# Patient Record
Sex: Female | Born: 1950
Health system: Southern US, Community
[De-identification: ages and names within clinical notes are randomized; demographics above are authoritative.]

## PROBLEM LIST (undated history)

## (undated) DIAGNOSIS — C50919 Malignant neoplasm of unspecified site of unspecified female breast: Secondary | ICD-10-CM

## (undated) DIAGNOSIS — I272 Pulmonary hypertension, unspecified: Secondary | ICD-10-CM

## (undated) DIAGNOSIS — I10 Essential (primary) hypertension: Secondary | ICD-10-CM

## (undated) DIAGNOSIS — E119 Type 2 diabetes mellitus without complications: Secondary | ICD-10-CM

## (undated) HISTORY — PX: KNEE SURGERY: SHX244

## (undated) HISTORY — DX: Pulmonary hypertension, unspecified: I27.20

## (undated) HISTORY — PX: APPENDECTOMY: SHX54

## (undated) HISTORY — PX: HERNIA REPAIR: SHX51

## (undated) HISTORY — PX: ELBOW SURGERY: SHX618

## (undated) HISTORY — PX: ABDOMINAL HYSTERECTOMY: SHX81

## (undated) HISTORY — PX: BREAST LUMPECTOMY: SHX2

---

## 2009-03-24 ENCOUNTER — Ambulatory Visit: Payer: Self-pay | Admitting: Diagnostic Radiology

## 2009-03-24 ENCOUNTER — Emergency Department (HOSPITAL_BASED_OUTPATIENT_CLINIC_OR_DEPARTMENT_OTHER): Admission: EM | Admit: 2009-03-24 | Discharge: 2009-03-24 | Payer: Self-pay | Admitting: Emergency Medicine

## 2010-04-05 ENCOUNTER — Emergency Department (HOSPITAL_BASED_OUTPATIENT_CLINIC_OR_DEPARTMENT_OTHER): Admission: EM | Admit: 2010-04-05 | Discharge: 2010-04-06 | Payer: Self-pay | Admitting: Orthopedic Surgery

## 2010-04-06 ENCOUNTER — Ambulatory Visit: Payer: Self-pay | Admitting: Diagnostic Radiology

## 2010-04-09 ENCOUNTER — Encounter: Admission: RE | Admit: 2010-04-09 | Discharge: 2010-04-09 | Payer: Self-pay | Admitting: Orthopedic Surgery

## 2010-05-31 ENCOUNTER — Encounter: Admission: RE | Admit: 2010-05-31 | Discharge: 2010-08-23 | Payer: Self-pay | Admitting: Orthopedic Surgery

## 2011-03-19 LAB — DIFFERENTIAL
Basophils Relative: 1 % (ref 0–1)
Eosinophils Absolute: 0.2 10*3/uL (ref 0.0–0.7)
Lymphocytes Relative: 35 % (ref 12–46)
Lymphs Abs: 2.5 10*3/uL (ref 0.7–4.0)
Monocytes Absolute: 0.5 10*3/uL (ref 0.1–1.0)
Neutro Abs: 3.8 10*3/uL (ref 1.7–7.7)
Neutrophils Relative %: 55 % (ref 43–77)

## 2011-03-19 LAB — COMPREHENSIVE METABOLIC PANEL
BUN: 16 mg/dL (ref 6–23)
CO2: 28 mEq/L (ref 19–32)
Chloride: 106 mEq/L (ref 96–112)
GFR calc non Af Amer: >60 mL/min (ref >60)
Glucose, Bld: 124 mg/dL — ABNORMAL HIGH (ref 70–99)
Potassium: 3.6 mEq/L (ref 3.5–5.1)
Sodium: 143 mEq/L (ref 135–145)
Total Bilirubin: 0.6 mg/dL (ref 0.3–1.2)

## 2011-03-19 LAB — POCT CARDIAC MARKERS

## 2011-03-19 LAB — CBC
MCHC: 33.1 g/dL (ref 30.0–36.0)
Platelets: 307 10*3/uL (ref 150–400)
RDW: 12.9 % (ref 11.5–15.5)

## 2012-11-12 ENCOUNTER — Emergency Department (HOSPITAL_BASED_OUTPATIENT_CLINIC_OR_DEPARTMENT_OTHER)
Admission: EM | Admit: 2012-11-12 | Discharge: 2012-11-12 | Disposition: A | Discharge status: Home / Self Care | Payer: Managed Care, Other (non HMO) | Attending: Emergency Medicine | Admitting: Emergency Medicine

## 2012-11-12 ENCOUNTER — Emergency Department (HOSPITAL_BASED_OUTPATIENT_CLINIC_OR_DEPARTMENT_OTHER): Payer: Managed Care, Other (non HMO)

## 2012-11-12 ENCOUNTER — Encounter (HOSPITAL_BASED_OUTPATIENT_CLINIC_OR_DEPARTMENT_OTHER): Payer: Self-pay | Admitting: *Deleted

## 2012-11-12 DIAGNOSIS — Z853 Personal history of malignant neoplasm of breast: Secondary | ICD-10-CM | POA: Insufficient documentation

## 2012-11-12 DIAGNOSIS — I10 Essential (primary) hypertension: Secondary | ICD-10-CM | POA: Insufficient documentation

## 2012-11-12 DIAGNOSIS — Z79899 Other long term (current) drug therapy: Secondary | ICD-10-CM | POA: Insufficient documentation

## 2012-11-12 DIAGNOSIS — R42 Dizziness and giddiness: Secondary | ICD-10-CM | POA: Insufficient documentation

## 2012-11-12 DIAGNOSIS — E119 Type 2 diabetes mellitus without complications: Secondary | ICD-10-CM | POA: Insufficient documentation

## 2012-11-12 DIAGNOSIS — Z9889 Other specified postprocedural states: Secondary | ICD-10-CM | POA: Insufficient documentation

## 2012-11-12 HISTORY — DX: Malignant neoplasm of unspecified site of unspecified female breast: C50.919

## 2012-11-12 HISTORY — DX: Type 2 diabetes mellitus without complications: E11.9

## 2012-11-12 HISTORY — DX: Essential (primary) hypertension: I10

## 2012-11-12 LAB — COMPREHENSIVE METABOLIC PANEL
BUN: 11 mg/dL (ref 6–23)
CO2: 25 mEq/L (ref 19–32)
Calcium: 8.8 mg/dL (ref 8.4–10.5)
Chloride: 104 mEq/L (ref 96–112)
Creatinine, Ser: 0.8 mg/dL (ref 0.50–1.10)
Glucose, Bld: 166 mg/dL — ABNORMAL HIGH (ref 70–99)
Total Protein: 6.9 g/dL (ref 6.0–8.3)

## 2012-11-12 LAB — CBC WITH DIFFERENTIAL/PLATELET
Basophils Absolute: 0 10*3/uL (ref 0.0–0.1)
Basophils Relative: 0 % (ref 0–1)
HCT: 39.3 % (ref 36.0–46.0)
Lymphocytes Relative: 36 % (ref 12–46)
Lymphs Abs: 2.4 10*3/uL (ref 0.7–4.0)
MCH: 31.5 pg (ref 26.0–34.0)
MCHC: 35.1 g/dL (ref 30.0–36.0)
MCV: 89.7 fL (ref 78.0–100.0)
Neutro Abs: 3.6 10*3/uL (ref 1.7–7.7)
Neutrophils Relative %: 54 % (ref 43–77)
Platelets: 280 10*3/uL (ref 150–400)
RBC: 4.38 MIL/uL (ref 3.87–5.11)
RDW: 12.6 % (ref 11.5–15.5)

## 2012-11-12 LAB — TROPONIN I: Troponin I: <0.3 ng/mL (ref <0.30)

## 2012-11-12 MED ORDER — METOCLOPRAMIDE HCL 5 MG/ML IJ SOLN
10.0000 mg | Freq: Once | INTRAMUSCULAR | Status: AC
  Administered 2012-11-12: 10 mg via INTRAVENOUS
  Filled 2012-11-12: qty 2

## 2012-11-12 MED ORDER — MECLIZINE HCL 50 MG PO TABS: 50.0000 mg | ORAL_TABLET | Freq: Three times a day (TID) | ORAL | Status: DC | PRN

## 2012-11-12 MED ORDER — MECLIZINE HCL 25 MG PO TABS
25.0000 mg | ORAL_TABLET | Freq: Once | ORAL | Status: AC
  Administered 2012-11-12: 25 mg via ORAL
  Filled 2012-11-12: qty 1

## 2012-11-12 MED ORDER — SODIUM CHLORIDE 0.9 % IV BOLUS (SEPSIS)
1000.0000 mL | Freq: Once | INTRAVENOUS | Status: AC
  Administered 2012-11-12: 1000 mL via INTRAVENOUS

## 2012-11-12 NOTE — ED Notes (Signed)
Woke with dizziness and stumbling. Walked to the bathroom and then to [employer]. Sat in a chair until she felt better. Dizziness continues. States she feels like a "bobble head" at present. Hx of similar episode last year at same time of year that was treated with unknown medication for dizziness after CT and MRI were negative. States she is raising her 62 year old granddaughter and deals with the stress of a pre-teen.

## 2012-11-12 NOTE — ED Notes (Signed)
MD at bedside. 

## 2012-11-12 NOTE — ED Provider Notes (Signed)
History     CSN: [id number]  Arrival date & time 11/12/12  1746   First MD Initiated Contact with Patient 11/12/12 1824      Chief Complaint  Patient presents with  . Headache  . Dizziness    (Consider location/radiation/quality/duration/timing/severity/associated sxs/prior treatment) The history is provided by the patient.  Epifania Littrell is a 61 y.o. female hx of DM, HTN, vertigo here with headache and dizziness. She woke up this morning and felt dizzy and lightheaded. She felt like she was going to pass out but didn't. She also had a sense of room spinning. She has a slight headache as well. Denies chest pain or shortness of breath or vomiting. She had a similar episode a year ago and went to [facility] and had nl CT head and was discharged with medicine for dizziness. She was out of work for 3 months afterwards due to the dizziness. Denies numbness or weakness.    Past Medical History  Diagnosis Date  . Hypertension   . Diabetes mellitus without complication   . Breast cancer     Past Surgical History  Procedure Date  . Abdominal hysterectomy   . Appendectomy   . Elbow surgery   . Knee surgery   . Breast lumpectomy   . Hernia repair     No family history on file.  History  Substance Use Topics  . Smoking status: Never Smoker   . Smokeless tobacco: Never Used  . Alcohol Use: No    OB History    Grav Para Term Preterm Abortions TAB SAB Ect Mult Living                  Review of Systems  Neurological: Positive for dizziness and light-headedness.  All other systems reviewed and are negative.    Allergies  Review of patient's allergies indicates no known allergies.  Home Medications   Current Outpatient Rx  Name  Route  Sig  Dispense  Refill  . LOSARTAN POTASSIUM PO   Oral   Take by mouth.         . METFORMIN HCL PO   Oral   Take by mouth.           BP 156/79  Pulse 62  Temp 98.9 F (37.2 C) (Oral)  Resp 18  Ht 5' 5.5" (1.664  m)  Wt 278 lb (126.1 kg)  BMI 45.56 kg/m2  SpO2 100%  Physical Exam  Nursing note and vitals reviewed. Constitutional: She is oriented to person, place, and time. She appears well-developed.       Uncomfortable   HENT:  Head: Normocephalic.  Mouth/Throat: Oropharynx is clear and moist.  Eyes: Conjunctivae normal are normal. Pupils are equal, round, and reactive to light.       No nystagmus   Neck: Normal range of motion. Neck supple.  Cardiovascular: Normal rate, regular rhythm and normal heart sounds.   Pulmonary/Chest: Effort normal and breath sounds normal. No respiratory distress. She has no wheezes. She has no rales.  Abdominal: Soft. Bowel sounds are normal. She exhibits no distension. There is no tenderness. There is no rebound.  Musculoskeletal: Normal range of motion. She exhibits no edema.  Neurological: She is alert and oriented to person, place, and time.       Nl finger to nose, no pronator drift. NL gait but had trouble with tandem walk. Neg rhomberg sign.   Skin: Skin is warm and dry.  Psychiatric: She has a normal  mood and affect. Her behavior is normal. Judgment and thought content normal.    ED Course  Procedures (including critical care time)  Labs Reviewed  COMPREHENSIVE METABOLIC PANEL - Abnormal; Notable for the following:    Glucose, Bld 166 (*)     Albumin 3.3 (*)     Alkaline Phosphatase 134 (*)     GFR calc non Af Amer 77 (*)     GFR calc Af Amer 90 (*)     All other components within normal limits  CBC WITH DIFFERENTIAL  TROPONIN I  TROPONIN I   Ct Head Wo Contrast  11/12/2012  *RADIOLOGY REPORT*  Clinical Data: Headache, dizziness and unsteady gait.  History of hypertension, diabetes and breast cancer.  CT HEAD WITHOUT CONTRAST  Technique:  Contiguous axial images were obtained from the base of the skull through the vertex without contrast.  Comparison: None.  Findings: Normal appearing cerebral hemispheres and posterior fossa structures.  Normal  size and position of the ventricles.  No intracranial hemorrhage, mass lesion or CT evidence of acute infarction.  Bilateral hyperostosis frontalis.  The included paranasal sinuses are normally pneumatized.  IMPRESSION: No acute abnormality.   Original Report Authenticated By: Beckie Salts, M.D.      No diagnosis found.   Date: 11/12/2012  Rate: 61  Rhythm: normal sinus rhymn with 1st degree AV block  QRS Axis: normal  Intervals: normal  ST/T Wave abnormalities: normal  Conduction Disutrbances:none  Narrative Interpretation: + PVC that is not present in prior   Old EKG Reviewed: changes noted     MDM  Freida Nebel is a 61 y.o. female here with dizziness, lightheadedness. Likely has vertigo. But will consider ACS given hx of DM. Will do CT head, labs, EKG, trop x 2 and reassess.    10:57 PM Patient felt better. CT head nl. Labs nl. Trop neg x 2. Likely peripheral vertigo. Will d/c home on meclizine.         Richardean Canal, MD 11/12/12 2258

## 2012-11-12 NOTE — ED Notes (Signed)
Pt reports she woke up this morning with headache and dizziness- states she has "been stumbling around"- states she felt fine when she went to bed last night at 2230- grips equal, facial symmetry present- speech clear- ambulated without difficulty to triage

## 2014-04-14 DIAGNOSIS — I10 Essential (primary) hypertension: Secondary | ICD-10-CM | POA: Insufficient documentation

## 2014-04-21 ENCOUNTER — Emergency Department (HOSPITAL_BASED_OUTPATIENT_CLINIC_OR_DEPARTMENT_OTHER)
Admission: EM | Admit: 2014-04-21 | Discharge: 2014-04-21 | Disposition: A | Discharge status: Home / Self Care | Payer: Managed Care, Other (non HMO) | Attending: Emergency Medicine | Admitting: Emergency Medicine

## 2014-04-21 ENCOUNTER — Encounter (HOSPITAL_BASED_OUTPATIENT_CLINIC_OR_DEPARTMENT_OTHER): Payer: Self-pay | Admitting: Emergency Medicine

## 2014-04-21 DIAGNOSIS — E119 Type 2 diabetes mellitus without complications: Secondary | ICD-10-CM | POA: Insufficient documentation

## 2014-04-21 DIAGNOSIS — R1110 Vomiting, unspecified: Secondary | ICD-10-CM

## 2014-04-21 DIAGNOSIS — I10 Essential (primary) hypertension: Secondary | ICD-10-CM | POA: Insufficient documentation

## 2014-04-21 DIAGNOSIS — Z853 Personal history of malignant neoplasm of breast: Secondary | ICD-10-CM | POA: Insufficient documentation

## 2014-04-21 DIAGNOSIS — R112 Nausea with vomiting, unspecified: Secondary | ICD-10-CM | POA: Insufficient documentation

## 2014-04-21 LAB — COMPREHENSIVE METABOLIC PANEL
ALBUMIN: 3.4 g/dL — AB (ref 3.5–5.2)
ALK PHOS: 121 U/L — AB (ref 39–117)
ALT: 19 U/L (ref 0–35)
AST: 19 U/L (ref 0–37)
BILIRUBIN TOTAL: 0.3 mg/dL (ref 0.3–1.2)
BUN: 14 mg/dL (ref 6–23)
CO2: 27 mEq/L (ref 19–32)
Calcium: 9.1 mg/dL (ref 8.4–10.5)
Chloride: 102 mEq/L (ref 96–112)
Creatinine, Ser: 0.8 mg/dL (ref 0.50–1.10)
GFR calc Af Amer: 89 mL/min — ABNORMAL LOW (ref >90)
GFR, EST NON AFRICAN AMERICAN: 77 mL/min — AB (ref >90)
GLUCOSE: 310 mg/dL — AB (ref 70–99)
Potassium: 4.3 mEq/L (ref 3.7–5.3)
SODIUM: 140 meq/L (ref 137–147)
Total Protein: 6.6 g/dL (ref 6.0–8.3)

## 2014-04-21 LAB — URINALYSIS, ROUTINE W REFLEX MICROSCOPIC
BILIRUBIN URINE: NEGATIVE
Glucose, UA: >1000 mg/dL — AB
Hgb urine dipstick: NEGATIVE
Ketones, ur: NEGATIVE mg/dL
Leukocytes, UA: NEGATIVE
Nitrite: NEGATIVE
Protein, ur: NEGATIVE mg/dL
Specific Gravity, Urine: 1.024 (ref 1.005–1.030)
UROBILINOGEN UA: 1 mg/dL (ref 0.0–1.0)
pH: 6 (ref 5.0–8.0)

## 2014-04-21 LAB — URINE MICROSCOPIC-ADD ON

## 2014-04-21 LAB — CBC WITH DIFFERENTIAL/PLATELET
BASOS PCT: 0 % (ref 0–1)
Basophils Absolute: 0 10*3/uL (ref 0.0–0.1)
Eosinophils Absolute: 0.1 10*3/uL (ref 0.0–0.7)
Eosinophils Relative: 1 % (ref 0–5)
HCT: 42.7 % (ref 36.0–46.0)
HEMOGLOBIN: 14.9 g/dL (ref 12.0–15.0)
LYMPHS PCT: 35 % (ref 12–46)
Lymphs Abs: 1.6 10*3/uL (ref 0.7–4.0)
MCH: 32.5 pg (ref 26.0–34.0)
MCHC: 34.9 g/dL (ref 30.0–36.0)
MCV: 93.2 fL (ref 78.0–100.0)
MONO ABS: 0.3 10*3/uL (ref 0.1–1.0)
Monocytes Relative: 7 % (ref 3–12)
NEUTROS ABS: 2.6 10*3/uL (ref 1.7–7.7)
NEUTROS PCT: 57 % (ref 43–77)
PLATELETS: 228 10*3/uL (ref 150–400)
RBC: 4.58 MIL/uL (ref 3.87–5.11)
RDW: 12.3 % (ref 11.5–15.5)
WBC: 4.5 10*3/uL (ref 4.0–10.5)

## 2014-04-21 LAB — CBG MONITORING, ED: GLUCOSE-CAPILLARY: 270 mg/dL — AB (ref 70–99)

## 2014-04-21 LAB — LIPASE, BLOOD: Lipase: 19 U/L (ref 11–59)

## 2014-04-21 MED ORDER — ONDANSETRON 4 MG PO TBDP: ORAL_TABLET | ORAL | Status: DC

## 2014-04-21 NOTE — ED Notes (Addendum)
Pt reports N/V that started this morning.  Reports weakness and shakiness after vomiting.  Denies diarrhea.

## 2014-04-21 NOTE — Discharge Instructions (Signed)

## 2014-04-21 NOTE — ED Provider Notes (Signed)
CSN: [id number]     Arrival date & time 04/21/14  1000 History   First MD Initiated Contact with Patient 04/21/14 1042     Chief Complaint  Patient presents with  . Nausea     (Consider location/radiation/quality/duration/timing/severity/associated sxs/prior Treatment) HPI Comments: Patient with a history of hypertension and diabetes presents with nausea and vomiting. She states that happened earlier today and is been ongoing throughout the morning. She's had some loose stools since yesterday but no frequent diarrhea. She denies abdominal pain. She denies any fevers or chills. She denies any chest pain or shortness of breath. She denies any urinary symptoms. She denies any history of abdominal problems in the past. She denies a known sick contacts. She's not taking anything for at home.   Past Medical History  Diagnosis Date  . Hypertension   . Diabetes mellitus without complication   . Breast cancer    Past Surgical History  Procedure Laterality Date  . Abdominal hysterectomy    . Appendectomy    . Elbow surgery    . Knee surgery    . Breast lumpectomy    . Hernia repair     History reviewed. No pertinent family history. History  Substance Use Topics  . Smoking status: Never Smoker   . Smokeless tobacco: Never Used  . Alcohol Use: No   OB History   Grav Para Term Preterm Abortions TAB SAB Ect Mult Living                 Review of Systems  Constitutional: Negative for fever, chills, diaphoresis and fatigue.  HENT: Negative for congestion, rhinorrhea and sneezing.   Eyes: Negative.   Respiratory: Negative for cough, chest tightness and shortness of breath.   Cardiovascular: Negative for chest pain and leg swelling.  Gastrointestinal: Positive for nausea and vomiting. Negative for abdominal pain, diarrhea and blood in stool.  Genitourinary: Negative for frequency, hematuria, flank pain and difficulty urinating.  Musculoskeletal: Negative for arthralgias and back pain.   Skin: Negative for rash.  Neurological: Negative for dizziness, speech difficulty, weakness, numbness and headaches.      Allergies  Review of patient's allergies indicates no known allergies.  Home Medications   Prior to Admission medications   Medication Sig Start Date End Date Taking? Authorizing Provider  LOSARTAN POTASSIUM PO Take by mouth.    Historical Provider, MD  METFORMIN HCL PO Take by mouth.    Historical Provider, MD   BP 165/76  Pulse 66  Temp(Src) 98 F (36.7 C) (Oral)  Resp 18  Ht 5\' 5"  (1.651 m)  Wt 270 lb (122.471 kg)  BMI 44.93 kg/m2  SpO2 100% Physical Exam  Constitutional: She is oriented to person, place, and time. She appears well-developed and well-nourished.  HENT:  Head: Normocephalic and atraumatic.  Eyes: Pupils are equal, round, and reactive to light.  Neck: Normal range of motion. Neck supple.  Cardiovascular: Normal rate, regular rhythm and normal heart sounds.   Pulmonary/Chest: Effort normal and breath sounds normal. No respiratory distress. She has no wheezes. She has no rales. She exhibits no tenderness.  Abdominal: Soft. Bowel sounds are normal. There is no tenderness. There is no rebound and no guarding.  Musculoskeletal: Normal range of motion. She exhibits no edema.  Lymphadenopathy:    She has no cervical adenopathy.  Neurological: She is alert and oriented to person, place, and time.  Skin: Skin is warm and dry. No rash noted.  Psychiatric: She has a normal mood  and affect.    ED Course  Procedures (including critical care time) Labs Review Labs Reviewed  COMPREHENSIVE METABOLIC PANEL - Abnormal; Notable for the following:    Glucose, Bld 310 (*)    Albumin 3.4 (*)    Alkaline Phosphatase 121 (*)    GFR calc non Af Amer 77 (*)    GFR calc Af Amer 89 (*)    All other components within normal limits  URINALYSIS, ROUTINE W REFLEX MICROSCOPIC - Abnormal; Notable for the following:    Glucose, UA >1000 (*)    All other  components within normal limits  CBG MONITORING, ED - Abnormal; Notable for the following:    Glucose-Capillary 270 (*)    All other components within normal limits  CBC WITH DIFFERENTIAL  LIPASE, BLOOD  URINE MICROSCOPIC-ADD ON    Imaging Review No results found.   EKG Interpretation   Date/Time:  Friday Apr 21 2014 11:18:46 EDT Ventricular Rate:  59 PR Interval:  224 QRS Duration: 90 QT Interval:  436 QTC Calculation: 431 R Axis:   65 Text Interpretation:  Sinus bradycardia with sinus arrhythmia with 1st  degree A-V block Otherwise normal ECG since last tracing no significant  change Confirmed by Josehua Hammar  MD, Rajvi Armentor ([id number]) on 04/21/2014 1:17:26 PM      MDM   Final diagnoses:  Vomiting    Pt feeling much better after IVFs, zofran.  Tolerating PO fluids, no abd pain.  Discharged with rx for zofran.    Malvin Johns, MD 04/21/14 [phone]

## 2014-07-19 ENCOUNTER — Emergency Department (HOSPITAL_BASED_OUTPATIENT_CLINIC_OR_DEPARTMENT_OTHER)
Admission: EM | Admit: 2014-07-19 | Discharge: 2014-07-19 | Disposition: A | Discharge status: Home / Self Care | Payer: Managed Care, Other (non HMO) | Attending: Emergency Medicine | Admitting: Emergency Medicine

## 2014-07-19 ENCOUNTER — Encounter (HOSPITAL_BASED_OUTPATIENT_CLINIC_OR_DEPARTMENT_OTHER): Payer: Self-pay | Admitting: Emergency Medicine

## 2014-07-19 DIAGNOSIS — Z853 Personal history of malignant neoplasm of breast: Secondary | ICD-10-CM | POA: Insufficient documentation

## 2014-07-19 DIAGNOSIS — E119 Type 2 diabetes mellitus without complications: Secondary | ICD-10-CM | POA: Insufficient documentation

## 2014-07-19 DIAGNOSIS — I10 Essential (primary) hypertension: Secondary | ICD-10-CM | POA: Diagnosis not present

## 2014-07-19 DIAGNOSIS — T4995XA Adverse effect of unspecified topical agent, initial encounter: Secondary | ICD-10-CM | POA: Diagnosis not present

## 2014-07-19 DIAGNOSIS — T7840XA Allergy, unspecified, initial encounter: Secondary | ICD-10-CM

## 2014-07-19 DIAGNOSIS — R21 Rash and other nonspecific skin eruption: Secondary | ICD-10-CM | POA: Diagnosis present

## 2014-07-19 DIAGNOSIS — Z79899 Other long term (current) drug therapy: Secondary | ICD-10-CM | POA: Insufficient documentation

## 2014-07-19 MED ORDER — FAMOTIDINE 20 MG PO TABS: 20.0000 mg | ORAL_TABLET | Freq: Two times a day (BID) | ORAL | Status: DC

## 2014-07-19 MED ORDER — PREDNISONE 20 MG PO TABS: ORAL_TABLET | ORAL | Status: DC

## 2014-07-19 MED ORDER — FAMOTIDINE 20 MG PO TABS
20.0000 mg | ORAL_TABLET | Freq: Once | ORAL | Status: AC
  Administered 2014-07-19: 20 mg via ORAL
  Filled 2014-07-19: qty 1

## 2014-07-19 MED ORDER — PREDNISONE 50 MG PO TABS
60.0000 mg | ORAL_TABLET | Freq: Once | ORAL | Status: AC
  Administered 2014-07-19: 60 mg via ORAL
  Filled 2014-07-19 (×2): qty 1

## 2014-07-19 NOTE — Discharge Instructions (Signed)
Allergies  Allergies may happen from anything your body is sensitive to. This may be food, medicines, pollens, chemicals, and many other things. Food allergies can be severe and deadly.  HOME CARE  If you do not know what causes a reaction, keep a diary. Write down the foods you ate and the symptoms that followed. Avoid foods that cause reactions.  If you have red raised spots (hives) or a rash:  Take medicine as told by your doctor.  Use medicines for red raised spots and itching as needed.  Apply cold cloths (compresses) to the skin. Take a cool bath. Avoid hot baths or showers.  If you are severely allergic:  It is often necessary to go to the hospital after you have treated your reaction.  Wear your medical alert jewelry.  You and your family must learn how to give a allergy shot or use an allergy kit (anaphylaxis kit).  Always carry your allergy kit or shot with you. Use this medicine as told by your doctor if a severe reaction is occurring. GET HELP RIGHT AWAY IF:  You have trouble breathing or are making high-pitched whistling sounds (wheezing).  You have a tight feeling in your chest or throat.  You have a puffy (swollen) mouth.  You have red raised spots, puffiness (swelling), or itching all over your body.  You have had a severe reaction that was helped by your allergy kit or shot. The reaction can return once the medicine has worn off.  You think you are having a food allergy. Symptoms most often happen within 30 minutes of eating a food.  Your symptoms have not gone away within 2 days or are getting worse.  You have new symptoms.  You want to retest yourself with a food or drink you think causes an allergic reaction. Only do this under the care of a doctor. MAKE SURE YOU:   Understand these instructions.  Will watch your condition.  Will get help right away if you are not doing well or get worse. Document Released: 03/21/2013 Document Reviewed:  03/21/2013 [facility] Patient Information 2015 [place]. This information is not intended to replace advice given to you by your health care provider. Make sure you discuss any questions you have with your health care provider.

## 2014-07-19 NOTE — ED Provider Notes (Signed)
CSN: [id number]     Arrival date & time 07/19/14  2212 History  This chart was scribed for Ila Landowski Alfonso Patten, MD by Einar Pheasant, ED Scribe. This patient was seen in room MH05/MH05 and the patient's care was started at 11:31 PM.     Chief Complaint  Patient presents with  . Rash   Patient is a 63 y.o. female presenting with rash. The history is provided by the patient. No language interpreter was used.  Rash Location:  Face and shoulder/arm (scalp) Quality: itchiness and redness   Quality: not blistering, not draining and not swelling   Severity:  Mild Onset quality:  Sudden Duration:  1 hour Progression:  Partially resolved Chronicity:  New Context: not animal contact, not plant contact, not pollen and not sick contacts   Relieved by:  Antihistamines Worsened by:  Nothing tried Ineffective treatments:  Antihistamines Associated symptoms: no abdominal pain, no fever, no nausea, no shortness of breath, no throat swelling, no tongue swelling, not vomiting and not wheezing    HPI Comments: Katherine Pratt is a 63 y.o. female who presents to the Emergency Department complaining of a worsening rash that started this morning upon waking. Pt states that her scalp started itching this morning so she took Benadryl which relieved her itching 07/08/14. About an hour PTA pt broke out in a red facial rash. She states that the rash is also located on her upper body and she is also experiencing "uncontrollable itching". Pt reports taking Benadryl with her last dose being 30 minutes ago. She states that the itching has resolved, but the red rash to her face and upper body is still present. Denies any fever, chills, nausea, emesis, headache, of headache. She denies being exposed to any new chemicals or object.  Past Medical History  Diagnosis Date  . Hypertension   . Diabetes mellitus without complication   . Breast cancer    Past Surgical History  Procedure Laterality Date  . Abdominal  hysterectomy    . Appendectomy    . Elbow surgery    . Knee surgery    . Breast lumpectomy    . Hernia repair     No family history on file. History  Substance Use Topics  . Smoking status: Never Smoker   . Smokeless tobacco: Never Used  . Alcohol Use: No   OB History   Grav Para Term Preterm Abortions TAB SAB Ect Mult Living                 Review of Systems  Constitutional: Negative for fever and chills.  HENT: Negative for congestion.   Respiratory: Negative for cough, shortness of breath and wheezing.   Gastrointestinal: Negative for nausea, vomiting and abdominal pain.  Skin: Positive for rash.  All other systems reviewed and are negative.   Allergies  Review of patient's allergies indicates no known allergies.  Home Medications   Prior to Admission medications   Medication Sig Start Date End Date Taking? Authorizing Provider  LOSARTAN POTASSIUM PO Take by mouth.    Historical Provider, MD  METFORMIN HCL PO Take by mouth.    Historical Provider, MD  ondansetron (ZOFRAN ODT) 4 MG disintegrating tablet 4mg  ODT q4 hours prn nausea/vomit 04/21/14   Malvin Johns, MD   BP 185/72  Pulse 85  Temp(Src) 98.3 F (36.8 C) (Oral)  Resp 16  Ht 5\' 5"  (1.651 m)  Wt 279 lb (126.554 kg)  BMI 46.43 kg/m2  SpO2 99%  Physical Exam  Nursing note and vitals reviewed. Constitutional: She is oriented to person, place, and time. She appears well-developed and well-nourished. No distress.  HENT:  Head: Normocephalic and atraumatic.  Mouth/Throat: Oropharynx is clear and moist. No oropharyngeal exudate.  No swelling of the uvula tongue or lips.  Eyes: Conjunctivae are normal. Pupils are equal, round, and reactive to light. Right eye exhibits no discharge. Left eye exhibits no discharge.  Neck: Normal range of motion. Neck supple.  Cardiovascular: Normal rate, regular rhythm and normal heart sounds.  Exam reveals no gallop and no friction rub.   No murmur heard. Pulmonary/Chest:  Effort normal and breath sounds normal. No stridor. No respiratory distress. She has no wheezes. She has no rales.  Abdominal: Soft. Bowel sounds are normal. She exhibits no distension. There is no tenderness. There is no rebound and no guarding.  Musculoskeletal: Normal range of motion. She exhibits no edema and no tenderness.  Neurological: She is alert and oriented to person, place, and time.  Skin: Skin is warm and dry. Rash noted.  Mild Red blotches to the skin, but not wheeping. No rash to the palms and soles. No urticaria  Psychiatric: She has a normal mood and affect. Her behavior is normal. Thought content normal.    ED Course  Procedures (including critical care time)  DIAGNOSTIC STUDIES: Oxygen Saturation is 99% on RA, normal by my interpretation.    COORDINATION OF CARE: 11:34 PM- Advised pt to follow up with her PCP. Pt advised of plan for treatment and pt agrees.  Labs Review Labs Reviewed - No data to display  Imaging Review No results found.   EKG Interpretation None      MDM   Final diagnoses:  None    Medications  famotidine (PEPCID) tablet 20 mg (20 mg Oral Given 07/19/14 2309)  predniSONE (DELTASONE) tablet 60 mg (60 mg Oral Given 07/19/14 2310)     Will prescribe prednisone, benadryl every 6 hours and pepcid.  Return for swelling of the lips or tongue or face or shortness of breath.  Follow up with your family doctor for recheck  I personally performed the services described in this documentation, which was scribed in my presence. The recorded information has been reviewed and is accurate.    Carlisle Beers, MD 07/20/14 [id number]

## 2014-07-19 NOTE — ED Notes (Signed)
Scalp started itching this morning and pt took benadryl and it stopped.  About an hour ago pt broke out in a red rash on face and upper body with "uncontrollable itching." Took 1 benadryl an hour ago and another 30 min ago.  Itching has stopped but rash is still there.

## 2014-07-20 ENCOUNTER — Encounter (HOSPITAL_BASED_OUTPATIENT_CLINIC_OR_DEPARTMENT_OTHER): Payer: Self-pay | Admitting: Emergency Medicine

## 2014-10-09 ENCOUNTER — Emergency Department (HOSPITAL_BASED_OUTPATIENT_CLINIC_OR_DEPARTMENT_OTHER): Payer: Managed Care, Other (non HMO)

## 2014-10-09 ENCOUNTER — Encounter (HOSPITAL_BASED_OUTPATIENT_CLINIC_OR_DEPARTMENT_OTHER): Payer: Self-pay

## 2014-10-09 ENCOUNTER — Emergency Department (HOSPITAL_BASED_OUTPATIENT_CLINIC_OR_DEPARTMENT_OTHER)
Admission: EM | Admit: 2014-10-09 | Discharge: 2014-10-09 | Disposition: A | Discharge status: Home / Self Care | Payer: Managed Care, Other (non HMO) | Attending: Emergency Medicine | Admitting: Emergency Medicine

## 2014-10-09 DIAGNOSIS — I10 Essential (primary) hypertension: Secondary | ICD-10-CM | POA: Diagnosis not present

## 2014-10-09 DIAGNOSIS — R42 Dizziness and giddiness: Secondary | ICD-10-CM | POA: Diagnosis not present

## 2014-10-09 DIAGNOSIS — E119 Type 2 diabetes mellitus without complications: Secondary | ICD-10-CM | POA: Insufficient documentation

## 2014-10-09 DIAGNOSIS — Z7952 Long term (current) use of systemic steroids: Secondary | ICD-10-CM | POA: Insufficient documentation

## 2014-10-09 DIAGNOSIS — Z79899 Other long term (current) drug therapy: Secondary | ICD-10-CM | POA: Diagnosis not present

## 2014-10-09 DIAGNOSIS — Z853 Personal history of malignant neoplasm of breast: Secondary | ICD-10-CM | POA: Diagnosis not present

## 2014-10-09 DIAGNOSIS — R202 Paresthesia of skin: Secondary | ICD-10-CM | POA: Insufficient documentation

## 2014-10-09 LAB — CBC WITH DIFFERENTIAL/PLATELET
BASOS ABS: 0 10*3/uL (ref 0.0–0.1)
Basophils Relative: 0 % (ref 0–1)
Eosinophils Absolute: 0.1 10*3/uL (ref 0.0–0.7)
Eosinophils Relative: 2 % (ref 0–5)
HEMATOCRIT: 43 % (ref 36.0–46.0)
HEMOGLOBIN: 15 g/dL (ref 12.0–15.0)
LYMPHS PCT: 43 % (ref 12–46)
Lymphs Abs: 2.4 10*3/uL (ref 0.7–4.0)
MCH: 32.1 pg (ref 26.0–34.0)
MCHC: 34.9 g/dL (ref 30.0–36.0)
MCV: 91.9 fL (ref 78.0–100.0)
MONOS PCT: 9 % (ref 3–12)
Monocytes Absolute: 0.5 10*3/uL (ref 0.1–1.0)
Neutro Abs: 2.6 10*3/uL (ref 1.7–7.7)
Neutrophils Relative %: 46 % (ref 43–77)
Platelets: 262 10*3/uL (ref 150–400)
RBC: 4.68 MIL/uL (ref 3.87–5.11)
RDW: 12.4 % (ref 11.5–15.5)
WBC: 5.5 10*3/uL (ref 4.0–10.5)

## 2014-10-09 LAB — COMPREHENSIVE METABOLIC PANEL
ALK PHOS: 117 U/L (ref 39–117)
ALT: 26 U/L (ref 0–35)
AST: 21 U/L (ref 0–37)
Albumin: 3.4 g/dL — ABNORMAL LOW (ref 3.5–5.2)
Anion gap: 12 (ref 5–15)
BILIRUBIN TOTAL: 0.3 mg/dL (ref 0.3–1.2)
BUN: 10 mg/dL (ref 6–23)
CHLORIDE: 100 meq/L (ref 96–112)
CO2: 27 mEq/L (ref 19–32)
CREATININE: 0.7 mg/dL (ref 0.50–1.10)
Calcium: 9.3 mg/dL (ref 8.4–10.5)
GFR calc Af Amer: >90 mL/min (ref >90)
GFR, EST NON AFRICAN AMERICAN: 90 mL/min — AB (ref >90)
Glucose, Bld: 306 mg/dL — ABNORMAL HIGH (ref 70–99)
Potassium: 4.1 mEq/L (ref 3.7–5.3)
Sodium: 139 mEq/L (ref 137–147)
Total Protein: 6.7 g/dL (ref 6.0–8.3)

## 2014-10-09 LAB — TROPONIN I

## 2014-10-09 LAB — PRO B NATRIURETIC PEPTIDE: Pro B Natriuretic peptide (BNP): 144.7 pg/mL — ABNORMAL HIGH (ref 0–125)

## 2014-10-09 MED ORDER — AMLODIPINE BESYLATE 5 MG PO TABS
5.0000 mg | ORAL_TABLET | Freq: Once | ORAL | Status: AC
Start: 2014-10-09 — End: 2014-10-09
  Administered 2014-10-09: 5 mg via ORAL
  Filled 2014-10-09: qty 1

## 2014-10-09 NOTE — ED Notes (Addendum)
Pt was advised to come to ED due to elevated BP at [facility] [place] states PCP changed BP med approx 2 months ago r/t allergy to losartan

## 2014-10-09 NOTE — ED Notes (Signed)
Patient transported to X-ray 

## 2014-10-09 NOTE — ED Provider Notes (Signed)
CSN: [id number]     Arrival date & time 10/09/14  1454 History  This chart was scribed for Katherine Pratt, * by Tula Nakayama, ED Scribe. This patient was seen in room MH01/MH01 and the patient's care was started at 3:07 PM.     Chief Complaint  Patient presents with  . Hypertension    The history is provided by the patient. No language interpreter was used.    HPI Comments: Katherine Pratt is a 63 y.o. female with a history of HTN and DM who presents to the Emergency Department complaining of tingling of her forehead and around her right eye that started a few hours ago. She states symptoms started after her blood pressure was taken at a health fair and measured higher than baseline. Pt notes mild light-headedness at the time of the reading as an associated symptom. Pt woke up with a HA yesterday and states symptoms improved with Aleve.  Pt reports that her HTN medication was changed 2 months ago because she had adverse reaction to Losartan. Pt denies CP and SOB as associated symptoms.  Past Medical History  Diagnosis Date  . Hypertension   . Diabetes mellitus without complication   . Breast cancer   . Breast cancer    Past Surgical History  Procedure Laterality Date  . Abdominal hysterectomy    . Appendectomy    . Elbow surgery    . Knee surgery    . Breast lumpectomy    . Hernia repair     No family history on file. History  Substance Use Topics  . Smoking status: Never Smoker   . Smokeless tobacco: Never Used  . Alcohol Use: No   OB History    No data available     Review of Systems  Respiratory: Negative for shortness of breath.   Cardiovascular: Negative for chest pain.  Neurological: Positive for light-headedness.  All other systems reviewed and are negative.     Allergies  Losartan  Home Medications   Prior to Admission medications   Medication Sig Start Date End Date Taking? Authorizing Provider  amLODipine (NORVASC) 2.5 MG tablet Take 2.5 mg by  mouth daily.   Yes Historical Provider, MD  famotidine (PEPCID) 20 MG tablet Take 1 tablet (20 mg total) by mouth 2 (two) times daily. 07/19/14   April Alfonso Patten, MD  LOSARTAN POTASSIUM PO Take by mouth.    Historical Provider, MD  METFORMIN HCL PO Take by mouth.    Historical Provider, MD  ondansetron (ZOFRAN ODT) 4 MG disintegrating tablet 4mg  ODT q4 hours prn nausea/vomit 04/21/14   Malvin Johns, MD  predniSONE (DELTASONE) 20 MG tablet 3 tabs po day one, then 2 po daily x 4 days 07/19/14   April K Palumbo-Rasch, MD   BP 144/80 mmHg  Pulse 59  Temp(Src) 98.5 F (36.9 C) (Oral)  Resp 18  Ht 5\' 4"  (1.626 m)  Wt 268 lb (121.564 kg)  BMI 45.98 kg/m2  SpO2 100% Physical Exam  Constitutional: She is oriented to person, place, and time. She appears well-developed and well-nourished. No distress.  HENT:  Head: Normocephalic and atraumatic.  Right Ear: Hearing normal.  Left Ear: Hearing normal.  Nose: Nose normal.  Mouth/Throat: Oropharynx is clear and moist and mucous membranes are normal.  Eyes: Conjunctivae and EOM are normal. Pupils are equal, round, and reactive to light.  Neck: Normal range of motion. Neck supple.  Cardiovascular: Regular rhythm, S1 normal and S2 normal.  Exam reveals  no gallop and no friction rub.   No murmur heard. Pulmonary/Chest: Effort normal and breath sounds normal. No respiratory distress. She exhibits no tenderness.  Abdominal: Soft. Normal appearance and bowel sounds are normal. There is no hepatosplenomegaly. There is no tenderness. There is no rebound, no guarding, no tenderness at McBurney's point and negative Murphy's sign. No hernia.  Musculoskeletal: Normal range of motion.  Neurological: She is alert and oriented to person, place, and time. She has normal strength. No cranial nerve deficit or sensory deficit. Coordination normal. GCS eye subscore is 4. GCS verbal subscore is 5. GCS motor subscore is 6.  Tingling on right forehead and around right  eye  Extraocular muscle movement: normal No visual field cut Pupils: equal and reactive both direct and consensual response is normal No nystagmus present    Sensory function is intact to light touch, pinprick Proprioception intact  Grip strength 5/5 symmetric in upper extremities Lower extremity strength 5/5 against gravity No pronator drift Normal finger to nose bilaterally Normal heel to shin bilaterally  Gait: normal    Skin: Skin is warm, dry and intact. No rash noted. No cyanosis.  Psychiatric: She has a normal mood and affect. Her speech is normal and behavior is normal. Thought content normal.  Nursing note and vitals reviewed.   ED Course  Procedures (including critical care time) DIAGNOSTIC STUDIES: Oxygen Saturation is 100% on RA, normal by my interpretation.    COORDINATION OF CARE: 3:08 PM Discussed treatment plan with pt at bedside and pt agreed to plan.  Labs Review Labs Reviewed  COMPREHENSIVE METABOLIC PANEL - Abnormal; Notable for the following:    Glucose, Bld 306 (*)    Albumin 3.4 (*)    GFR calc non Af Amer 90 (*)    All other components within normal limits  PRO B NATRIURETIC PEPTIDE - Abnormal; Notable for the following:    Pro B Natriuretic peptide (BNP) 144.7 (*)    All other components within normal limits  CBC WITH DIFFERENTIAL  TROPONIN I    Imaging Review Dg Chest 2 View  10/09/2014   CLINICAL DATA:  Dizziness.  EXAM: CHEST  2 VIEW  COMPARISON:  03/24/2009.  FINDINGS: Normal sized heart. Clear lungs. Thoracic spine degenerative changes.  IMPRESSION: No acute abnormality.   Electronically Signed   By: Enrique Sack M.D.   On: 10/09/2014 15:44   Ct Head Wo Contrast  10/09/2014   CLINICAL DATA:  63 year old female with hypertension and tingling on the right side of the face with lightheadedness. Initial encounter.  EXAM: CT HEAD WITHOUT CONTRAST  TECHNIQUE: Contiguous axial images were obtained from the base of the skull through the vertex  without intravenous contrast.  COMPARISON:  Head CT without contrast 11/12/2012.  FINDINGS: Visualized paranasal sinuses and mastoids are clear. No osseous abnormality identified. Visualized orbits and scalp soft tissues are within normal limits.  Stable and normal for age cerebral volume. No midline shift, ventriculomegaly, mass effect, evidence of mass lesion, intracranial hemorrhage or evidence of cortically based acute infarction. Gray-white matter differentiation is within normal limits throughout the brain. No suspicious intracranial vascular hyperdensity.  IMPRESSION: Stable and normal for age non contrast CT appearance of the brain.   Electronically Signed   By: Lars Pinks M.D.   On: 10/09/2014 15:46     EKG Interpretation None      MDM   Final diagnoses:  Dizziness  Essential hypertension   Patient was sent to the ER for evaluation of  elevated blood pressure. She was found to have high blood pressure while at a wellness fair at work. Patient reports that she had an acute allergic reaction to losartan 2 months ago, was started on Norvasc. She has not had her blood pressure checked since. Patient's blood pressure was 184/82 upon arrival to the ER. Patient had some vague sensation of tingling around her right eye. This involves the upper motor neuron and lower motor normal, scribed as the forehead and under the right eye. She did not have any drooping or motor function abnormality. There was no involvement of the lower portion of the face and jaw. No facial droop. Tongue midline. Remainder of neurologic exam is entirely normal. Patient reports that these symptoms began immediately when she noticed High her blood pressure was, not before. I suspect this was some anxiety, not consistent with acute stroke. Patient's workup has been unremarkable. Blood pressure markedly improved after additional Norvasc. Patient will double her Norvasc dose for 1 week. Daily blood pressure checks and follow-up with  her doctor in 1 week for a recheck.  I personally performed the services described in this documentation, which was scribed in my presence. The recorded information has been reviewed and is accurate.      Katherine Greek, MD 10/09/14 [phone]

## 2014-10-09 NOTE — Discharge Instructions (Signed)
Take two Norvasc (amlodipine) tablets daily instead of one. Take your blood pressure once a day and follow-up with your doctor in a week to see if any further changes need to be made.  Hypertension Hypertension, commonly called high blood pressure, is when the force of blood pumping through your arteries is too strong. Your arteries are the blood vessels that carry blood from your heart throughout your body. A blood pressure reading consists of a higher number over a lower number, such as 110/72. The higher number (systolic) is the pressure inside your arteries when your heart pumps. The lower number (diastolic) is the pressure inside your arteries when your heart relaxes. Ideally you want your blood pressure below 120/80. Hypertension forces your heart to work harder to pump blood. Your arteries may become narrow or stiff. Having hypertension puts you at risk for heart disease, stroke, and other problems.  RISK FACTORS Some risk factors for high blood pressure are controllable. Others are not.  Risk factors you cannot control include:   Race. You may be at higher risk if you are African American.  Age. Risk increases with age.  Gender. Men are at higher risk than women before age 58 years. After age 40, women are at higher risk than men. Risk factors you can control include:  Not getting enough exercise or physical activity.  Being overweight.  Getting too much fat, sugar, calories, or salt in your diet.  Drinking too much alcohol. SIGNS AND SYMPTOMS Hypertension does not usually cause signs or symptoms. Extremely high blood pressure (hypertensive crisis) may cause headache, anxiety, shortness of breath, and nosebleed. DIAGNOSIS  To check if you have hypertension, your health care provider will measure your blood pressure while you are seated, with your arm held at the level of your heart. It should be measured at least twice using the same arm. Certain conditions can cause a difference in  blood pressure between your right and left arms. A blood pressure reading that is higher than normal on one occasion does not mean that you need treatment. If one blood pressure reading is high, ask your health care provider about having it checked again. TREATMENT  Treating high blood pressure includes making lifestyle changes and possibly taking medicine. Living a healthy lifestyle can help lower high blood pressure. You may need to change some of your habits. Lifestyle changes may include:  Following the DASH diet. This diet is high in fruits, vegetables, and whole grains. It is low in salt, red meat, and added sugars.  Getting at least 2 hours of brisk physical activity every week.  Losing weight if necessary.  Not smoking.  Limiting alcoholic beverages.  Learning ways to reduce stress. If lifestyle changes are not enough to get your blood pressure under control, your health care provider may prescribe medicine. You may need to take more than one. Work closely with your health care provider to understand the risks and benefits. HOME CARE INSTRUCTIONS  Have your blood pressure rechecked as directed by your health care provider.   Take medicines only as directed by your health care provider. Follow the directions carefully. Blood pressure medicines must be taken as prescribed. The medicine does not work as well when you skip doses. Skipping doses also puts you at risk for problems.   Do not smoke.   Monitor your blood pressure at home as directed by your health care provider. SEEK MEDICAL CARE IF:   You think you are having a reaction to medicines taken.  You have recurrent headaches or feel dizzy.  You have swelling in your ankles.  You have trouble with your vision. SEEK IMMEDIATE MEDICAL CARE IF:  You develop a severe headache or confusion.  You have unusual weakness, numbness, or feel faint.  You have severe chest or abdominal pain.  You vomit repeatedly.  You  have trouble breathing. MAKE SURE YOU:   Understand these instructions.  Will watch your condition.  Will get help right away if you are not doing well or get worse. Document Released: 11/24/2005 Document Revised: 04/10/2014 Document Reviewed: 09/16/2013 [facility] Patient Information 2015 [place], [place]. This information is not intended to replace advice given to you by your health care provider. Make sure you discuss any questions you have with your health care provider.

## 2014-10-09 NOTE — ED Notes (Signed)
MD at bedside. 

## 2015-01-26 ENCOUNTER — Encounter (HOSPITAL_BASED_OUTPATIENT_CLINIC_OR_DEPARTMENT_OTHER): Payer: Self-pay | Admitting: Emergency Medicine

## 2015-01-26 ENCOUNTER — Emergency Department (HOSPITAL_BASED_OUTPATIENT_CLINIC_OR_DEPARTMENT_OTHER)
Admission: EM | Admit: 2015-01-26 | Discharge: 2015-01-26 | Disposition: A | Discharge status: Home / Self Care | Payer: BLUE CROSS/BLUE SHIELD | Attending: Emergency Medicine | Admitting: Emergency Medicine

## 2015-01-26 ENCOUNTER — Emergency Department (HOSPITAL_BASED_OUTPATIENT_CLINIC_OR_DEPARTMENT_OTHER): Payer: BLUE CROSS/BLUE SHIELD

## 2015-01-26 DIAGNOSIS — Z79899 Other long term (current) drug therapy: Secondary | ICD-10-CM | POA: Insufficient documentation

## 2015-01-26 DIAGNOSIS — R109 Unspecified abdominal pain: Secondary | ICD-10-CM | POA: Diagnosis present

## 2015-01-26 DIAGNOSIS — E119 Type 2 diabetes mellitus without complications: Secondary | ICD-10-CM | POA: Insufficient documentation

## 2015-01-26 DIAGNOSIS — I10 Essential (primary) hypertension: Secondary | ICD-10-CM | POA: Diagnosis not present

## 2015-01-26 DIAGNOSIS — R1031 Right lower quadrant pain: Secondary | ICD-10-CM | POA: Diagnosis not present

## 2015-01-26 DIAGNOSIS — Z853 Personal history of malignant neoplasm of breast: Secondary | ICD-10-CM | POA: Insufficient documentation

## 2015-01-26 DIAGNOSIS — Z7952 Long term (current) use of systemic steroids: Secondary | ICD-10-CM | POA: Diagnosis not present

## 2015-01-26 LAB — COMPREHENSIVE METABOLIC PANEL
ALT: 33 U/L (ref 0–35)
ANION GAP: 2 — AB (ref 5–15)
AST: 24 U/L (ref 0–37)
Albumin: 3.4 g/dL — ABNORMAL LOW (ref 3.5–5.2)
Alkaline Phosphatase: 109 U/L (ref 39–117)
BILIRUBIN TOTAL: 0.3 mg/dL (ref 0.3–1.2)
BUN: 17 mg/dL (ref 6–23)
CALCIUM: 8.4 mg/dL (ref 8.4–10.5)
CO2: 28 mmol/L (ref 19–32)
CREATININE: 0.81 mg/dL (ref 0.50–1.10)
Chloride: 104 mmol/L (ref 96–112)
GFR calc non Af Amer: 75 mL/min — ABNORMAL LOW (ref >90)
GFR, EST AFRICAN AMERICAN: 87 mL/min — AB (ref >90)
Glucose, Bld: 243 mg/dL — ABNORMAL HIGH (ref 70–99)
Potassium: 4 mmol/L (ref 3.5–5.1)
SODIUM: 134 mmol/L — AB (ref 135–145)
TOTAL PROTEIN: 6.6 g/dL (ref 6.0–8.3)

## 2015-01-26 LAB — URINALYSIS, ROUTINE W REFLEX MICROSCOPIC
Bilirubin Urine: NEGATIVE
GLUCOSE, UA: 250 mg/dL — AB
Hgb urine dipstick: NEGATIVE
Ketones, ur: NEGATIVE mg/dL
Leukocytes, UA: NEGATIVE
Nitrite: NEGATIVE
PH: 5 (ref 5.0–8.0)
PROTEIN: NEGATIVE mg/dL
Specific Gravity, Urine: 1.014 (ref 1.005–1.030)
Urobilinogen, UA: 1 mg/dL (ref 0.0–1.0)

## 2015-01-26 LAB — CBC WITH DIFFERENTIAL/PLATELET
BASOS PCT: 0 % (ref 0–1)
Basophils Absolute: 0 10*3/uL (ref 0.0–0.1)
EOS ABS: 0.2 10*3/uL (ref 0.0–0.7)
EOS PCT: 3 % (ref 0–5)
HCT: 42.4 % (ref 36.0–46.0)
Hemoglobin: 14.3 g/dL (ref 12.0–15.0)
LYMPHS ABS: 2.9 10*3/uL (ref 0.7–4.0)
Lymphocytes Relative: 39 % (ref 12–46)
MCH: 30.8 pg (ref 26.0–34.0)
MCHC: 33.7 g/dL (ref 30.0–36.0)
MCV: 91.4 fL (ref 78.0–100.0)
MONO ABS: 0.5 10*3/uL (ref 0.1–1.0)
MONOS PCT: 7 % (ref 3–12)
Neutro Abs: 3.9 10*3/uL (ref 1.7–7.7)
Neutrophils Relative %: 51 % (ref 43–77)
Platelets: 251 10*3/uL (ref 150–400)
RBC: 4.64 MIL/uL (ref 3.87–5.11)
RDW: 12.3 % (ref 11.5–15.5)
WBC: 7.5 10*3/uL (ref 4.0–10.5)

## 2015-01-26 MED ORDER — KETOROLAC TROMETHAMINE 30 MG/ML IJ SOLN
30.0000 mg | Freq: Once | INTRAMUSCULAR | Status: AC
  Administered 2015-01-26: 30 mg via INTRAVENOUS
  Filled 2015-01-26: qty 1

## 2015-01-26 MED ORDER — MORPHINE SULFATE 4 MG/ML IJ SOLN
4.0000 mg | Freq: Once | INTRAMUSCULAR | Status: AC
  Administered 2015-01-26: 4 mg via INTRAVENOUS
  Filled 2015-01-26: qty 1

## 2015-01-26 MED ORDER — HYDROCODONE-ACETAMINOPHEN 5-325 MG PO TABS: 1.0000 | ORAL_TABLET | Freq: Four times a day (QID) | ORAL | Status: DC | PRN

## 2015-01-26 NOTE — ED Notes (Signed)
MD at bedside. 

## 2015-01-26 NOTE — ED Notes (Signed)
Patient states that she has had a stinging pain x 2weeks - today it started to be sharp shooting to her right lower quad

## 2015-01-26 NOTE — ED Provider Notes (Signed)
CSN: [id number]     Arrival date & time 01/26/15  2025 History  This chart was scribed for Veryl Speak, MD by Rayfield Citizen, ED Scribe. This patient was seen in room MH03/MH03 and the patient's care was started at 9:04 PM.    Chief Complaint  Patient presents with  . Abdominal Pain   Patient is a 64 y.o. female presenting with abdominal pain. The history is provided by the patient. No language interpreter was used.  Abdominal Pain    HPI Comments: Katherine Pratt is a 65 y.o. female with past medical history of DM, HTN, breast cancer who presents to the Emergency Department complaining of two weeks of intermittent sharp RLQ pain radiating to her right flank, acutely worsening today. Patient reports that normally she is able to "massage" away her pain, but today's pain became constant and did not improve with her normal treatment. Nothing makes symptoms better or worse at this time. She denies bowel or bladder symptoms, denies a history of kidney issues or kidney stones, denies fever.   Previous surgical history includes: abdominal hysterectomy (ovaries removed in addition,15-20 years PTA), appendectomy, hernia repair (left side abdomen).   Past Medical History  Diagnosis Date  . Hypertension   . Diabetes mellitus without complication   . Breast cancer   . Breast cancer    Past Surgical History  Procedure Laterality Date  . Abdominal hysterectomy    . Appendectomy    . Elbow surgery    . Knee surgery    . Breast lumpectomy    . Hernia repair     History reviewed. No pertinent family history. History  Substance Use Topics  . Smoking status: Never Smoker   . Smokeless tobacco: Never Used  . Alcohol Use: No   OB History    No data available     Review of Systems  A complete 10 system review of systems was obtained and all systems are negative except as noted in the HPI and PMH.    Allergies  Losartan  Home Medications   Prior to Admission medications   Medication Sig  Start Date End Date Taking? Authorizing Provider  amLODipine (NORVASC) 2.5 MG tablet Take 2.5 mg by mouth daily.    Historical Provider, MD  famotidine (PEPCID) 20 MG tablet Take 1 tablet (20 mg total) by mouth 2 (two) times daily. 07/19/14   April Alfonso Patten, MD  LOSARTAN POTASSIUM PO Take by mouth.    Historical Provider, MD  METFORMIN HCL PO Take by mouth.    Historical Provider, MD  ondansetron (ZOFRAN ODT) 4 MG disintegrating tablet 4mg  ODT q4 hours prn nausea/vomit 04/21/14   Malvin Johns, MD  predniSONE (DELTASONE) 20 MG tablet 3 tabs po day one, then 2 po daily x 4 days 07/19/14   April K Palumbo-Rasch, MD   BP 179/65 mmHg  Pulse 73  Temp(Src) 98.3 F (36.8 C) (Oral)  Resp 18  Ht 5\' 5"  (1.651 m)  Wt 265 lb (120.203 kg)  BMI 44.10 kg/m2  SpO2 100% Physical Exam  Constitutional: She is oriented to person, place, and time. She appears well-developed and well-nourished. No distress.  HENT:  Head: Normocephalic and atraumatic.  Mouth/Throat: Oropharynx is clear and moist. No oropharyngeal exudate.  Eyes: EOM are normal. Pupils are equal, round, and reactive to light.  Neck: Normal range of motion. Neck supple.  Cardiovascular: Normal rate, regular rhythm and normal heart sounds.  Exam reveals no gallop and no friction rub.   No  murmur heard. Pulmonary/Chest: Effort normal. No respiratory distress. She has no wheezes. She has no rales.  Abdominal: Soft. There is tenderness. There is no rebound and no guarding.  TTP in RLQ  Musculoskeletal: Normal range of motion. She exhibits no edema.  Neurological: She is alert and oriented to person, place, and time.  Skin: Skin is warm and dry. No rash noted.  Psychiatric: She has a normal mood and affect. Her behavior is normal.  Nursing note and vitals reviewed.   ED Course  Procedures   DIAGNOSTIC STUDIES: Oxygen Saturation is 100% on RA, normal by my interpretation.    COORDINATION OF CARE: 9:14 PM Discussed treatment plan with  pt at bedside and pt agreed to plan.   Labs Review Labs Reviewed - No data to display  Imaging Review No results found.   EKG Interpretation None      MDM   Final diagnoses:  None    Patient presents with right flank pain, but no emergent cause found in workup.  Will treat with pain meds, return prn.   I personally performed the services described in this documentation, which was scribed in my presence. The recorded information has been reviewed and is accurate.       Veryl Speak, MD 01/27/15 [phone]

## 2015-01-26 NOTE — Discharge Instructions (Signed)
Hydrocodone as prescribed as needed for pain.  Return to the emergency department if you develop worsening pain, high fever, bloody stool, or other new and concerning symptoms.  Follow-up with your primary Dr. if not improving in the next 3 days.   Abdominal Pain Many things can cause abdominal pain. Usually, abdominal pain is not caused by a disease and will improve without treatment. It can often be observed and treated at home. Your health care provider will do a physical exam and possibly order blood tests and X-rays to help determine the seriousness of your pain. However, in many cases, more time must pass before a clear cause of the pain can be found. Before that point, your health care provider may not know if you need more testing or further treatment. HOME CARE INSTRUCTIONS  Monitor your abdominal pain for any changes. The following actions may help to alleviate any discomfort you are experiencing:  Only take over-the-counter or prescription medicines as directed by your health care provider.  Do not take laxatives unless directed to do so by your health care provider.  Try a clear liquid diet (broth, tea, or water) as directed by your health care provider. Slowly move to a bland diet as tolerated. SEEK MEDICAL CARE IF:  You have unexplained abdominal pain.  You have abdominal pain associated with nausea or diarrhea.  You have pain when you urinate or have a bowel movement.  You experience abdominal pain that wakes you in the night.  You have abdominal pain that is worsened or improved by eating food.  You have abdominal pain that is worsened with eating fatty foods.  You have a fever. SEEK IMMEDIATE MEDICAL CARE IF:   Your pain does not go away within 2 hours.  You keep throwing up (vomiting).  Your pain is felt only in portions of the abdomen, such as the right side or the left lower portion of the abdomen.  You pass bloody or black tarry stools. MAKE SURE  YOU:  Understand these instructions.   Will watch your condition.   Will get help right away if you are not doing well or get worse.  Document Released: 09/03/2005 Document Revised: 11/29/2013 Document Reviewed: 08/03/2013 [facility] Patient Information 2015 [place], [place]. This information is not intended to replace advice given to you by your health care provider. Make sure you discuss any questions you have with your health care provider.

## 2015-08-01 DIAGNOSIS — E785 Hyperlipidemia, unspecified: Secondary | ICD-10-CM | POA: Insufficient documentation

## 2015-08-01 DIAGNOSIS — Z853 Personal history of malignant neoplasm of breast: Secondary | ICD-10-CM

## 2015-08-01 DIAGNOSIS — E7800 Pure hypercholesterolemia, unspecified: Secondary | ICD-10-CM | POA: Insufficient documentation

## 2015-08-01 DIAGNOSIS — C50911 Malignant neoplasm of unspecified site of right female breast: Secondary | ICD-10-CM | POA: Insufficient documentation

## 2015-08-01 DIAGNOSIS — K760 Fatty (change of) liver, not elsewhere classified: Secondary | ICD-10-CM | POA: Insufficient documentation

## 2015-08-01 HISTORY — DX: Hyperlipidemia, unspecified: E78.5

## 2015-09-25 DIAGNOSIS — E1165 Type 2 diabetes mellitus with hyperglycemia: Secondary | ICD-10-CM | POA: Insufficient documentation

## 2015-09-25 DIAGNOSIS — IMO0002 Reserved for concepts with insufficient information to code with codable children: Secondary | ICD-10-CM | POA: Insufficient documentation

## 2016-05-20 DIAGNOSIS — J0100 Acute maxillary sinusitis, unspecified: Secondary | ICD-10-CM | POA: Insufficient documentation

## 2016-07-24 DIAGNOSIS — Z6837 Body mass index (BMI) 37.0-37.9, adult: Secondary | ICD-10-CM | POA: Insufficient documentation

## 2016-07-24 DIAGNOSIS — M7041 Prepatellar bursitis, right knee: Secondary | ICD-10-CM | POA: Insufficient documentation

## 2016-07-24 DIAGNOSIS — M1711 Unilateral primary osteoarthritis, right knee: Secondary | ICD-10-CM | POA: Insufficient documentation

## 2017-04-02 DIAGNOSIS — M7061 Trochanteric bursitis, right hip: Secondary | ICD-10-CM | POA: Insufficient documentation

## 2017-06-28 ENCOUNTER — Encounter (HOSPITAL_BASED_OUTPATIENT_CLINIC_OR_DEPARTMENT_OTHER): Payer: Self-pay | Admitting: *Deleted

## 2017-06-28 ENCOUNTER — Emergency Department (HOSPITAL_BASED_OUTPATIENT_CLINIC_OR_DEPARTMENT_OTHER)
Admission: EM | Admit: 2017-06-28 | Discharge: 2017-06-28 | Disposition: A | Discharge status: Home / Self Care | Payer: Medicare Other | Attending: Emergency Medicine | Admitting: Emergency Medicine

## 2017-06-28 DIAGNOSIS — E119 Type 2 diabetes mellitus without complications: Secondary | ICD-10-CM | POA: Insufficient documentation

## 2017-06-28 DIAGNOSIS — I10 Essential (primary) hypertension: Secondary | ICD-10-CM | POA: Diagnosis not present

## 2017-06-28 DIAGNOSIS — L03012 Cellulitis of left finger: Secondary | ICD-10-CM

## 2017-06-28 DIAGNOSIS — Z79899 Other long term (current) drug therapy: Secondary | ICD-10-CM | POA: Insufficient documentation

## 2017-06-28 DIAGNOSIS — Z853 Personal history of malignant neoplasm of breast: Secondary | ICD-10-CM | POA: Diagnosis not present

## 2017-06-28 DIAGNOSIS — M79645 Pain in left finger(s): Secondary | ICD-10-CM | POA: Diagnosis present

## 2017-06-28 MED ORDER — LIDOCAINE HCL (PF) 2 % IJ SOLN
INTRAMUSCULAR | Status: AC
  Filled 2017-06-28: qty 2

## 2017-06-28 MED ORDER — LIDOCAINE HCL 2 % IJ SOLN
10.0000 mL | Freq: Once | INTRAMUSCULAR | Status: AC
  Administered 2017-06-28: 05:00:00

## 2017-06-28 NOTE — ED Notes (Signed)
Wound dressed with xeroform dressing and gauze. Tolerated well.

## 2017-06-28 NOTE — ED Provider Notes (Signed)
[place] [facility] Provider Note: Georgena Spurling, MD, FACEP  CSN: [id number] MRN: [id number] ARRIVAL: 06/28/17 at 0357 ROOM: [facility]   HISTORY OF PRESENT ILLNESS  Drianna Chandran is a 66 y.o. female with a one-day history of pain, tenderness and swelling along the thenar side of the nail of the left third finger. She rates her pain as a 10 out of 10, worse with palpation or movement. She admits to biting her nails.   Past Medical History:  Diagnosis Date  . Breast cancer ([place])   . Diabetes mellitus without complication ([place])   . Hypertension     Past Surgical History:  Procedure Laterality Date  . ABDOMINAL HYSTERECTOMY    . APPENDECTOMY    . BREAST LUMPECTOMY    . ELBOW SURGERY    . HERNIA REPAIR    . KNEE SURGERY      No family history on file.  Social History  Substance Use Topics  . Smoking status: Never Smoker  . Smokeless tobacco: Never Used  . Alcohol use No    Prior to Admission medications   Medication Sig Start Date End Date Taking? Authorizing Provider  GLIPIZIDE ER PO Take by mouth.   Yes [provider]  amLODipine (NORVASC) 2.5 MG tablet Take 2.5 mg by mouth daily.    [provider]  famotidine (PEPCID) 20 MG tablet Take 1 tablet (20 mg total) by mouth 2 (two) times daily. 07/19/14   Palumbo, April, MD  HYDROcodone-acetaminophen (NORCO) 5-325 MG per tablet Take 1-2 tablets by mouth every 6 (six) hours as needed. 01/26/15   Veryl Speak, MD  LOSARTAN POTASSIUM PO Take by mouth.    [provider]  METFORMIN HCL PO Take by mouth.    [provider]  ondansetron (ZOFRAN ODT) 4 MG disintegrating tablet 4mg  ODT q4 hours prn nausea/vomit 04/21/14   Malvin Johns, MD  predniSONE (DELTASONE) 20 MG tablet 3 tabs po day one, then 2 po daily x 4 days 07/19/14   Palumbo, April, MD    Allergies Losartan   REVIEW OF SYSTEMS  Negative except as noted here or in the History of Present  Illness.   PHYSICAL EXAMINATION  Initial Vital Signs Blood pressure (!) 153/73, pulse 71, temperature 98.2 F (36.8 C), temperature source Oral, height 5\' 5"  (1.651 m), weight 113.9 kg (251 lb), SpO2 99 %.  Examination General: Well-developed, well-nourished female in no acute distress; appearance consistent with age of record HENT: normocephalic; atraumatic Eyes: Normal appearance Neck: supple Heart: regular rate and rhythm Lungs: clear to auscultation bilaterally Abdomen: soft; nondistended; nontender; bowel sounds present Extremities: No deformity; full range of motion; tenderness and swelling along thenar side of nail of left middle finger consistent with a paronychia, no signs of a felon Neurologic: Awake, alert and oriented; motor function intact in all extremities and symmetric; no facial droop Skin: Warm and dry Psychiatric: Normal mood and affect   RESULTS  Summary of this visit's results, reviewed by myself:   EKG Interpretation  Date/Time:    Ventricular Rate:    PR Interval:    QRS Duration:   QT Interval:    QTC Calculation:   R Axis:     Text Interpretation:        Laboratory Studies: No results found for this or any previous visit (from the past 24 hour(s)). Imaging Studies: No results found.  ED COURSE  Nursing notes and initial vitals signs, including pulse  oximetry, reviewed.  Vitals:   06/28/17 0404  BP: (!) 153/73  Pulse: 71  Temp: 98.2 F (36.8 C)  TempSrc: Oral  SpO2: 99%  Weight: 113.9 kg (251 lb)  Height: 5\' 5"  (1.651 m)    PROCEDURES   INCISION AND DRAINAGE Performed by: Shanon Rosser L Consent: Verbal consent obtained. Risks and benefits: risks, benefits and alternatives were discussed Type: Paronychia   Body area: Left third finger  Anesthesia: Hemi-digital block  Incision was made with a scalpel.  Local anesthetic: lidocaine 2 % without epinephrine  Anesthetic total: 3 ml  Complexity: complex Blunt dissection to  break up loculations  Drainage: purulent  Drainage amount: Moderate   Packing material: None   Patient tolerance: Patient tolerated the procedure well with no immediate complications.     ED DIAGNOSES     ICD-10-CM   1. Paronychia of left middle finger L03.012        Leimomi Zervas, MD 06/28/17 0500

## 2017-06-28 NOTE — ED Triage Notes (Signed)
Pt  states she bites her nails and c/o swelling to left middle finger since yesterday. C/o left finger throbbing and felt warm to touch. States 3 ibuprofen taken at 9pm last night. Swelling with redness noted to the tip of her left middle finger on exam.

## 2017-10-28 ENCOUNTER — Other Ambulatory Visit: Payer: Self-pay

## 2017-10-28 ENCOUNTER — Emergency Department (HOSPITAL_BASED_OUTPATIENT_CLINIC_OR_DEPARTMENT_OTHER)
Admission: EM | Admit: 2017-10-28 | Discharge: 2017-10-28 | Disposition: A | Discharge status: Home / Self Care | Payer: Medicare Other | Attending: Emergency Medicine | Admitting: Emergency Medicine

## 2017-10-28 ENCOUNTER — Encounter (HOSPITAL_BASED_OUTPATIENT_CLINIC_OR_DEPARTMENT_OTHER): Payer: Self-pay | Admitting: *Deleted

## 2017-10-28 ENCOUNTER — Emergency Department (HOSPITAL_BASED_OUTPATIENT_CLINIC_OR_DEPARTMENT_OTHER): Payer: Medicare Other

## 2017-10-28 DIAGNOSIS — Z7984 Long term (current) use of oral hypoglycemic drugs: Secondary | ICD-10-CM | POA: Diagnosis not present

## 2017-10-28 DIAGNOSIS — E119 Type 2 diabetes mellitus without complications: Secondary | ICD-10-CM | POA: Insufficient documentation

## 2017-10-28 DIAGNOSIS — R059 Cough, unspecified: Secondary | ICD-10-CM

## 2017-10-28 DIAGNOSIS — Z853 Personal history of malignant neoplasm of breast: Secondary | ICD-10-CM | POA: Diagnosis not present

## 2017-10-28 DIAGNOSIS — I10 Essential (primary) hypertension: Secondary | ICD-10-CM | POA: Diagnosis not present

## 2017-10-28 DIAGNOSIS — M546 Pain in thoracic spine: Secondary | ICD-10-CM | POA: Insufficient documentation

## 2017-10-28 DIAGNOSIS — Z79899 Other long term (current) drug therapy: Secondary | ICD-10-CM | POA: Insufficient documentation

## 2017-10-28 DIAGNOSIS — R197 Diarrhea, unspecified: Secondary | ICD-10-CM | POA: Insufficient documentation

## 2017-10-28 DIAGNOSIS — R110 Nausea: Secondary | ICD-10-CM | POA: Insufficient documentation

## 2017-10-28 DIAGNOSIS — R05 Cough: Secondary | ICD-10-CM | POA: Diagnosis not present

## 2017-10-28 DIAGNOSIS — M549 Dorsalgia, unspecified: Secondary | ICD-10-CM

## 2017-10-28 LAB — CBC WITH DIFFERENTIAL/PLATELET
BASOS ABS: 0 10*3/uL (ref 0.0–0.1)
BASOS PCT: 0 %
EOS ABS: 0.2 10*3/uL (ref 0.0–0.7)
Eosinophils Relative: 3 %
HCT: 37.8 % (ref 36.0–46.0)
HEMOGLOBIN: 12.8 g/dL (ref 12.0–15.0)
LYMPHS ABS: 2.8 10*3/uL (ref 0.7–4.0)
Lymphocytes Relative: 43 %
MCH: 31.2 pg (ref 26.0–34.0)
MCHC: 33.9 g/dL (ref 30.0–36.0)
MCV: 92.2 fL (ref 78.0–100.0)
Monocytes Absolute: 0.6 10*3/uL (ref 0.1–1.0)
Monocytes Relative: 9 %
NEUTROS PCT: 45 %
Neutro Abs: 2.9 10*3/uL (ref 1.7–7.7)
PLATELETS: 279 10*3/uL (ref 150–400)
RBC: 4.1 MIL/uL (ref 3.87–5.11)
RDW: 12.4 % (ref 11.5–15.5)
WBC: 6.6 10*3/uL (ref 4.0–10.5)

## 2017-10-28 LAB — TROPONIN I

## 2017-10-28 LAB — BASIC METABOLIC PANEL
Anion gap: 6 (ref 5–15)
BUN: 16 mg/dL (ref 6–20)
CHLORIDE: 104 mmol/L (ref 101–111)
CO2: 25 mmol/L (ref 22–32)
CREATININE: 0.75 mg/dL (ref 0.44–1.00)
Calcium: 8.9 mg/dL (ref 8.9–10.3)
GFR calc non Af Amer: >60 mL/min (ref >60)
Glucose, Bld: 224 mg/dL — ABNORMAL HIGH (ref 65–99)
Potassium: 3.4 mmol/L — ABNORMAL LOW (ref 3.5–5.1)
SODIUM: 135 mmol/L (ref 135–145)

## 2017-10-28 MED ORDER — ONDANSETRON 8 MG PO TBDP: 8.0000 mg | ORAL_TABLET | Freq: Three times a day (TID) | ORAL | 0 refills | Status: DC | PRN

## 2017-10-28 MED ORDER — ONDANSETRON HCL 4 MG/2ML IJ SOLN
4.0000 mg | Freq: Once | INTRAMUSCULAR | Status: AC
  Administered 2017-10-28: 4 mg via INTRAVENOUS
  Filled 2017-10-28: qty 2

## 2017-10-28 NOTE — ED Provider Notes (Signed)
[facility] Provider Note: Georgena Spurling, MD, FACEP  CSN: [id number] MRN: [id number] ARRIVAL: 10/28/17 at [place]: [place]  10/28/17 3:28 AM Katherine Pratt is a 66 y.o. female states she had a cold last week.  The cold symptoms have resolved but she has had a lingering cough for about a week.  She states this cough is only occasional and is not causing her significant distress.  There is no associated shortness of breath or chest pain.  Her principal concern is a 2-day history of pain across her upper back from shoulder blade to shoulder blade.  This pain is dull and worse with activity.  It is not worse with coughing.  She denies a history of a similar pain in the past.  She denies injury.  She recently had diarrhea, the last episode being yesterday morning.  She has also had nausea recently but is not nauseated at the present time.  She is very anxious and concerned that something serious is wrong with her.   Past Medical History:  Diagnosis Date  . Breast cancer ([place])   . Diabetes mellitus without complication ([place])   . Hypertension     Past Surgical History:  Procedure Laterality Date  . ABDOMINAL HYSTERECTOMY    . APPENDECTOMY    . BREAST LUMPECTOMY    . ELBOW SURGERY    . HERNIA REPAIR    . KNEE SURGERY      History reviewed. No pertinent family history.  Social History   Tobacco Use  . Smoking status: Never Smoker  . Smokeless tobacco: Never Used  Substance Use Topics  . Alcohol use: No  . Drug use: No    Prior to Admission medications   Medication Sig Start Date End Date Taking? Authorizing Provider  amLODipine (NORVASC) 2.5 MG tablet Take 2.5 mg by mouth daily.    [provider]  famotidine (PEPCID) 20 MG tablet Take 1 tablet (20 mg total) by mouth 2 (two) times daily. 07/19/14   Palumbo, April, MD  GLIPIZIDE ER PO Take by mouth.    [provider]  METFORMIN HCL PO  Take by mouth.    [provider]    Allergies Losartan   REVIEW OF SYSTEMS  Negative except as noted here or in the History of Present Illness.   PHYSICAL EXAMINATION  Initial Vital Signs Blood pressure (!) 163/78, pulse 72, temperature 98.2 F (36.8 C), resp. rate 16, height 5\' 5"  (1.651 m), weight 113.4 kg (250 lb), SpO2 99 %.  Examination General: Well-developed, well-nourished female in no acute distress; appearance consistent with age of record HENT: normocephalic; atraumatic Eyes: pupils equal, round and reactive to light; extraocular muscles intact Neck: supple Heart: regular rate and rhythm; no murmur Lungs: clear to auscultation bilaterally Abdomen: soft; nondistended; nontender; bowel sounds present Back: Nontender Extremities: No deformity; full range of motion; pulses normal Neurologic: Awake, alert and oriented; motor function intact in all extremities and symmetric; no facial droop Skin: Warm and dry Psychiatric: Anxious mood with a flat affect   RESULTS  Summary of this visit's results, reviewed by myself:   EKG Interpretation  Date/Time:  Wednesday October 28 2017 03:39:09 EST Ventricular Rate:  66 PR Interval:    QRS Duration: 76 QT Interval:  532 QTC Calculation: 558 R Axis:   72 Text Interpretation:  Sinus rhythm Prolonged PR interval Borderline T wave abnormality Confirmed by Jil Penland (  [id number]) on 10/28/2017 3:42:43 AM      Laboratory Studies: Results for orders placed or performed during the hospital encounter of 10/28/17 (from the past 24 hour(s))  Basic metabolic panel     Status: Abnormal   Collection Time: 10/28/17  4:11 AM  Result Value Ref Range   Sodium 135 135 - 145 mmol/L   Potassium 3.4 (L) 3.5 - 5.1 mmol/L   Chloride 104 101 - 111 mmol/L   CO2 25 22 - 32 mmol/L   Glucose, Bld 224 (H) 65 - 99 mg/dL   BUN 16 6 - 20 mg/dL   Creatinine, Ser 0.75 0.44 - 1.00 mg/dL   Calcium 8.9 8.9 - 10.3 mg/dL   GFR calc non Af Amer  >60 >60 mL/min   GFR calc Af Amer >60 >60 mL/min   Anion gap 6 5 - 15  CBC with Differential/Platelet     Status: None   Collection Time: 10/28/17  4:11 AM  Result Value Ref Range   WBC 6.6 4.0 - 10.5 K/uL   RBC 4.10 3.87 - 5.11 MIL/uL   Hemoglobin 12.8 12.0 - 15.0 g/dL   HCT 37.8 36.0 - 46.0 %   MCV 92.2 78.0 - 100.0 fL   MCH 31.2 26.0 - 34.0 pg   MCHC 33.9 30.0 - 36.0 g/dL   RDW 12.4 11.5 - 15.5 %   Platelets 279 150 - 400 K/uL   Neutrophils Relative % 45 %   Neutro Abs 2.9 1.7 - 7.7 K/uL   Lymphocytes Relative 43 %   Lymphs Abs 2.8 0.7 - 4.0 K/uL   Monocytes Relative 9 %   Monocytes Absolute 0.6 0.1 - 1.0 K/uL   Eosinophils Relative 3 %   Eosinophils Absolute 0.2 0.0 - 0.7 K/uL   Basophils Relative 0 %   Basophils Absolute 0.0 0.0 - 0.1 K/uL  Troponin I     Status: None   Collection Time: 10/28/17  4:11 AM  Result Value Ref Range   Troponin I <0.03 <0.03 ng/mL   Imaging Studies: Dg Chest 2 View  Result Date: 10/28/2017 CLINICAL DATA:  Upper back pain for 2 days. Upper respiratory tract symptoms for 1 week. Cough, chills, fever. History of hypertension, diabetes, breast cancer, hysterectomy, nonsmoker EXAM: CHEST  2 VIEW COMPARISON:  09/02/2015 FINDINGS: Normal heart size and pulmonary vascularity. No focal airspace disease or consolidation in the lungs. No blunting of costophrenic angles. No pneumothorax. Mediastinal contours appear intact. Degenerative changes in the spine. Slight fibrosis in the left lung base. Calcification of the aorta. IMPRESSION: No active cardiopulmonary disease.  Aortic atherosclerosis. Electronically Signed   By: Lucienne Capers M.D.   On: 10/28/2017 01:24    ED COURSE  Nursing notes and initial vitals signs, including pulse oximetry, reviewed.  Vitals:   10/28/17 0045 10/28/17 0046  BP:  (!) 163/78  Pulse:  72  Resp:  16  Temp:  98.2 F (36.8 C)  SpO2:  99%  Weight: 113.4 kg (250 lb)   Height: 5\' 5"  (1.651 m)     PROCEDURES    ED  DIAGNOSES     ICD-10-CM   1. Upper back pain M54.9   2. Cough R05        Marciano Mundt, Jenny Reichmann, MD 10/28/17 [phone]

## 2017-10-28 NOTE — ED Notes (Signed)
C/o pain from shoulder blade to shoulder blade, increased w movement,  dry cough, chills/hot, x 1 week, diarrhea x 1 last pm and x 1 this am

## 2017-10-28 NOTE — ED Triage Notes (Signed)
Pt c/o upper back pain x 2 days , URI symptoms x 1 week, dry cough, chill and fever

## 2017-12-28 ENCOUNTER — Encounter (HOSPITAL_BASED_OUTPATIENT_CLINIC_OR_DEPARTMENT_OTHER): Payer: Self-pay | Admitting: Emergency Medicine

## 2017-12-28 ENCOUNTER — Other Ambulatory Visit: Payer: Self-pay

## 2017-12-28 DIAGNOSIS — Z7984 Long term (current) use of oral hypoglycemic drugs: Secondary | ICD-10-CM | POA: Diagnosis not present

## 2017-12-28 DIAGNOSIS — R2689 Other abnormalities of gait and mobility: Secondary | ICD-10-CM | POA: Diagnosis not present

## 2017-12-28 DIAGNOSIS — Z853 Personal history of malignant neoplasm of breast: Secondary | ICD-10-CM | POA: Diagnosis not present

## 2017-12-28 DIAGNOSIS — R42 Dizziness and giddiness: Secondary | ICD-10-CM | POA: Insufficient documentation

## 2017-12-28 DIAGNOSIS — E119 Type 2 diabetes mellitus without complications: Secondary | ICD-10-CM | POA: Diagnosis not present

## 2017-12-28 DIAGNOSIS — R002 Palpitations: Secondary | ICD-10-CM | POA: Diagnosis not present

## 2017-12-28 DIAGNOSIS — H538 Other visual disturbances: Secondary | ICD-10-CM | POA: Diagnosis not present

## 2017-12-28 DIAGNOSIS — Z79899 Other long term (current) drug therapy: Secondary | ICD-10-CM | POA: Diagnosis not present

## 2017-12-28 DIAGNOSIS — I10 Essential (primary) hypertension: Secondary | ICD-10-CM | POA: Diagnosis not present

## 2017-12-28 NOTE — ED Triage Notes (Signed)
Pt c/o feeling dizzy, off balance and like heart was racing x 2 days. Denies any fever.

## 2017-12-29 ENCOUNTER — Emergency Department (HOSPITAL_BASED_OUTPATIENT_CLINIC_OR_DEPARTMENT_OTHER)
Admission: EM | Admit: 2017-12-29 | Discharge: 2017-12-29 | Disposition: A | Discharge status: Home / Self Care | Payer: Medicare HMO | Attending: Emergency Medicine | Admitting: Emergency Medicine

## 2017-12-29 ENCOUNTER — Emergency Department (HOSPITAL_BASED_OUTPATIENT_CLINIC_OR_DEPARTMENT_OTHER): Payer: Medicare HMO

## 2017-12-29 DIAGNOSIS — R42 Dizziness and giddiness: Secondary | ICD-10-CM

## 2017-12-29 LAB — COMPREHENSIVE METABOLIC PANEL
ALT: 18 U/L (ref 14–54)
AST: 22 U/L (ref 15–41)
Albumin: 3.4 g/dL — ABNORMAL LOW (ref 3.5–5.0)
Alkaline Phosphatase: 106 U/L (ref 38–126)
Anion gap: 8 (ref 5–15)
BILIRUBIN TOTAL: 0.5 mg/dL (ref 0.3–1.2)
BUN: 16 mg/dL (ref 6–20)
CO2: 25 mmol/L (ref 22–32)
CREATININE: 0.74 mg/dL (ref 0.44–1.00)
Calcium: 8.9 mg/dL (ref 8.9–10.3)
Chloride: 104 mmol/L (ref 101–111)
GFR calc Af Amer: >60 mL/min (ref >60)
GLUCOSE: 182 mg/dL — AB (ref 65–99)
POTASSIUM: 3.6 mmol/L (ref 3.5–5.1)
SODIUM: 137 mmol/L (ref 135–145)
TOTAL PROTEIN: 6.9 g/dL (ref 6.5–8.1)

## 2017-12-29 LAB — CBC WITH DIFFERENTIAL/PLATELET
Basophils Absolute: 0 10*3/uL (ref 0.0–0.1)
Basophils Relative: 0 %
EOS ABS: 0.1 10*3/uL (ref 0.0–0.7)
EOS PCT: 2 %
HCT: 41.9 % (ref 36.0–46.0)
Hemoglobin: 14.2 g/dL (ref 12.0–15.0)
LYMPHS ABS: 2.7 10*3/uL (ref 0.7–4.0)
Lymphocytes Relative: 38 %
MCH: 31.3 pg (ref 26.0–34.0)
MCHC: 33.9 g/dL (ref 30.0–36.0)
MCV: 92.3 fL (ref 78.0–100.0)
MONOS PCT: 7 %
Monocytes Absolute: 0.5 10*3/uL (ref 0.1–1.0)
Neutro Abs: 3.8 10*3/uL (ref 1.7–7.7)
Neutrophils Relative %: 53 %
PLATELETS: 292 10*3/uL (ref 150–400)
RBC: 4.54 MIL/uL (ref 3.87–5.11)
RDW: 12.6 % (ref 11.5–15.5)
WBC: 7.1 10*3/uL (ref 4.0–10.5)

## 2017-12-29 LAB — CBG MONITORING, ED: Glucose-Capillary: 175 mg/dL — ABNORMAL HIGH (ref 65–99)

## 2017-12-29 MED ORDER — MECLIZINE HCL 25 MG PO TABS: 25.0000 mg | ORAL_TABLET | Freq: Three times a day (TID) | ORAL | 0 refills | Status: DC | PRN

## 2017-12-29 NOTE — ED Notes (Signed)
Patient c/o blurred vision and dizziness for 2 days.

## 2017-12-29 NOTE — ED Provider Notes (Signed)
[facility] Provider Note: Georgena Spurling, MD, FACEP  CSN: [id number] MRN: [id number] ARRIVAL: 12/28/17 at [facility]: [place]  Dizziness   HISTORY OF PRESENT ILLNESS  12/29/17 1:08 AM Katherine Pratt is a 67 y.o. female with a 3-day history of dizziness.  By dizziness she means she feels off balance, or like she might pass out, when walking or standing.  She denies a sensation of the room spinning.  Symptoms have been intermittent.  They are not present when lying.  She has had associated blurred vision but no nausea, vomiting, tinnitus or difficulty hearing.  She has not noticed any focal numbness or weakness.  She came in tonight because she felt her heart was racing earlier.  It is not racing at the present time.  She denies any chest pain or shortness of breath accompanying the heart racing.  She denies any pain.   Past Medical History:  Diagnosis Date  . Breast cancer ([place])   . Diabetes mellitus without complication ([place])   . Hypertension     Past Surgical History:  Procedure Laterality Date  . ABDOMINAL HYSTERECTOMY    . APPENDECTOMY    . BREAST LUMPECTOMY    . ELBOW SURGERY    . HERNIA REPAIR    . KNEE SURGERY      No family history on file.  Social History   Tobacco Use  . Smoking status: Never Smoker  . Smokeless tobacco: Never Used  Substance Use Topics  . Alcohol use: No  . Drug use: No    Prior to Admission medications   Medication Sig Start Date End Date Taking? Authorizing Provider  amLODipine (NORVASC) 2.5 MG tablet Take 2.5 mg by mouth daily.    [provider]  famotidine (PEPCID) 20 MG tablet Take 1 tablet (20 mg total) by mouth 2 (two) times daily. 07/19/14   Palumbo, April, MD  GLIPIZIDE ER PO Take by mouth.    [provider]  METFORMIN HCL PO Take by mouth.    [provider]  ondansetron (ZOFRAN ODT) 8 MG disintegrating tablet Take 1 tablet (8 mg total) by mouth every 8 (eight) hours as  needed for nausea or vomiting. 10/28/17   Hairo Garraway, MD    Allergies Losartan   REVIEW OF SYSTEMS  Negative except as noted here or in the History of Present Illness.   PHYSICAL EXAMINATION  Initial Vital Signs Blood pressure (!) 142/58, pulse 66, temperature 98.4 F (36.9 C), temperature source Oral, resp. rate 18, height 5\' 5"  (1.651 m), weight 113.4 kg (250 lb), SpO2 100 %.  Examination General: Well-developed, well-nourished female in no acute distress; appearance consistent with age of record HENT: normocephalic; atraumatic Eyes: pupils equal, round and reactive to light; extraocular muscles intact; no nystagmus Neck: supple Heart: regular rate and rhythm Lungs: clear to auscultation bilaterally Abdomen: soft; nondistended; nontender; bowel sounds present Extremities: No deformity; full range of motion; pulses normal Neurologic: Awake, alert and oriented; motor function intact in all extremities and symmetric; no facial droop; negative Romberg; normal finger to nose; able to walk in a straight line but steps cautiously Skin: Warm and dry Psychiatric: Normal mood and affect   RESULTS  Summary of this visit's results, reviewed by myself:   EKG Interpretation  Date/Time:  Tuesday December 29 2017 00:00:51 EST Ventricular Rate:  71 PR Interval:  230 QRS Duration: 88 QT Interval:  406 QTC Calculation: 441 R Axis:   67 Text Interpretation:  Sinus rhythm with 1st degree A-V block Otherwise normal ECG No significant change was found Confirmed by Joani Cosma, Jenny Reichmann [id number]) on 12/29/2017 12:13:29 AM      Laboratory Studies: Results for orders placed or performed during the hospital encounter of 12/29/17 (from the past 24 hour(s))  CBC with Differential     Status: None   Collection Time: 12/29/17 12:32 AM  Result Value Ref Range   WBC 7.1 4.0 - 10.5 K/uL   RBC 4.54 3.87 - 5.11 MIL/uL   Hemoglobin 14.2 12.0 - 15.0 g/dL   HCT 41.9 36.0 - 46.0 %   MCV 92.3 78.0 - 100.0 fL    MCH 31.3 26.0 - 34.0 pg   MCHC 33.9 30.0 - 36.0 g/dL   RDW 12.6 11.5 - 15.5 %   Platelets 292 150 - 400 K/uL   Neutrophils Relative % 53 %   Neutro Abs 3.8 1.7 - 7.7 K/uL   Lymphocytes Relative 38 %   Lymphs Abs 2.7 0.7 - 4.0 K/uL   Monocytes Relative 7 %   Monocytes Absolute 0.5 0.1 - 1.0 K/uL   Eosinophils Relative 2 %   Eosinophils Absolute 0.1 0.0 - 0.7 K/uL   Basophils Relative 0 %   Basophils Absolute 0.0 0.0 - 0.1 K/uL  Comprehensive metabolic panel     Status: Abnormal   Collection Time: 12/29/17 12:32 AM  Result Value Ref Range   Sodium 137 135 - 145 mmol/L   Potassium 3.6 3.5 - 5.1 mmol/L   Chloride 104 101 - 111 mmol/L   CO2 25 22 - 32 mmol/L   Glucose, Bld 182 (H) 65 - 99 mg/dL   BUN 16 6 - 20 mg/dL   Creatinine, Ser 0.74 0.44 - 1.00 mg/dL   Calcium 8.9 8.9 - 10.3 mg/dL   Total Protein 6.9 6.5 - 8.1 g/dL   Albumin 3.4 (L) 3.5 - 5.0 g/dL   AST 22 15 - 41 U/L   ALT 18 14 - 54 U/L   Alkaline Phosphatase 106 38 - 126 U/L   Total Bilirubin 0.5 0.3 - 1.2 mg/dL   GFR calc non Af Amer >60 >60 mL/min   GFR calc Af Amer >60 >60 mL/min   Anion gap 8 5 - 15   Imaging Studies: Ct Head Wo Contrast  Result Date: 12/29/2017 CLINICAL DATA:  Dizziness and blurry vision EXAM: CT HEAD WITHOUT CONTRAST TECHNIQUE: Contiguous axial images were obtained from the base of the skull through the vertex without intravenous contrast. COMPARISON:  Head CT 10/09/2014 FINDINGS: Brain: No mass lesion, intraparenchymal hemorrhage or extra-axial collection. No evidence of acute cortical infarct. Brain parenchyma and CSF-containing spaces are normal for age. Vascular: No hyperdense vessel or unexpected calcification. Skull: Normal visualized skull base, calvarium and extracranial soft tissues. Sinuses/Orbits: No sinus fluid levels or advanced mucosal thickening. No mastoid effusion. Normal orbits. IMPRESSION: Normal head CT. Electronically Signed   By: Ulyses Jarred M.D.   On: 12/29/2017 02:16    ED  COURSE  Nursing notes and initial vitals signs, including pulse oximetry, reviewed.  Vitals:   12/28/17 2358 12/29/17 0032 12/29/17 0130  BP: (!) 160/81 (!) 142/58 127/60  Pulse: 83 66 (!) 58  Resp: 18 18 15   Temp: 98.4 F (36.9 C)    TempSrc: Oral    SpO2: 99% 100% 97%  Weight: 113.4 kg (250 lb)    Height: 5\' 5"  (1.651 m)     3:24 AM Patient advised of reassuring diagnostic studies.  She was  advised to follow-up with her primary care physician or to return for worsening or changing symptoms.  PROCEDURES    ED DIAGNOSES     ICD-10-CM   1. Dizziness R42        Angeliz Settlemyre, MD 12/29/17 [phone]

## 2017-12-29 NOTE — ED Notes (Signed)
Pt able to ambulate with minimal assistance. Pt reports feeling "off balance". Pt did not become tachycardic while ambulating.

## 2018-03-24 DIAGNOSIS — D229 Melanocytic nevi, unspecified: Secondary | ICD-10-CM | POA: Diagnosis not present

## 2018-03-24 DIAGNOSIS — E7800 Pure hypercholesterolemia, unspecified: Secondary | ICD-10-CM | POA: Diagnosis not present

## 2018-03-24 DIAGNOSIS — E1165 Type 2 diabetes mellitus with hyperglycemia: Secondary | ICD-10-CM | POA: Diagnosis not present

## 2018-03-24 DIAGNOSIS — E559 Vitamin D deficiency, unspecified: Secondary | ICD-10-CM | POA: Diagnosis not present

## 2018-03-24 DIAGNOSIS — I10 Essential (primary) hypertension: Secondary | ICD-10-CM | POA: Diagnosis not present

## 2018-04-20 DIAGNOSIS — Z23 Encounter for immunization: Secondary | ICD-10-CM | POA: Diagnosis not present

## 2018-04-20 DIAGNOSIS — R1031 Right lower quadrant pain: Secondary | ICD-10-CM | POA: Diagnosis not present

## 2018-04-20 DIAGNOSIS — Z0000 Encounter for general adult medical examination without abnormal findings: Secondary | ICD-10-CM | POA: Diagnosis not present

## 2018-04-28 DIAGNOSIS — R1031 Right lower quadrant pain: Secondary | ICD-10-CM | POA: Diagnosis not present

## 2018-04-28 DIAGNOSIS — M47819 Spondylosis without myelopathy or radiculopathy, site unspecified: Secondary | ICD-10-CM | POA: Insufficient documentation

## 2018-04-28 DIAGNOSIS — K760 Fatty (change of) liver, not elsewhere classified: Secondary | ICD-10-CM | POA: Diagnosis not present

## 2018-05-28 DIAGNOSIS — Z79899 Other long term (current) drug therapy: Secondary | ICD-10-CM | POA: Diagnosis not present

## 2018-05-28 DIAGNOSIS — I10 Essential (primary) hypertension: Secondary | ICD-10-CM | POA: Diagnosis not present

## 2018-05-28 DIAGNOSIS — E7800 Pure hypercholesterolemia, unspecified: Secondary | ICD-10-CM | POA: Diagnosis not present

## 2018-05-28 DIAGNOSIS — Z6841 Body Mass Index (BMI) 40.0 and over, adult: Secondary | ICD-10-CM | POA: Diagnosis not present

## 2018-05-28 DIAGNOSIS — E1165 Type 2 diabetes mellitus with hyperglycemia: Secondary | ICD-10-CM | POA: Diagnosis not present

## 2018-08-17 DIAGNOSIS — E1165 Type 2 diabetes mellitus with hyperglycemia: Secondary | ICD-10-CM | POA: Diagnosis not present

## 2018-08-17 DIAGNOSIS — E7800 Pure hypercholesterolemia, unspecified: Secondary | ICD-10-CM | POA: Diagnosis not present

## 2018-08-17 DIAGNOSIS — Z6841 Body Mass Index (BMI) 40.0 and over, adult: Secondary | ICD-10-CM | POA: Diagnosis not present

## 2018-08-17 DIAGNOSIS — I10 Essential (primary) hypertension: Secondary | ICD-10-CM | POA: Diagnosis not present

## 2018-11-30 DIAGNOSIS — R4020 Unspecified coma: Secondary | ICD-10-CM | POA: Diagnosis not present

## 2018-11-30 DIAGNOSIS — R42 Dizziness and giddiness: Secondary | ICD-10-CM | POA: Diagnosis not present

## 2018-11-30 DIAGNOSIS — M25511 Pain in right shoulder: Secondary | ICD-10-CM | POA: Diagnosis not present

## 2018-11-30 DIAGNOSIS — E1165 Type 2 diabetes mellitus with hyperglycemia: Secondary | ICD-10-CM | POA: Diagnosis not present

## 2018-11-30 DIAGNOSIS — R0789 Other chest pain: Secondary | ICD-10-CM | POA: Diagnosis not present

## 2018-11-30 DIAGNOSIS — R0602 Shortness of breath: Secondary | ICD-10-CM | POA: Diagnosis not present

## 2018-11-30 DIAGNOSIS — R079 Chest pain, unspecified: Secondary | ICD-10-CM | POA: Diagnosis not present

## 2018-11-30 DIAGNOSIS — M25512 Pain in left shoulder: Secondary | ICD-10-CM | POA: Diagnosis not present

## 2018-11-30 DIAGNOSIS — R0989 Other specified symptoms and signs involving the circulatory and respiratory systems: Secondary | ICD-10-CM | POA: Diagnosis not present

## 2018-11-30 DIAGNOSIS — R110 Nausea: Secondary | ICD-10-CM | POA: Diagnosis not present

## 2018-11-30 DIAGNOSIS — R51 Headache: Secondary | ICD-10-CM | POA: Diagnosis not present

## 2018-12-02 DIAGNOSIS — R001 Bradycardia, unspecified: Secondary | ICD-10-CM | POA: Diagnosis not present

## 2018-12-10 DIAGNOSIS — E1165 Type 2 diabetes mellitus with hyperglycemia: Secondary | ICD-10-CM | POA: Diagnosis not present

## 2018-12-10 DIAGNOSIS — R42 Dizziness and giddiness: Secondary | ICD-10-CM | POA: Diagnosis not present

## 2018-12-10 DIAGNOSIS — R110 Nausea: Secondary | ICD-10-CM | POA: Diagnosis not present

## 2018-12-20 DIAGNOSIS — Z794 Long term (current) use of insulin: Secondary | ICD-10-CM | POA: Diagnosis not present

## 2018-12-20 DIAGNOSIS — I10 Essential (primary) hypertension: Secondary | ICD-10-CM | POA: Diagnosis not present

## 2018-12-20 DIAGNOSIS — E1165 Type 2 diabetes mellitus with hyperglycemia: Secondary | ICD-10-CM | POA: Diagnosis not present

## 2018-12-20 DIAGNOSIS — E7800 Pure hypercholesterolemia, unspecified: Secondary | ICD-10-CM | POA: Diagnosis not present

## 2019-01-20 DIAGNOSIS — R350 Frequency of micturition: Secondary | ICD-10-CM | POA: Diagnosis not present

## 2019-04-11 ENCOUNTER — Encounter (HOSPITAL_BASED_OUTPATIENT_CLINIC_OR_DEPARTMENT_OTHER): Payer: Self-pay

## 2019-04-11 ENCOUNTER — Emergency Department (HOSPITAL_BASED_OUTPATIENT_CLINIC_OR_DEPARTMENT_OTHER)
Admission: EM | Admit: 2019-04-11 | Discharge: 2019-04-11 | Disposition: A | Discharge status: Home / Self Care | Payer: Medicare HMO | Attending: Emergency Medicine | Admitting: Emergency Medicine

## 2019-04-11 ENCOUNTER — Other Ambulatory Visit: Payer: Self-pay

## 2019-04-11 DIAGNOSIS — B029 Zoster without complications: Secondary | ICD-10-CM | POA: Insufficient documentation

## 2019-04-11 DIAGNOSIS — Z79899 Other long term (current) drug therapy: Secondary | ICD-10-CM | POA: Diagnosis not present

## 2019-04-11 DIAGNOSIS — Z7984 Long term (current) use of oral hypoglycemic drugs: Secondary | ICD-10-CM | POA: Diagnosis not present

## 2019-04-11 DIAGNOSIS — R21 Rash and other nonspecific skin eruption: Secondary | ICD-10-CM | POA: Diagnosis not present

## 2019-04-11 DIAGNOSIS — E119 Type 2 diabetes mellitus without complications: Secondary | ICD-10-CM | POA: Insufficient documentation

## 2019-04-11 DIAGNOSIS — Z853 Personal history of malignant neoplasm of breast: Secondary | ICD-10-CM | POA: Diagnosis not present

## 2019-04-11 DIAGNOSIS — I10 Essential (primary) hypertension: Secondary | ICD-10-CM | POA: Insufficient documentation

## 2019-04-11 MED ORDER — OXYCODONE HCL 5 MG PO TABS: 5.0000 mg | ORAL_TABLET | ORAL | 0 refills | Status: DC | PRN

## 2019-04-11 MED ORDER — VALACYCLOVIR HCL 1 G PO TABS: 1000.0000 mg | ORAL_TABLET | Freq: Three times a day (TID) | ORAL | 0 refills | Status: AC

## 2019-04-11 NOTE — Discharge Instructions (Signed)
Take Tylenol 1000 mg 4 times a day for 1 week. This is the maximum dose of Tylenol (acetaminophen) you can take from all sources. Please check other over-the-counter medications and prescriptions to ensure you are not taking other medications that contain acetaminophen.  You may also take ibuprofen 400 mg 6 times a day alternating with or at the same time as tylenol.  Take oxycodone as needed for breakthrough pain.  This medication can be addicting, sedating and cause constipation.

## 2019-04-11 NOTE — ED Triage Notes (Signed)
Pt has some bumps on her scalp with surrounding erythemia. First bump appeared a week ago and more have appeared since. State they are painful, denies purulent drainage.

## 2019-04-12 NOTE — ED Provider Notes (Signed)
[facility] EMERGENCY DEPARTMENT Provider Note   CSN: [id number] Arrival date & time: 04/11/19  1608    History   Chief Complaint Chief Complaint  Patient presents with  . Rash    HPI Katherine Pratt is a 68 y.o. female.     HPI   Presents with concern for blistering rash on scalp Began approx 10 days ago, however newest lesion appeared yesterday.  Reports burning and throbbing pain to the scalp which is severe and keeping her from sleep. No fevers. No other acute concerns. Reports areas start off as blisters and then turn into other bumps. No drainage. No eye pain or visual changes.   Past Medical History:  Diagnosis Date  . Breast cancer ([place])   . Diabetes mellitus without complication ([place])   . Hypertension     There are no active problems to display for this patient.   Past Surgical History:  Procedure Laterality Date  . ABDOMINAL HYSTERECTOMY    . APPENDECTOMY    . BREAST LUMPECTOMY    . ELBOW SURGERY    . HERNIA REPAIR    . KNEE SURGERY       OB History   No obstetric history on file.      Home Medications    Prior to Admission medications   Medication Sig Start Date End Date Taking? Authorizing Provider  amLODipine (NORVASC) 2.5 MG tablet Take 2.5 mg by mouth daily.    [provider]  famotidine (PEPCID) 20 MG tablet Take 1 tablet (20 mg total) by mouth 2 (two) times daily. 07/19/14   Palumbo, April, MD  GLIPIZIDE ER PO Take by mouth.    [provider]  meclizine (ANTIVERT) 25 MG tablet Take 1 tablet (25 mg total) by mouth 3 (three) times daily as needed for dizziness. 12/29/17   Molpus, John, MD  METFORMIN HCL PO Take by mouth.    [provider]  ondansetron (ZOFRAN ODT) 8 MG disintegrating tablet Take 1 tablet (8 mg total) by mouth every 8 (eight) hours as needed for nausea or vomiting. 10/28/17   Molpus, John, MD  oxyCODONE (ROXICODONE) 5 MG immediate release tablet Take 1 tablet (5 mg total) by mouth every 4  (four) hours as needed for severe pain. 04/11/19   Gareth Morgan, MD  valACYclovir (VALTREX) 1000 MG tablet Take 1 tablet (1,000 mg total) by mouth 3 (three) times daily for 7 days. 04/11/19 04/18/19  Gareth Morgan, MD    Family History No family history on file.  Social History Social History   Tobacco Use  . Smoking status: Never Smoker  . Smokeless tobacco: Never Used  Substance Use Topics  . Alcohol use: No  . Drug use: No     Allergies   Losartan   Review of Systems Review of Systems  Constitutional: Negative for fever.  Eyes: Negative for pain and visual disturbance.  Respiratory: Negative for cough and shortness of breath.   Cardiovascular: Negative for chest pain.  Gastrointestinal: Negative for nausea and vomiting.  Skin: Negative for rash.     Physical Exam Updated Vital Signs BP 140/84 (BP Location: Right Arm)   Pulse 82   Temp 99.4 F (37.4 C) (Oral)   Resp 20   Ht 5\' 5"  (1.651 m)   Wt 111.1 kg   SpO2 100%   BMI 40.77 kg/m   Physical Exam Vitals signs and nursing note reviewed.  Constitutional:      General: She is not in acute distress.  Appearance: Normal appearance. She is well-developed. She is not ill-appearing, toxic-appearing or diaphoretic.  HENT:     Head: Normocephalic and atraumatic.     Comments: Right side of scalp with vesicles different stages of healing, no sign of abscess, no surrounding erythema Eyes:     Conjunctiva/sclera: Conjunctivae normal.     Pupils: Pupils are equal, round, and reactive to light.  Neck:     Musculoskeletal: Normal range of motion.  Cardiovascular:     Rate and Rhythm: Normal rate and regular rhythm.  Pulmonary:     Effort: Pulmonary effort is normal. No respiratory distress.  Musculoskeletal:        General: No tenderness.  Skin:    General: Skin is warm and dry.     Findings: Rash (vesicles different stages of healing right scalp, behind hear, does not involve auditory canal) present. No  erythema.  Neurological:     Mental Status: She is alert and oriented to person, place, and time.      ED Treatments / Results  Labs (all labs ordered are listed, but only abnormal results are displayed) Labs Reviewed - No data to display  EKG None  Radiology No results found.  Procedures Procedures (including critical care time)  Medications Ordered in ED Medications - No data to display   Initial Impression / Assessment and Plan / ED Course  I have reviewed the triage vital signs and the nursing notes.  Pertinent labs & imaging results that were available during my care of the patient were reviewed by me and considered in my medical decision making (see chart for details).        68yo female presents with concern for painful rash with vesicles to right side of scalp.  Rash consistent with shingles. No signs of other complications/[place] or ophthalmologic involvement. No sign of surrounding erythema  Present for greater than 72 hours, however new lesions developing within last few days and will treat with valacyclovir.   Reviewed in [facility] drug database, discussed risks of oxycodone and gave rx for pain. Patient discharged in stable condition with understanding of reasons to return.   Final Clinical Impressions(s) / ED Diagnoses   Final diagnoses:  Herpes zoster without complication    ED Discharge Orders         Ordered    valACYclovir (VALTREX) 1000 MG tablet  3 times daily     04/11/19 1836    oxyCODONE (ROXICODONE) 5 MG immediate release tablet  Every 4 hours PRN     04/11/19 1836           Gareth Morgan, MD 04/12/19 1030

## 2019-04-21 DIAGNOSIS — B029 Zoster without complications: Secondary | ICD-10-CM | POA: Diagnosis not present

## 2019-04-21 DIAGNOSIS — Z7984 Long term (current) use of oral hypoglycemic drugs: Secondary | ICD-10-CM | POA: Diagnosis not present

## 2019-04-21 DIAGNOSIS — B0229 Other postherpetic nervous system involvement: Secondary | ICD-10-CM | POA: Diagnosis not present

## 2019-04-21 DIAGNOSIS — G4489 Other headache syndrome: Secondary | ICD-10-CM | POA: Diagnosis not present

## 2019-04-21 DIAGNOSIS — E1165 Type 2 diabetes mellitus with hyperglycemia: Secondary | ICD-10-CM | POA: Diagnosis not present

## 2019-06-01 ENCOUNTER — Emergency Department (HOSPITAL_BASED_OUTPATIENT_CLINIC_OR_DEPARTMENT_OTHER)
Admission: EM | Admit: 2019-06-01 | Discharge: 2019-06-01 | Disposition: A | Discharge status: Home / Self Care | Payer: Medicare HMO | Attending: Emergency Medicine | Admitting: Emergency Medicine

## 2019-06-01 ENCOUNTER — Emergency Department (HOSPITAL_BASED_OUTPATIENT_CLINIC_OR_DEPARTMENT_OTHER): Payer: Medicare HMO

## 2019-06-01 ENCOUNTER — Other Ambulatory Visit: Payer: Self-pay

## 2019-06-01 ENCOUNTER — Encounter (HOSPITAL_BASED_OUTPATIENT_CLINIC_OR_DEPARTMENT_OTHER): Payer: Self-pay | Admitting: Emergency Medicine

## 2019-06-01 DIAGNOSIS — Z853 Personal history of malignant neoplasm of breast: Secondary | ICD-10-CM | POA: Insufficient documentation

## 2019-06-01 DIAGNOSIS — E119 Type 2 diabetes mellitus without complications: Secondary | ICD-10-CM | POA: Insufficient documentation

## 2019-06-01 DIAGNOSIS — I10 Essential (primary) hypertension: Secondary | ICD-10-CM | POA: Insufficient documentation

## 2019-06-01 DIAGNOSIS — M546 Pain in thoracic spine: Secondary | ICD-10-CM | POA: Insufficient documentation

## 2019-06-01 DIAGNOSIS — R079 Chest pain, unspecified: Secondary | ICD-10-CM | POA: Diagnosis not present

## 2019-06-01 LAB — COMPREHENSIVE METABOLIC PANEL
ALT: 19 U/L (ref 0–44)
AST: 21 U/L (ref 15–41)
Albumin: 3.7 g/dL (ref 3.5–5.0)
Alkaline Phosphatase: 111 U/L (ref 38–126)
Anion gap: 10 (ref 5–15)
BUN: 17 mg/dL (ref 8–23)
CO2: 22 mmol/L (ref 22–32)
Calcium: 8.7 mg/dL — ABNORMAL LOW (ref 8.9–10.3)
Chloride: 107 mmol/L (ref 98–111)
Creatinine, Ser: 0.89 mg/dL (ref 0.44–1.00)
GFR calc Af Amer: >60 mL/min (ref >60)
GFR calc non Af Amer: >60 mL/min (ref >60)
Glucose, Bld: 138 mg/dL — ABNORMAL HIGH (ref 70–99)
Potassium: 3.6 mmol/L (ref 3.5–5.1)
Sodium: 139 mmol/L (ref 135–145)
Total Bilirubin: 0.8 mg/dL (ref 0.3–1.2)
Total Protein: 7.1 g/dL (ref 6.5–8.1)

## 2019-06-01 LAB — CBC WITH DIFFERENTIAL/PLATELET
Abs Immature Granulocytes: 0.02 10*3/uL (ref 0.00–0.07)
Basophils Absolute: 0 10*3/uL (ref 0.0–0.1)
Basophils Relative: 0 %
Eosinophils Absolute: 0.1 10*3/uL (ref 0.0–0.5)
Eosinophils Relative: 2 %
HCT: 41 % (ref 36.0–46.0)
Hemoglobin: 13.6 g/dL (ref 12.0–15.0)
Immature Granulocytes: 0 %
Lymphocytes Relative: 41 %
Lymphs Abs: 2.7 10*3/uL (ref 0.7–4.0)
MCH: 31.4 pg (ref 26.0–34.0)
MCHC: 33.2 g/dL (ref 30.0–36.0)
MCV: 94.7 fL (ref 80.0–100.0)
Monocytes Absolute: 0.5 10*3/uL (ref 0.1–1.0)
Monocytes Relative: 8 %
Neutro Abs: 3.2 10*3/uL (ref 1.7–7.7)
Neutrophils Relative %: 49 %
Platelets: 302 10*3/uL (ref 150–400)
RBC: 4.33 MIL/uL (ref 3.87–5.11)
RDW: 12.8 % (ref 11.5–15.5)
WBC: 6.6 10*3/uL (ref 4.0–10.5)
nRBC: 0 % (ref 0.0–0.2)

## 2019-06-01 LAB — D-DIMER, QUANTITATIVE: D-Dimer, Quant: 0.55 ug/mL-FEU — ABNORMAL HIGH (ref 0.00–0.50)

## 2019-06-01 LAB — TROPONIN I (HIGH SENSITIVITY): Troponin I (High Sensitivity): 6 ng/L (ref <18)

## 2019-06-01 NOTE — ED Provider Notes (Signed)
[facility] EMERGENCY DEPARTMENT Provider Note   CSN: [id number] Arrival date & time: 06/01/19  1705    History   Chief Complaint Chief Complaint  Patient presents with  . Back Pain    HPI Katherine Pratt is a 68 y.o. female.     The history is provided by the patient.  Back Pain Location:  Thoracic spine Quality:  Aching Radiates to:  Does not radiate Pain severity:  Mild Onset quality:  Gradual Duration:  3 days Timing:  Intermittent Progression:  Waxing and waning Chronicity:  New Context: not lifting heavy objects, not physical stress and not recent illness   Relieved by:  Bed rest Worsened by:  Movement Associated symptoms: chest pain (mostly back)   Associated symptoms: no abdominal pain, no dysuria, no fever, no paresthesias, no pelvic pain and no perianal numbness   Risk factors: hx of cancer     Past Medical History:  Diagnosis Date  . Breast cancer ([place])   . Diabetes mellitus without complication ([place])   . Hypertension     There are no active problems to display for this patient.   Past Surgical History:  Procedure Laterality Date  . ABDOMINAL HYSTERECTOMY    . APPENDECTOMY    . BREAST LUMPECTOMY    . ELBOW SURGERY    . HERNIA REPAIR    . KNEE SURGERY       OB History   No obstetric history on file.      Home Medications    Prior to Admission medications   Medication Sig Start Date End Date Taking? Authorizing Provider  amLODipine (NORVASC) 2.5 MG tablet Take 2.5 mg by mouth daily.    [provider]  famotidine (PEPCID) 20 MG tablet Take 1 tablet (20 mg total) by mouth 2 (two) times daily. 07/19/14   Palumbo, April, MD  GLIPIZIDE ER PO Take by mouth.    [provider]  meclizine (ANTIVERT) 25 MG tablet Take 1 tablet (25 mg total) by mouth 3 (three) times daily as needed for dizziness. 12/29/17   Molpus, John, MD  METFORMIN HCL PO Take by mouth.    [provider]  ondansetron (ZOFRAN ODT) 8 MG  disintegrating tablet Take 1 tablet (8 mg total) by mouth every 8 (eight) hours as needed for nausea or vomiting. 10/28/17   Molpus, John, MD  oxyCODONE (ROXICODONE) 5 MG immediate release tablet Take 1 tablet (5 mg total) by mouth every 4 (four) hours as needed for severe pain. 04/11/19   Gareth Morgan, MD    Family History No family history on file.  Social History Social History   Tobacco Use  . Smoking status: Never Smoker  . Smokeless tobacco: Never Used  Substance Use Topics  . Alcohol use: No  . Drug use: No     Allergies   Losartan   Review of Systems Review of Systems  Constitutional: Negative for chills and fever.  HENT: Negative for ear pain and sore throat.   Eyes: Negative for pain and visual disturbance.  Respiratory: Negative for cough and shortness of breath.   Cardiovascular: Positive for chest pain (mostly back). Negative for palpitations.  Gastrointestinal: Negative for abdominal pain and vomiting.  Genitourinary: Negative for dysuria, hematuria and pelvic pain.  Musculoskeletal: Positive for back pain. Negative for arthralgias.  Skin: Negative for color change and rash.  Neurological: Negative for seizures, syncope and paresthesias.  All other systems reviewed and are negative.    Physical Exam Updated Vital  Signs  ED Triage Vitals  Enc Vitals Group     BP 06/01/19 1716 (!) 168/75     Pulse Rate 06/01/19 1716 67     Resp 06/01/19 1716 16     Temp 06/01/19 1716 98.8 F (37.1 C)     Temp Source 06/01/19 1716 Oral     SpO2 06/01/19 1716 100 %     Weight 06/01/19 1718 245 lb (111.1 kg)     Height 06/01/19 1718 5\' 5"  (1.651 m)     Head Circumference --      Peak Flow --      Pain Score 06/01/19 1718 7     Pain Loc --      Pain Edu? --      Excl. in [place]? --     Physical Exam Vitals signs and nursing note reviewed.  Constitutional:      General: She is not in acute distress.    Appearance: She is well-developed.  HENT:     Head:  Normocephalic and atraumatic.     Nose: Nose normal.     Mouth/Throat:     Mouth: Mucous membranes are moist.  Eyes:     Extraocular Movements: Extraocular movements intact.     Conjunctiva/sclera: Conjunctivae normal.  Neck:     Musculoskeletal: Normal range of motion and neck supple.  Cardiovascular:     Rate and Rhythm: Normal rate and regular rhythm.     Pulses: Normal pulses.     Heart sounds: Normal heart sounds. No murmur.  Pulmonary:     Effort: Pulmonary effort is normal. No respiratory distress.     Breath sounds: Normal breath sounds.  Abdominal:     General: There is no distension.     Palpations: Abdomen is soft.     Tenderness: There is no abdominal tenderness.  Musculoskeletal: Normal range of motion.        General: No tenderness.  Skin:    General: Skin is warm and dry.  Neurological:     General: No focal deficit present.     Mental Status: She is alert and oriented to person, place, and time.     Cranial Nerves: No cranial nerve deficit.     Sensory: No sensory deficit.     Motor: No weakness.     Coordination: Coordination normal.  Psychiatric:        Mood and Affect: Mood normal.      ED Treatments / Results  Labs (all labs ordered are listed, but only abnormal results are displayed) Labs Reviewed  COMPREHENSIVE METABOLIC PANEL - Abnormal; Notable for the following components:      Result Value   Glucose, Bld 138 (*)    Calcium 8.7 (*)    All other components within normal limits  D-DIMER, QUANTITATIVE (NOT AT [facility]) - Abnormal; Notable for the following components:   D-Dimer, Quant 0.55 (*)    All other components within normal limits  TROPONIN I (HIGH SENSITIVITY)  CBC WITH DIFFERENTIAL/PLATELET    EKG EKG Interpretation  Date/Time:  Wednesday June 01 2019 17:20:19 EDT Ventricular Rate:  67 PR Interval:    QRS Duration: 90 QT Interval:  407 QTC Calculation: 430 R Axis:   59 Text Interpretation:  Sinus rhythm Prolonged PR interval  Confirmed by Lennice Sites [phone]) on 06/01/2019 5:27:06 PM   Radiology Dg Chest 2 View  Result Date: 06/01/2019 CLINICAL DATA:  Chest pain EXAM: CHEST - 2 VIEW COMPARISON:  November 30, 2018 FINDINGS:  There is minimal left base atelectasis. The lungs elsewhere are clear. Heart size and pulmonary vascularity are normal. No adenopathy. No pneumothorax. There is mild degenerative change in the thoracic spine. IMPRESSION: Minimal left base atelectasis. No edema or consolidation. Heart size within normal limits. Electronically Signed   By: Lowella Grip III M.D.   On: 06/01/2019 19:22    Procedures Procedures (including critical care time)  Medications Ordered in ED Medications - No data to display   Initial Impression / Assessment and Plan / ED Course  I have reviewed the triage vital signs and the nursing notes.  Pertinent labs & imaging results that were available during my care of the patient were reviewed by me and considered in my medical decision making (see chart for details).     Katherine Pratt is a 68 year old female history of hypertension, diabetes who presents the ED with back pain.  Patient with normal vitals.  No fever.  Patient with overall nonspecific right upper back pain for the last several days that is worse with activity.  Not easily reproducible on exam.  No significant cardiac history.  Patient concerned about possible heart attack.  Currently does not have any pain.  No shortness of breath.  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  EKG shows sinus rhythm.  No ischemic changes.  Overall atypical story but will evaluate with troponin, basic labs.  We will get a d-dimer as patient does have a history of breast cancer.  Will reevaluate.  Troponin normal.  Doubt ACS.  Atypical story.  Pain ongoing for several days.  Patient with age-adjusted d-dimer that is normal.  Doubt PE.  Chest x-ray with no signs of pneumonia,  pneumothorax, pleural effusion.  Patient with no significant electrolyte abnormality, anemia, leukocytosis.  Patient possibly musculoskeletal type symptoms.  Recommend follow-up primary care doctor and discharged from ED in good condition.  Given return precautions.  This chart was dictated using voice recognition software.  Despite best efforts to proofread,  errors can occur which can change the documentation meaning.    Final Clinical Impressions(s) / ED Diagnoses   Final diagnoses:  Acute right-sided thoracic back pain    ED Discharge Orders    None       Lennice Sites, DO 06/01/19 1931

## 2019-06-01 NOTE — Discharge Instructions (Addendum)
Recommend, Tylenol, Motrin for back pain.  Work-up today was unremarkable.  Follow-up with your primary care doctor.  Your chest x-ray was normal.

## 2019-06-01 NOTE — ED Notes (Signed)
Pt on monitor 

## 2019-06-01 NOTE — ED Triage Notes (Signed)
Diaphoresis and exertional fatigue x3 days. Pain behind right shoulder since yesterday.

## 2019-06-01 NOTE — ED Notes (Signed)
ED Provider at bedside. 

## 2019-06-06 DIAGNOSIS — M549 Dorsalgia, unspecified: Secondary | ICD-10-CM | POA: Diagnosis not present

## 2019-06-06 DIAGNOSIS — R110 Nausea: Secondary | ICD-10-CM | POA: Diagnosis not present

## 2019-06-06 DIAGNOSIS — R5383 Other fatigue: Secondary | ICD-10-CM | POA: Diagnosis not present

## 2019-06-06 DIAGNOSIS — E559 Vitamin D deficiency, unspecified: Secondary | ICD-10-CM | POA: Diagnosis not present

## 2019-06-06 DIAGNOSIS — R12 Heartburn: Secondary | ICD-10-CM | POA: Diagnosis not present

## 2019-06-06 DIAGNOSIS — E1165 Type 2 diabetes mellitus with hyperglycemia: Secondary | ICD-10-CM | POA: Diagnosis not present

## 2019-06-06 DIAGNOSIS — R61 Generalized hyperhidrosis: Secondary | ICD-10-CM | POA: Diagnosis not present

## 2019-06-15 DIAGNOSIS — E1165 Type 2 diabetes mellitus with hyperglycemia: Secondary | ICD-10-CM | POA: Diagnosis not present

## 2019-06-15 DIAGNOSIS — E7800 Pure hypercholesterolemia, unspecified: Secondary | ICD-10-CM | POA: Diagnosis not present

## 2019-06-15 DIAGNOSIS — I10 Essential (primary) hypertension: Secondary | ICD-10-CM | POA: Diagnosis not present

## 2019-06-24 DIAGNOSIS — Z20828 Contact with and (suspected) exposure to other viral communicable diseases: Secondary | ICD-10-CM | POA: Diagnosis not present

## 2019-06-28 ENCOUNTER — Other Ambulatory Visit: Payer: Self-pay

## 2019-06-28 ENCOUNTER — Emergency Department (HOSPITAL_BASED_OUTPATIENT_CLINIC_OR_DEPARTMENT_OTHER)
Admission: EM | Admit: 2019-06-28 | Discharge: 2019-06-28 | Disposition: A | Discharge status: Home / Self Care | Payer: Medicare HMO | Attending: Emergency Medicine | Admitting: Emergency Medicine

## 2019-06-28 ENCOUNTER — Encounter (HOSPITAL_BASED_OUTPATIENT_CLINIC_OR_DEPARTMENT_OTHER): Payer: Self-pay

## 2019-06-28 DIAGNOSIS — M79672 Pain in left foot: Secondary | ICD-10-CM | POA: Diagnosis present

## 2019-06-28 DIAGNOSIS — Z7984 Long term (current) use of oral hypoglycemic drugs: Secondary | ICD-10-CM | POA: Insufficient documentation

## 2019-06-28 DIAGNOSIS — Z853 Personal history of malignant neoplasm of breast: Secondary | ICD-10-CM | POA: Insufficient documentation

## 2019-06-28 DIAGNOSIS — L03116 Cellulitis of left lower limb: Secondary | ICD-10-CM

## 2019-06-28 DIAGNOSIS — Z79899 Other long term (current) drug therapy: Secondary | ICD-10-CM | POA: Insufficient documentation

## 2019-06-28 DIAGNOSIS — E119 Type 2 diabetes mellitus without complications: Secondary | ICD-10-CM | POA: Diagnosis not present

## 2019-06-28 DIAGNOSIS — B353 Tinea pedis: Secondary | ICD-10-CM | POA: Diagnosis not present

## 2019-06-28 DIAGNOSIS — I10 Essential (primary) hypertension: Secondary | ICD-10-CM | POA: Diagnosis not present

## 2019-06-28 MED ORDER — CEPHALEXIN 500 MG PO CAPS: 500.0000 mg | ORAL_CAPSULE | Freq: Four times a day (QID) | ORAL | 0 refills | Status: AC

## 2019-06-28 MED ORDER — CLOTRIMAZOLE 1 % EX CREA: TOPICAL_CREAM | CUTANEOUS | 0 refills | Status: DC

## 2019-06-28 MED FILL — CEPHALEXIN 500 MG CAPSULE: 500 | 7 days supply | Qty: 28 | Fill #0

## 2019-06-28 MED FILL — CLOTRIMAZOLE 1% CREAM: 1 | 7 days supply | Qty: 28 | Fill #0

## 2019-06-28 NOTE — ED Provider Notes (Signed)
[facility] EMERGENCY DEPARTMENT Provider Note   CSN: [id number] Arrival date & time: 06/28/19  1343     History   Chief Complaint Chief Complaint  Patient presents with  . Insect Bite    HPI Katherine Pratt is a 68 y.o. female presenting for evaluation of left foot pain and swelling.  Patient states for the past 2 days, she has had itching between her fourth and fifth toes of her left foot.  She also reports swelling of her foot and toes.  She states she is used heat, ice, and a jock itch spray without change in symptoms.  She denies, trauma, or injury.  Symptoms began after she was emptying water that had been sitting in her yard for extended period of time.  She states she always has some swelling of the left leg when compared to the right, is not worse than normal.  She denies fevers or chills.  She has a history of diabetes, states her blood sugars have been slightly higher than normal around 200-2 50.  She denies nausea, vomiting, abdominal pain.  She denies symptoms on the right side.  She denies pain in her lower leg or calf.     HPI  Past Medical History:  Diagnosis Date  . Breast cancer ([place])   . Diabetes mellitus without complication ([place])   . Hypertension     There are no active problems to display for this patient.   Past Surgical History:  Procedure Laterality Date  . ABDOMINAL HYSTERECTOMY    . APPENDECTOMY    . BREAST LUMPECTOMY    . ELBOW SURGERY    . HERNIA REPAIR    . KNEE SURGERY       OB History   No obstetric history on file.      Home Medications    Prior to Admission medications   Medication Sig Start Date End Date Taking? Authorizing Provider  amLODipine (NORVASC) 2.5 MG tablet Take 2.5 mg by mouth daily.    [provider]  cephALEXin (KEFLEX) 500 MG capsule Take 1 capsule (500 mg total) by mouth 4 (four) times daily for 7 days. 06/28/19 07/05/19  Carissa Musick, PA-C  clotrimazole (LOTRIMIN) 1 % cream Apply to  affected area 2 times daily x1 wk 06/28/19   Shavelle Runkel, PA-C  famotidine (PEPCID) 20 MG tablet Take 1 tablet (20 mg total) by mouth 2 (two) times daily. 07/19/14   Palumbo, April, MD  GLIPIZIDE ER PO Take by mouth.    [provider]  meclizine (ANTIVERT) 25 MG tablet Take 1 tablet (25 mg total) by mouth 3 (three) times daily as needed for dizziness. 12/29/17   Molpus, John, MD  METFORMIN HCL PO Take by mouth.    [provider]  ondansetron (ZOFRAN ODT) 8 MG disintegrating tablet Take 1 tablet (8 mg total) by mouth every 8 (eight) hours as needed for nausea or vomiting. 10/28/17   Molpus, John, MD  oxyCODONE (ROXICODONE) 5 MG immediate release tablet Take 1 tablet (5 mg total) by mouth every 4 (four) hours as needed for severe pain. 04/11/19   Gareth Morgan, MD    Family History No family history on file.  Social History Social History   Tobacco Use  . Smoking status: Never Smoker  . Smokeless tobacco: Never Used  Substance Use Topics  . Alcohol use: No  . Drug use: No     Allergies   Losartan   Review of Systems Review of Systems  Cardiovascular: Positive for leg swelling.  Skin: Positive for color change.     Physical Exam Updated Vital Signs BP (!) 150/64 (BP Location: Left Arm)   Pulse 64   Temp 98.2 F (36.8 C) (Oral)   Resp 16   Ht 5\' 5"  (1.651 m)   Wt 111.1 kg   SpO2 100%   BMI 40.77 kg/m   Physical Exam Vitals signs and nursing note reviewed.  Constitutional:      General: She is not in acute distress.    Appearance: She is well-developed.  HENT:     Head: Normocephalic and atraumatic.  Neck:     Musculoskeletal: Normal range of motion.  Pulmonary:     Effort: Pulmonary effort is normal.  Abdominal:     General: There is no distension.  Musculoskeletal:        General: Swelling present.     Comments: Swelling of the left leg when compared to the right, patient states this is baseline.  Pedal pulses intact.  Skin:     General: Skin is warm.     Capillary Refill: Capillary refill takes less than 2 seconds.     Findings: Erythema present. No rash.     Comments: Mild erythema and warmth to the dorsal aspect of the right foot in the distal lower leg.  No induration. White patchy and moist skin between the fourth and the fifth toe of the left foot with surrounding erythema.  Consistent with tinea infection.  Neurological:     Mental Status: She is alert and oriented to person, place, and time.      ED Treatments / Results  Labs (all labs ordered are listed, but only abnormal results are displayed) Labs Reviewed - No data to display  EKG None  Radiology No results found.  Procedures Procedures (including critical care time)  Medications Ordered in ED Medications - No data to display   Initial Impression / Assessment and Plan / ED Course  I have reviewed the triage vital signs and the nursing notes.  Pertinent labs & imaging results that were available during my care of the patient were reviewed by me and considered in my medical decision making (see chart for details).        Pt presenting for evaluation of foot itching and swelling.  Physical exam reassuring, she appears nontoxic.  Exam of the toe consistent with tinea infection.  However, patient also with erythema and warmth of the foot and lower leg.  Concern for superimposed infection for cellulitis.  As such, will treat with Lotrimin cream and Keflex, especially in the setting of diabetes.  Encourage close monitoring of symptoms and follow-up with PCP as needed.  Return precautions given, including signs of worsening infection.  At this time, patient appears safe for discharge.  Patient states she understands and agrees to plan.  Final Clinical Impressions(s) / ED Diagnoses   Final diagnoses:  Cellulitis of left lower extremity  Tinea pedis of left foot    ED Discharge Orders         Ordered    clotrimazole (LOTRIMIN) 1 % cream      06/28/19 1521    cephALEXin (KEFLEX) 500 MG capsule  4 times daily     06/28/19 1521           Dorean Daniello, PA-C 06/28/19 2308    Margette Fast, MD 06/29/19 [phone]

## 2019-06-28 NOTE — ED Triage Notes (Addendum)
Pt c/o ?insect bite between left 4th-5th toe 2 days ago-c/o itching, redness, swelling to area-swelling noted to left ankle which pt states was prior to ?insect bite-states she noticed area after dumping water that she was concerned mosquitoes were in -NAD-steady gait

## 2019-06-28 NOTE — Discharge Instructions (Signed)
Take antibiotics and use the cream as prescribed. Follow-up with your primary care doctor in 1 week if symptoms are not improving. Return to the emergency room if you develop high fevers, blood sugars are spiking despite medication, with any new, worsening, concerning symptoms.

## 2019-07-01 ENCOUNTER — Other Ambulatory Visit: Payer: Self-pay

## 2019-07-01 ENCOUNTER — Encounter (HOSPITAL_BASED_OUTPATIENT_CLINIC_OR_DEPARTMENT_OTHER): Payer: Self-pay | Admitting: Emergency Medicine

## 2019-07-01 ENCOUNTER — Emergency Department (HOSPITAL_BASED_OUTPATIENT_CLINIC_OR_DEPARTMENT_OTHER): Payer: Medicare HMO

## 2019-07-01 ENCOUNTER — Emergency Department (HOSPITAL_BASED_OUTPATIENT_CLINIC_OR_DEPARTMENT_OTHER)
Admission: EM | Admit: 2019-07-01 | Discharge: 2019-07-01 | Disposition: A | Discharge status: Home / Self Care | Payer: Medicare HMO | Attending: Emergency Medicine | Admitting: Emergency Medicine

## 2019-07-01 DIAGNOSIS — Z853 Personal history of malignant neoplasm of breast: Secondary | ICD-10-CM | POA: Diagnosis not present

## 2019-07-01 DIAGNOSIS — I10 Essential (primary) hypertension: Secondary | ICD-10-CM | POA: Diagnosis not present

## 2019-07-01 DIAGNOSIS — E119 Type 2 diabetes mellitus without complications: Secondary | ICD-10-CM | POA: Diagnosis not present

## 2019-07-01 DIAGNOSIS — L03116 Cellulitis of left lower limb: Secondary | ICD-10-CM | POA: Diagnosis not present

## 2019-07-01 DIAGNOSIS — Z79899 Other long term (current) drug therapy: Secondary | ICD-10-CM | POA: Diagnosis not present

## 2019-07-01 DIAGNOSIS — Z888 Allergy status to other drugs, medicaments and biological substances status: Secondary | ICD-10-CM | POA: Diagnosis not present

## 2019-07-01 DIAGNOSIS — R21 Rash and other nonspecific skin eruption: Secondary | ICD-10-CM | POA: Diagnosis present

## 2019-07-01 DIAGNOSIS — T7840XA Allergy, unspecified, initial encounter: Secondary | ICD-10-CM | POA: Diagnosis not present

## 2019-07-01 DIAGNOSIS — Z7984 Long term (current) use of oral hypoglycemic drugs: Secondary | ICD-10-CM | POA: Diagnosis not present

## 2019-07-01 DIAGNOSIS — M7989 Other specified soft tissue disorders: Secondary | ICD-10-CM | POA: Diagnosis not present

## 2019-07-01 LAB — CBC WITH DIFFERENTIAL/PLATELET
Abs Immature Granulocytes: 0.02 10*3/uL (ref 0.00–0.07)
Basophils Absolute: 0 10*3/uL (ref 0.0–0.1)
Basophils Relative: 0 %
Eosinophils Absolute: 0.3 10*3/uL (ref 0.0–0.5)
Eosinophils Relative: 4 %
HCT: 42 % (ref 36.0–46.0)
Hemoglobin: 13.8 g/dL (ref 12.0–15.0)
Immature Granulocytes: 0 %
Lymphocytes Relative: 34 %
Lymphs Abs: 2.4 10*3/uL (ref 0.7–4.0)
MCH: 31.4 pg (ref 26.0–34.0)
MCHC: 32.9 g/dL (ref 30.0–36.0)
MCV: 95.7 fL (ref 80.0–100.0)
Monocytes Absolute: 0.6 10*3/uL (ref 0.1–1.0)
Monocytes Relative: 8 %
Neutro Abs: 3.6 10*3/uL (ref 1.7–7.7)
Neutrophils Relative %: 54 %
Platelets: 277 10*3/uL (ref 150–400)
RBC: 4.39 MIL/uL (ref 3.87–5.11)
RDW: 12.8 % (ref 11.5–15.5)
WBC: 6.9 10*3/uL (ref 4.0–10.5)
nRBC: 0 % (ref 0.0–0.2)

## 2019-07-01 LAB — BASIC METABOLIC PANEL
Anion gap: 10 (ref 5–15)
BUN: 13 mg/dL (ref 8–23)
CO2: 25 mmol/L (ref 22–32)
Calcium: 8.8 mg/dL — ABNORMAL LOW (ref 8.9–10.3)
Chloride: 105 mmol/L (ref 98–111)
Creatinine, Ser: 0.89 mg/dL (ref 0.44–1.00)
GFR calc Af Amer: >60 mL/min (ref >60)
GFR calc non Af Amer: >60 mL/min (ref >60)
Glucose, Bld: 175 mg/dL — ABNORMAL HIGH (ref 70–99)
Potassium: 3.7 mmol/L (ref 3.5–5.1)
Sodium: 140 mmol/L (ref 135–145)

## 2019-07-01 LAB — LACTIC ACID, PLASMA: Lactic Acid, Venous: 0.9 mmol/L (ref 0.5–1.9)

## 2019-07-01 LAB — URINALYSIS, ROUTINE W REFLEX MICROSCOPIC
Bilirubin Urine: NEGATIVE
Glucose, UA: NEGATIVE mg/dL
Hgb urine dipstick: NEGATIVE
Ketones, ur: NEGATIVE mg/dL
Leukocytes,Ua: NEGATIVE
Nitrite: NEGATIVE
Protein, ur: NEGATIVE mg/dL
Specific Gravity, Urine: 1.02 (ref 1.005–1.030)
pH: 6.5 (ref 5.0–8.0)

## 2019-07-01 MED ORDER — DIPHENHYDRAMINE HCL 25 MG PO CAPS
50.0000 mg | ORAL_CAPSULE | Freq: Once | ORAL | Status: AC
  Administered 2019-07-01: 18:00:00 50 mg via ORAL
  Filled 2019-07-01: qty 2

## 2019-07-01 MED ORDER — DOXYCYCLINE HYCLATE 100 MG PO TABS
100.0000 mg | ORAL_TABLET | Freq: Once | ORAL | Status: AC
  Administered 2019-07-01: 21:00:00 100 mg via ORAL
  Filled 2019-07-01: qty 1

## 2019-07-01 MED ORDER — DOXYCYCLINE HYCLATE 100 MG PO CAPS: 100.0000 mg | ORAL_CAPSULE | Freq: Two times a day (BID) | ORAL | 0 refills | Status: AC

## 2019-07-01 MED ORDER — FAMOTIDINE 20 MG PO TABS
20.0000 mg | ORAL_TABLET | Freq: Once | ORAL | Status: AC
  Administered 2019-07-01: 20 mg via ORAL
  Filled 2019-07-01: qty 1

## 2019-07-01 NOTE — ED Provider Notes (Signed)
[facility]PART[facility] Provider Note   CSN: [id number] Arrival date & time: 07/01/19  1655    History   Chief Complaint Chief Complaint  Patient presents with  . Rash    HPI Katherine Pratt is a 68 y.o. female with PMHx DM and HTN who presents to the ED today complaining of an itchy rash to her LLE that began last night, worsening today. Pt was seen in the ED 3 days ago on 07/21 for swelling of her left foot and toes. Exam was consistent with tinea infection at that point although pt had erythema and warmth to the foot and lower leg at that point. There was concern for a superimposed infection for cellulitis. Lotrimin cream and Keflex was given considering pt has diabetes. She was told to return to the ED for any signs of worsening infection which prompted her to come to the ED today. Pt states that her leg started itching severely last night and it has now spread to her left forearm as well. No complaints of throat swelling, tongue swelling, lip swelling, shortness of breath, fever, chills.        Past Medical History:  Diagnosis Date  . Breast cancer ([place])   . Diabetes mellitus without complication ([place])   . Hypertension     There are no active problems to display for this patient.   Past Surgical History:  Procedure Laterality Date  . ABDOMINAL HYSTERECTOMY    . APPENDECTOMY    . BREAST LUMPECTOMY    . ELBOW SURGERY    . HERNIA REPAIR    . KNEE SURGERY       OB History   No obstetric history on file.      Home Medications    Prior to Admission medications   Medication Sig Start Date End Date Taking? Authorizing Provider  amLODipine (NORVASC) 2.5 MG tablet Take 2.5 mg by mouth daily.    [provider]  cephALEXin (KEFLEX) 500 MG capsule Take 1 capsule (500 mg total) by mouth 4 (four) times daily for 7 days. 06/28/19 07/05/19  Caccavale, Sophia, PA-C  clotrimazole (LOTRIMIN) 1 % cream Apply to affected area 2 times daily x1 wk 06/28/19    Caccavale, Sophia, PA-C  doxycycline (VIBRAMYCIN) 100 MG capsule Take 1 capsule (100 mg total) by mouth 2 (two) times daily for 7 days. 07/01/19 07/08/19  Alroy Bailiff, Belinda Bringhurst, PA-C  famotidine (PEPCID) 20 MG tablet Take 1 tablet (20 mg total) by mouth 2 (two) times daily. 07/19/14   Palumbo, April, MD  GLIPIZIDE ER PO Take by mouth.    [provider]  meclizine (ANTIVERT) 25 MG tablet Take 1 tablet (25 mg total) by mouth 3 (three) times daily as needed for dizziness. 12/29/17   Molpus, John, MD  METFORMIN HCL PO Take by mouth.    [provider]  ondansetron (ZOFRAN ODT) 8 MG disintegrating tablet Take 1 tablet (8 mg total) by mouth every 8 (eight) hours as needed for nausea or vomiting. 10/28/17   Molpus, John, MD  oxyCODONE (ROXICODONE) 5 MG immediate release tablet Take 1 tablet (5 mg total) by mouth every 4 (four) hours as needed for severe pain. 04/11/19   Gareth Morgan, MD    Family History No family history on file.  Social History Social History   Tobacco Use  . Smoking status: Never Smoker  . Smokeless tobacco: Never Used  Substance Use Topics  . Alcohol use: No  . Drug use: No  Allergies   Losartan   Review of Systems Review of Systems  Constitutional: Negative for chills and fever.  HENT: Negative for facial swelling and trouble swallowing.   Eyes: Negative for visual disturbance.  Respiratory: Negative for cough and shortness of breath.   Gastrointestinal: Negative for abdominal pain, nausea and vomiting.  Genitourinary: Negative for difficulty urinating.  Musculoskeletal: Negative for myalgias.  Skin: Positive for color change and rash.  Neurological: Negative for headaches.     Physical Exam Updated Vital Signs BP (!) 180/78   Pulse 79   Temp 99.1 F (37.3 C) (Oral)   Resp 16   Ht 5\' 5"  (1.651 m)   Wt 114.1 kg   SpO2 100%   BMI 41.86 kg/m   Physical Exam Vitals signs and nursing note reviewed.  Constitutional:      Appearance:  She is not ill-appearing.  HENT:     Head: Normocephalic and atraumatic.  Eyes:     Conjunctiva/sclera: Conjunctivae normal.  Neck:     Musculoskeletal: Neck supple.  Cardiovascular:     Rate and Rhythm: Normal rate and regular rhythm.  Pulmonary:     Effort: Pulmonary effort is normal.     Breath sounds: Normal breath sounds.  Abdominal:     Palpations: Abdomen is soft.     Tenderness: There is no abdominal tenderness.  Musculoskeletal:     Comments: Small patch of erythema and edema noted to dorsum of left foot between 4th and 5th digits without TTP; pt endorses "itching" to the area.  Maculopapular erythematous rash to anterior aspect of left lower leg distally. Erythema noted to calf without TTP.  ROM intact throughout. Strength and sensation intact. 2+ DP and PT pulses.   Skin:    General: Skin is warm and dry.  Neurological:     Mental Status: She is alert.      ED Treatments / Results  Labs (all labs ordered are listed, but only abnormal results are displayed) Labs Reviewed  BASIC METABOLIC PANEL - Abnormal; Notable for the following components:      Result Value   Glucose, Bld 175 (*)    Calcium 8.8 (*)    All other components within normal limits  CBC WITH DIFFERENTIAL/PLATELET  LACTIC ACID, PLASMA  URINALYSIS, ROUTINE W REFLEX MICROSCOPIC  LACTIC ACID, PLASMA    EKG None  Radiology Dg Tibia/fibula Left  Result Date: 07/01/2019 CLINICAL DATA:  Redness and swelling, left lower extremity, especially between the fourth and fifth toes. Evaluate for osteomyelitis. EXAM: LEFT TIBIA AND FIBULA - 2 VIEW; LEFT ANKLE COMPLETE - 3+ VIEW; LEFT FOOT - COMPLETE 3+ VIEW COMPARISON:  09/24/2017 FINDINGS: No fracture or dislocation of the left tibia or fibula. Moderate arthrosis of the included left knee. No fracture or dislocation of the left ankle. There is moderate to severe ankle joint arthrosis. Diffuse soft tissue edema about the ankle. No fracture or dislocation of the  right foot. Joint spaces are well preserved. No evidence of bony erosion or sclerosis to suggest osteomyelitis. Diffuse soft tissue edema about the foot. IMPRESSION: 1. No fracture or dislocation of the left tibia or fibula. Moderate arthrosis of the included left knee. 2. No fracture or dislocation of the left ankle. There is moderate to severe ankle joint arthrosis. Diffuse soft tissue edema about the ankle. 3. No fracture or dislocation of the right foot. Joint spaces are well preserved. No evidence of bony erosion or sclerosis to suggest osteomyelitis. Diffuse soft tissue edema about the  foot. Consider contrast enhanced MRI to more sensitively evaluate for bone marrow edema and osteomyelitis if suspected. Electronically Signed   By: Eddie Candle M.D.   On: 07/01/2019 20:45   Dg Ankle Complete Left  Result Date: 07/01/2019 CLINICAL DATA:  Redness and swelling, left lower extremity, especially between the fourth and fifth toes. Evaluate for osteomyelitis. EXAM: LEFT TIBIA AND FIBULA - 2 VIEW; LEFT ANKLE COMPLETE - 3+ VIEW; LEFT FOOT - COMPLETE 3+ VIEW COMPARISON:  09/24/2017 FINDINGS: No fracture or dislocation of the left tibia or fibula. Moderate arthrosis of the included left knee. No fracture or dislocation of the left ankle. There is moderate to severe ankle joint arthrosis. Diffuse soft tissue edema about the ankle. No fracture or dislocation of the right foot. Joint spaces are well preserved. No evidence of bony erosion or sclerosis to suggest osteomyelitis. Diffuse soft tissue edema about the foot. IMPRESSION: 1. No fracture or dislocation of the left tibia or fibula. Moderate arthrosis of the included left knee. 2. No fracture or dislocation of the left ankle. There is moderate to severe ankle joint arthrosis. Diffuse soft tissue edema about the ankle. 3. No fracture or dislocation of the right foot. Joint spaces are well preserved. No evidence of bony erosion or sclerosis to suggest osteomyelitis.  Diffuse soft tissue edema about the foot. Consider contrast enhanced MRI to more sensitively evaluate for bone marrow edema and osteomyelitis if suspected. Electronically Signed   By: Eddie Candle M.D.   On: 07/01/2019 20:45   Dg Foot Complete Left  Result Date: 07/01/2019 CLINICAL DATA:  Redness and swelling, left lower extremity, especially between the fourth and fifth toes. Evaluate for osteomyelitis. EXAM: LEFT TIBIA AND FIBULA - 2 VIEW; LEFT ANKLE COMPLETE - 3+ VIEW; LEFT FOOT - COMPLETE 3+ VIEW COMPARISON:  09/24/2017 FINDINGS: No fracture or dislocation of the left tibia or fibula. Moderate arthrosis of the included left knee. No fracture or dislocation of the left ankle. There is moderate to severe ankle joint arthrosis. Diffuse soft tissue edema about the ankle. No fracture or dislocation of the right foot. Joint spaces are well preserved. No evidence of bony erosion or sclerosis to suggest osteomyelitis. Diffuse soft tissue edema about the foot. IMPRESSION: 1. No fracture or dislocation of the left tibia or fibula. Moderate arthrosis of the included left knee. 2. No fracture or dislocation of the left ankle. There is moderate to severe ankle joint arthrosis. Diffuse soft tissue edema about the ankle. 3. No fracture or dislocation of the right foot. Joint spaces are well preserved. No evidence of bony erosion or sclerosis to suggest osteomyelitis. Diffuse soft tissue edema about the foot. Consider contrast enhanced MRI to more sensitively evaluate for bone marrow edema and osteomyelitis if suspected. Electronically Signed   By: Eddie Candle M.D.   On: 07/01/2019 20:45    Procedures Procedures (including critical care time)  Medications Ordered in ED Medications  famotidine (PEPCID) tablet 20 mg (20 mg Oral Given 07/01/19 1822)  diphenhydrAMINE (BENADRYL) capsule 50 mg (50 mg Oral Given 07/01/19 1821)  doxycycline (VIBRA-TABS) tablet 100 mg (100 mg Oral Given 07/01/19 2100)     Initial  Impression / Assessment and Plan / ED Course  I have reviewed the triage vital signs and the nursing notes.  Pertinent labs & imaging results that were available during my care of the patient were reviewed by me and considered in my medical decision making (see chart for details).    68 year old female  presenting to the ED with rash to left lower extremity that began last night, reports "itching." Recently seen in the ED 3 days ago for itching between 4th and 5th toe on left foot; diagnosed with tinea infection although pt had erythema to distal LLE at that time with concern for cellulitis. Started on keflex given hx of diabetes. Pt reports last night she began having a rash to the left lower extremity over the area where it was noted to have erythema. The rash has since spread to her left arm. No new changes except for antibiotic Keflex. Pt does not believe she has ever taken it in the past. Denies new foods or hygiene products. No complaints of airway involvement or angioedema. PO benadryl and pepcid given in the ED with improvement in complaints of itching.   On exam patient also complains of new redness to the dorsum of her foot as well as posterior distal leg. Questionable worsening cellulitis. On arrival pt temp 99.1 although without tachycardia or tachypnea. Given worsening redness and spreading up the leg work up initiated including CBC, BMP, lactic acid which were all reassuring. Xrays ordered as well to rule out osteo given hx of diabetes - negative today. Areas of erythema marked with surgical pen. Advised patient to return to the ED if any redness passed the pen mark. She is advised to discontinue use of keflex. Changed to doxycycline instead. Pt advised to follow up with her PCP. She is in agreement with plan at this time and stable for discharge home.       Final Clinical Impressions(s) / ED Diagnoses   Final diagnoses:  Cellulitis of left lower extremity  Allergic reaction, initial  encounter    ED Discharge Orders         Ordered    doxycycline (VIBRAMYCIN) 100 MG capsule  2 times daily     07/01/19 2102           Eustaquio Maize, PA-C 07/01/19 2128    Charlesetta Shanks, MD 07/08/19 [phone]

## 2019-07-01 NOTE — ED Triage Notes (Signed)
Seen here 3 days ago for cellulitis on left foot.  Last night pruritic rash came up on left ankle.  This morning pruritic rash came up on forearms.

## 2019-07-01 NOTE — Discharge Instructions (Signed)
You were seen in the ED today for itching rash to your leg as well as worsening redness to your leg Your labwork was reassuring today You may have had a reaction to the antibiotic that you were prescribed a few days ago PLEASE STOP TAKING CEPHALEXIN (KEFLEX) IMMEDIATELY I have switched you to a different antibiotic (Doxycycline) that you will take for the next week The redness to your leg has been marked with a pen. If the redness spreads past these lines please return to the ED immediately Continue taking Benadryl nightly for the next few days to help with the allergic respone Please follow up with your PCP

## 2019-07-01 NOTE — ED Notes (Signed)
ED Provider at bedside. 

## 2019-07-02 ENCOUNTER — Other Ambulatory Visit: Payer: Self-pay

## 2019-07-02 ENCOUNTER — Encounter (HOSPITAL_BASED_OUTPATIENT_CLINIC_OR_DEPARTMENT_OTHER): Payer: Self-pay | Admitting: *Deleted

## 2019-07-02 ENCOUNTER — Emergency Department (HOSPITAL_BASED_OUTPATIENT_CLINIC_OR_DEPARTMENT_OTHER)
Admission: EM | Admit: 2019-07-02 | Discharge: 2019-07-02 | Disposition: A | Discharge status: Home / Self Care | Payer: Medicare HMO | Attending: Emergency Medicine | Admitting: Emergency Medicine

## 2019-07-02 DIAGNOSIS — Z79899 Other long term (current) drug therapy: Secondary | ICD-10-CM | POA: Diagnosis not present

## 2019-07-02 DIAGNOSIS — R21 Rash and other nonspecific skin eruption: Secondary | ICD-10-CM | POA: Insufficient documentation

## 2019-07-02 DIAGNOSIS — T7840XA Allergy, unspecified, initial encounter: Secondary | ICD-10-CM

## 2019-07-02 DIAGNOSIS — I10 Essential (primary) hypertension: Secondary | ICD-10-CM | POA: Diagnosis not present

## 2019-07-02 DIAGNOSIS — E119 Type 2 diabetes mellitus without complications: Secondary | ICD-10-CM | POA: Insufficient documentation

## 2019-07-02 DIAGNOSIS — L509 Urticaria, unspecified: Secondary | ICD-10-CM

## 2019-07-02 DIAGNOSIS — T490X5A Adverse effect of local antifungal, anti-infective and anti-inflammatory drugs, initial encounter: Secondary | ICD-10-CM | POA: Diagnosis not present

## 2019-07-02 DIAGNOSIS — Z7984 Long term (current) use of oral hypoglycemic drugs: Secondary | ICD-10-CM | POA: Diagnosis not present

## 2019-07-02 LAB — CBC WITH DIFFERENTIAL/PLATELET
Abs Immature Granulocytes: 0.01 10*3/uL (ref 0.00–0.07)
Basophils Absolute: 0 10*3/uL (ref 0.0–0.1)
Basophils Relative: 0 %
Eosinophils Absolute: 0.3 10*3/uL (ref 0.0–0.5)
Eosinophils Relative: 5 %
HCT: 41.8 % (ref 36.0–46.0)
Hemoglobin: 13.8 g/dL (ref 12.0–15.0)
Immature Granulocytes: 0 %
Lymphocytes Relative: 35 %
Lymphs Abs: 2.2 10*3/uL (ref 0.7–4.0)
MCH: 31.6 pg (ref 26.0–34.0)
MCHC: 33 g/dL (ref 30.0–36.0)
MCV: 95.7 fL (ref 80.0–100.0)
Monocytes Absolute: 0.4 10*3/uL (ref 0.1–1.0)
Monocytes Relative: 6 %
Neutro Abs: 3.5 10*3/uL (ref 1.7–7.7)
Neutrophils Relative %: 54 %
Platelets: 284 10*3/uL (ref 150–400)
RBC: 4.37 MIL/uL (ref 3.87–5.11)
RDW: 12.6 % (ref 11.5–15.5)
WBC: 6.4 10*3/uL (ref 4.0–10.5)
nRBC: 0 % (ref 0.0–0.2)

## 2019-07-02 LAB — COMPREHENSIVE METABOLIC PANEL
ALT: 19 U/L (ref 0–44)
AST: 18 U/L (ref 15–41)
Albumin: 3.6 g/dL (ref 3.5–5.0)
Alkaline Phosphatase: 102 U/L (ref 38–126)
Anion gap: 10 (ref 5–15)
BUN: 10 mg/dL (ref 8–23)
CO2: 26 mmol/L (ref 22–32)
Calcium: 8.6 mg/dL — ABNORMAL LOW (ref 8.9–10.3)
Chloride: 104 mmol/L (ref 98–111)
Creatinine, Ser: 0.82 mg/dL (ref 0.44–1.00)
GFR calc Af Amer: >60 mL/min (ref >60)
GFR calc non Af Amer: >60 mL/min (ref >60)
Glucose, Bld: 154 mg/dL — ABNORMAL HIGH (ref 70–99)
Potassium: 3.4 mmol/L — ABNORMAL LOW (ref 3.5–5.1)
Sodium: 140 mmol/L (ref 135–145)
Total Bilirubin: 0.9 mg/dL (ref 0.3–1.2)
Total Protein: 6.7 g/dL (ref 6.5–8.1)

## 2019-07-02 MED ORDER — FAMOTIDINE IN NACL 20-0.9 MG/50ML-% IV SOLN
20.0000 mg | Freq: Once | INTRAVENOUS | Status: AC
  Administered 2019-07-02: 20 mg via INTRAVENOUS
  Filled 2019-07-02: qty 50

## 2019-07-02 MED ORDER — DIPHENHYDRAMINE HCL 50 MG/ML IJ SOLN
50.0000 mg | Freq: Once | INTRAMUSCULAR | Status: AC
  Administered 2019-07-02: 50 mg via INTRAVENOUS
  Filled 2019-07-02: qty 1

## 2019-07-02 MED ORDER — DIPHENHYDRAMINE HCL 25 MG PO TABS: 50.0000 mg | ORAL_TABLET | Freq: Four times a day (QID) | ORAL | 0 refills | Status: DC | PRN

## 2019-07-02 NOTE — ED Notes (Signed)
Pt verbalized understanding of dc instructions.

## 2019-07-02 NOTE — ED Triage Notes (Signed)
Pt seen here this week for cellulitis and rash on left foot which has not spread up leg and to her arms. Reports seen here yesterday for same. States the rash is worse today and her lips are swelling. She didn't take antibiotics today

## 2019-07-02 NOTE — ED Provider Notes (Signed)
[facility] Provider Note   CSN: [id number] Arrival date & time: 07/02/19  1430     History   Chief Complaint Chief Complaint  Patient presents with   Rash    HPI Wendell Fiebig is a 68 y.o. female history diabetes, breast cancer, hypertension here presenting with rash.  Patient came into the ED 4 days ago and was thought to have a fungal infection of the foot with possible cellulitis so started on Lotrimin as well as Keflex.  Patient states that for the last several days, the left lower leg is more red and itchy so came in yesterday.  She was seen in the ED and had normal CBC and normal lactate and normal x-rays.  She was given a dose of doxycycline but did not fill her prescription.  She states that she woke up this morning and noticed that she has diffuse hives in her arms and legs and has some subjective lip swelling.  Patient denies any trouble breathing. She is not on any ACE inhibitors.     The history is provided by the patient.    Past Medical History:  Diagnosis Date   Breast cancer ([place])    Diabetes mellitus without complication ([place])    Hypertension     There are no active problems to display for this patient.   Past Surgical History:  Procedure Laterality Date   ABDOMINAL HYSTERECTOMY     APPENDECTOMY     BREAST LUMPECTOMY     ELBOW SURGERY     HERNIA REPAIR     KNEE SURGERY       OB History   No obstetric history on file.      Home Medications    Prior to Admission medications   Medication Sig Start Date End Date Taking? Authorizing Provider  amLODipine (NORVASC) 2.5 MG tablet Take 2.5 mg by mouth daily.    [provider]  cephALEXin (KEFLEX) 500 MG capsule Take 1 capsule (500 mg total) by mouth 4 (four) times daily for 7 days. 06/28/19 07/05/19  Caccavale, Sophia, PA-C  clotrimazole (LOTRIMIN) 1 % cream Apply to affected area 2 times daily x1 wk 06/28/19   Caccavale, Sophia, PA-C  doxycycline  (VIBRAMYCIN) 100 MG capsule Take 1 capsule (100 mg total) by mouth 2 (two) times daily for 7 days. 07/01/19 07/08/19  Alroy Bailiff, Margaux, PA-C  famotidine (PEPCID) 20 MG tablet Take 1 tablet (20 mg total) by mouth 2 (two) times daily. 07/19/14   Palumbo, April, MD  GLIPIZIDE ER PO Take by mouth.    [provider]  meclizine (ANTIVERT) 25 MG tablet Take 1 tablet (25 mg total) by mouth 3 (three) times daily as needed for dizziness. 12/29/17   Molpus, John, MD  METFORMIN HCL PO Take by mouth.    [provider]  ondansetron (ZOFRAN ODT) 8 MG disintegrating tablet Take 1 tablet (8 mg total) by mouth every 8 (eight) hours as needed for nausea or vomiting. 10/28/17   Molpus, John, MD  oxyCODONE (ROXICODONE) 5 MG immediate release tablet Take 1 tablet (5 mg total) by mouth every 4 (four) hours as needed for severe pain. 04/11/19   Gareth Morgan, MD    Family History No family history on file.  Social History Social History   Tobacco Use   Smoking status: Never Smoker   Smokeless tobacco: Never Used  Substance Use Topics   Alcohol use: No   Drug use: No     Allergies  Keflex [cephalexin] and Losartan   Review of Systems Review of Systems  Skin: Positive for rash.  All other systems reviewed and are negative.    Physical Exam Updated Vital Signs BP (!) 151/73 (BP Location: Left Arm)    Pulse 77    Temp 98.6 F (37 C) (Oral)    Resp 20    Ht 5\' 5"  (1.651 m)    Wt 113.9 kg    SpO2 100%    BMI 41.77 kg/m   Physical Exam Vitals signs and nursing note reviewed.  Constitutional:      Appearance: Normal appearance.  HENT:     Head: Normocephalic.     Comments: ? Upper and lower lip swelling, no obvious angioedema. No soft palate swelling and posterior pharynx is clear      Nose: Nose normal.     Mouth/Throat:     Mouth: Mucous membranes are moist.  Eyes:     Extraocular Movements: Extraocular movements intact.     Pupils: Pupils are equal, round, and reactive to  light.  Neck:     Musculoskeletal: Normal range of motion and neck supple.  Cardiovascular:     Rate and Rhythm: Normal rate and regular rhythm.  Pulmonary:     Effort: Pulmonary effort is normal.     Breath sounds: Normal breath sounds.  Abdominal:     General: Abdomen is flat.     Palpations: Abdomen is soft.  Musculoskeletal: Normal range of motion.  Lymphadenopathy:     Cervical: No cervical adenopathy.  Skin:    Capillary Refill: Capillary refill takes less than 2 seconds.     Comments: Diffuse urticaria on arms, legs, back, and torso.  [place] between the left fourth and fifth toes with some erythema, no obvious whitish discharge. L tibia with maculopapular rash but no obvious cellulitis.  No petechiae or purpura.  Neurological:     General: No focal deficit present.     Mental Status: She is alert.  Psychiatric:        Mood and Affect: Mood normal.      ED Treatments / Results  Labs (all labs ordered are listed, but only abnormal results are displayed) Labs Reviewed  COMPREHENSIVE METABOLIC PANEL - Abnormal; Notable for the following components:      Result Value   Potassium 3.4 (*)    Glucose, Bld 154 (*)    Calcium 8.6 (*)    All other components within normal limits  CBC WITH DIFFERENTIAL/PLATELET    EKG None  Radiology Dg Tibia/fibula Left  Result Date: 07/01/2019 CLINICAL DATA:  Redness and swelling, left lower extremity, especially between the fourth and fifth toes. Evaluate for osteomyelitis. EXAM: LEFT TIBIA AND FIBULA - 2 VIEW; LEFT ANKLE COMPLETE - 3+ VIEW; LEFT FOOT - COMPLETE 3+ VIEW COMPARISON:  09/24/2017 FINDINGS: No fracture or dislocation of the left tibia or fibula. Moderate arthrosis of the included left knee. No fracture or dislocation of the left ankle. There is moderate to severe ankle joint arthrosis. Diffuse soft tissue edema about the ankle. No fracture or dislocation of the right foot. Joint spaces are well preserved. No evidence of bony  erosion or sclerosis to suggest osteomyelitis. Diffuse soft tissue edema about the foot. IMPRESSION: 1. No fracture or dislocation of the left tibia or fibula. Moderate arthrosis of the included left knee. 2. No fracture or dislocation of the left ankle. There is moderate to severe ankle joint arthrosis. Diffuse soft tissue edema about the ankle. 3.  No fracture or dislocation of the right foot. Joint spaces are well preserved. No evidence of bony erosion or sclerosis to suggest osteomyelitis. Diffuse soft tissue edema about the foot. Consider contrast enhanced MRI to more sensitively evaluate for bone marrow edema and osteomyelitis if suspected. Electronically Signed   By: Eddie Candle M.D.   On: 07/01/2019 20:45   Dg Ankle Complete Left  Result Date: 07/01/2019 CLINICAL DATA:  Redness and swelling, left lower extremity, especially between the fourth and fifth toes. Evaluate for osteomyelitis. EXAM: LEFT TIBIA AND FIBULA - 2 VIEW; LEFT ANKLE COMPLETE - 3+ VIEW; LEFT FOOT - COMPLETE 3+ VIEW COMPARISON:  09/24/2017 FINDINGS: No fracture or dislocation of the left tibia or fibula. Moderate arthrosis of the included left knee. No fracture or dislocation of the left ankle. There is moderate to severe ankle joint arthrosis. Diffuse soft tissue edema about the ankle. No fracture or dislocation of the right foot. Joint spaces are well preserved. No evidence of bony erosion or sclerosis to suggest osteomyelitis. Diffuse soft tissue edema about the foot. IMPRESSION: 1. No fracture or dislocation of the left tibia or fibula. Moderate arthrosis of the included left knee. 2. No fracture or dislocation of the left ankle. There is moderate to severe ankle joint arthrosis. Diffuse soft tissue edema about the ankle. 3. No fracture or dislocation of the right foot. Joint spaces are well preserved. No evidence of bony erosion or sclerosis to suggest osteomyelitis. Diffuse soft tissue edema about the foot. Consider contrast  enhanced MRI to more sensitively evaluate for bone marrow edema and osteomyelitis if suspected. Electronically Signed   By: Eddie Candle M.D.   On: 07/01/2019 20:45   Dg Foot Complete Left  Result Date: 07/01/2019 CLINICAL DATA:  Redness and swelling, left lower extremity, especially between the fourth and fifth toes. Evaluate for osteomyelitis. EXAM: LEFT TIBIA AND FIBULA - 2 VIEW; LEFT ANKLE COMPLETE - 3+ VIEW; LEFT FOOT - COMPLETE 3+ VIEW COMPARISON:  09/24/2017 FINDINGS: No fracture or dislocation of the left tibia or fibula. Moderate arthrosis of the included left knee. No fracture or dislocation of the left ankle. There is moderate to severe ankle joint arthrosis. Diffuse soft tissue edema about the ankle. No fracture or dislocation of the right foot. Joint spaces are well preserved. No evidence of bony erosion or sclerosis to suggest osteomyelitis. Diffuse soft tissue edema about the foot. IMPRESSION: 1. No fracture or dislocation of the left tibia or fibula. Moderate arthrosis of the included left knee. 2. No fracture or dislocation of the left ankle. There is moderate to severe ankle joint arthrosis. Diffuse soft tissue edema about the ankle. 3. No fracture or dislocation of the right foot. Joint spaces are well preserved. No evidence of bony erosion or sclerosis to suggest osteomyelitis. Diffuse soft tissue edema about the foot. Consider contrast enhanced MRI to more sensitively evaluate for bone marrow edema and osteomyelitis if suspected. Electronically Signed   By: Eddie Candle M.D.   On: 07/01/2019 20:45    Procedures Procedures (including critical care time)  Medications Ordered in ED Medications  famotidine (PEPCID) IVPB 20 mg premix (20 mg Intravenous New Bag/Given 07/02/19 1614)  diphenhydrAMINE (BENADRYL) injection 50 mg (50 mg Intravenous Given 07/02/19 1611)     Initial Impression / Assessment and Plan / ED Course  I have reviewed the triage vital signs and the nursing  notes.  Pertinent labs & imaging results that were available during my care of the patient were reviewed by  me and considered in my medical decision making (see chart for details).       Tyrell Brereton is a 68 y.o. female here with worsening rash and subjective lip swelling.  I do not see obvious angioedema her lungs are clear and posterior pharynx is clear.  She does have diffuse hives however.  I wonder if she had a allergic reaction either to the Keflex that was given 4 days ago or the doxycycline that was given yesterday.  She does not have a obvious fungal infection or cellulitis right now.  Repeat CBC and will give some benadryl and pepcid.   5:06 PM CBC is normal today.  Patient's urticaria improved in the ED after Benadryl and Pepcid.  She does have PCP follow-up early next week.  Will hold off on any steroids right now as she recently was diagnosed with possible cellulitis and fungal infection.  Her lip swelling is also improved.  I told her that she needs to follow-up with her doctor in 2 days as well as refer her to podiatrist for follow-up for her foot.    Final Clinical Impressions(s) / ED Diagnoses   Final diagnoses:  None    ED Discharge Orders    None       Drenda Freeze, MD 07/02/19 [phone]

## 2019-07-02 NOTE — Discharge Instructions (Addendum)
You likely have an allergic reaction to antibiotics. Please STOP taking the antibiotics   Take benadryl 50 mg every 6 hrs for the next 2 days   You need to see your doctor in 2-3 days for follow up.   See podiatrist for follow up as well   Return to ER if you have worse rash, lip swelling, trouble breathing.

## 2019-07-02 NOTE — ED Notes (Signed)
ED Provider at bedside. 

## 2019-07-08 ENCOUNTER — Encounter: Payer: Self-pay | Admitting: Podiatry

## 2019-07-08 ENCOUNTER — Ambulatory Visit: Payer: Medicare HMO | Admitting: Podiatry

## 2019-07-08 ENCOUNTER — Other Ambulatory Visit: Payer: Self-pay

## 2019-07-08 ENCOUNTER — Other Ambulatory Visit: Payer: Self-pay | Admitting: Podiatry

## 2019-07-08 ENCOUNTER — Ambulatory Visit (INDEPENDENT_AMBULATORY_CARE_PROVIDER_SITE_OTHER): Payer: Medicare HMO

## 2019-07-08 VITALS — BP 153/67 | Temp 96.8°F

## 2019-07-08 DIAGNOSIS — L03032 Cellulitis of left toe: Secondary | ICD-10-CM

## 2019-07-08 DIAGNOSIS — L97521 Non-pressure chronic ulcer of other part of left foot limited to breakdown of skin: Secondary | ICD-10-CM

## 2019-07-08 DIAGNOSIS — L02612 Cutaneous abscess of left foot: Secondary | ICD-10-CM | POA: Diagnosis not present

## 2019-07-08 DIAGNOSIS — M79672 Pain in left foot: Secondary | ICD-10-CM

## 2019-07-08 NOTE — Patient Instructions (Signed)

## 2019-07-08 NOTE — Progress Notes (Signed)
Subjective:  Patient ID: Katherine Pratt, female    DOB: 1951-01-18,  MRN: [id number]  Chief Complaint  Patient presents with   Foot Ulcer    left foot ulcer between toes 4 and 5, pt states that she went to the emergency room on the 24th of july, pt states that it feels like a ithcing and warm sensation    68 y.o. female presents for wound care. Not sure how the area started but states she was working outside and her foot got wet and could have caused it. Started on doxycycline for cellulitis states the foot and leg got much better. Has about 7 more days left.  Review of Systems: Negative except as noted in the HPI. Denies N/V/F/Ch.  Past Medical History:  Diagnosis Date   Breast cancer ([place])    Diabetes mellitus without complication ([place])    Hypertension     Current Outpatient Medications:    amLODipine (NORVASC) 2.5 MG tablet, Take 2.5 mg by mouth daily., Disp: , Rfl:    clotrimazole (LOTRIMIN) 1 % cream, Apply to affected area 2 times daily x1 wk, Disp: 15 g, Rfl: 0   diphenhydrAMINE (BENADRYL) 25 MG tablet, Take 2 tablets (50 mg total) by mouth every 6 (six) hours as needed., Disp: 30 tablet, Rfl: 0   doxycycline (VIBRAMYCIN) 100 MG capsule, Take 1 capsule (100 mg total) by mouth 2 (two) times daily for 7 days., Disp: 14 capsule, Rfl: 0   famotidine (PEPCID) 20 MG tablet, Take 1 tablet (20 mg total) by mouth 2 (two) times daily., Disp: 30 tablet, Rfl: 0   GLIPIZIDE ER PO, Take by mouth., Disp: , Rfl:    meclizine (ANTIVERT) 25 MG tablet, Take 1 tablet (25 mg total) by mouth 3 (three) times daily as needed for dizziness., Disp: 30 tablet, Rfl: 0   METFORMIN HCL PO, Take by mouth., Disp: , Rfl:    ondansetron (ZOFRAN ODT) 8 MG disintegrating tablet, Take 1 tablet (8 mg total) by mouth every 8 (eight) hours as needed for nausea or vomiting., Disp: 10 tablet, Rfl: 0   oxyCODONE (ROXICODONE) 5 MG immediate release tablet, Take 1 tablet (5 mg total) by mouth every 4 (four)  hours as needed for severe pain., Disp: 15 tablet, Rfl: 0  Social History   Tobacco Use  Smoking Status Never Smoker  Smokeless Tobacco Never Used    Allergies  Allergen Reactions   Aspirin Other (See Comments)    On meloxicam   Keflex [Cephalexin] Hives   Losartan Rash   Objective:   Vitals:   07/08/19 0946  BP: (!) 153/67  Temp: (!) 96.8 F (36 C)   There is no height or weight on file to calculate BMI. Constitutional Well developed. Well nourished.  Vascular Dorsalis pedis pulses palpable bilaterally. Posterior tibial pulses palpable bilaterally. Capillary refill normal to all digits.  No cyanosis or clubbing noted. Pedal hair growth normal.  Neurologic Normal speech. Oriented to person, place, and time. Protective sensation absent  Dermatologic Fluctuance noted left 4th interspace with macerated interspace. White purulent and casseous material upon I and D. Underlying skin healthy with granular base.  Orthopedic: No pain to palpation either foot.   Radiographs: taken and reviewed no osseous erosion. Assessment:   1. Chronic ulcer of great toe of left foot, limited to breakdown of skin ([place])   2. Foot pain, left   3. Cellulitis and abscess of toe of left foot    Plan:  Patient was evaluated and treated  and all questions answered.  Abscess Left 4th Interspace -I&D as below. -Dressed with gauze, DSD. -Dispense off-loading surgical shoe.  Procedure: I and D Anesthesia: None Instrumentation: 312 blade, tissue nipper  Technique: following sterile skin prep, the area was incised. White purulent material was noted and collected for culture. Purulence was maximally expressed. The area was thoroughly cleansed and dressed with betadine and gauze.  Dressing: Dry, sterile, compression dressing. Disposition: Patient tolerated procedure well. Patient to return in 1 week for follow-up.   Return in about 2 weeks (around 07/22/2019).

## 2019-07-08 NOTE — Addendum Note (Signed)
Addended by: Dorris Singh on: 07/08/2019 01:16 PM   Modules accepted: Orders

## 2019-07-22 ENCOUNTER — Other Ambulatory Visit: Payer: Self-pay

## 2019-07-22 ENCOUNTER — Ambulatory Visit: Payer: Medicare HMO | Admitting: Podiatry

## 2019-07-22 VITALS — Temp 97.1°F

## 2019-07-22 DIAGNOSIS — L97521 Non-pressure chronic ulcer of other part of left foot limited to breakdown of skin: Secondary | ICD-10-CM | POA: Diagnosis not present

## 2019-07-22 DIAGNOSIS — L02612 Cutaneous abscess of left foot: Secondary | ICD-10-CM | POA: Diagnosis not present

## 2019-07-22 DIAGNOSIS — L03032 Cellulitis of left toe: Secondary | ICD-10-CM

## 2019-07-22 NOTE — Progress Notes (Signed)
Subjective:  Patient ID: Katherine Pratt, female    DOB: [birth year],  MRN: [id number]  Chief Complaint  Patient presents with  . Wound Check    Left 4/5 interspace wound check. Pt denies drainage, denies fever/nausea/vomiting/chills. States no concerns.    68 y.o. female presents for wound care.  Doing well no pain states that the area is not draining or causing issues  Review of Systems: Negative except as noted in the HPI. Denies N/V/F/Ch.  Past Medical History:  Diagnosis Date  . Breast cancer ([place])   . Diabetes mellitus without complication ([place])   . Hypertension     Current Outpatient Medications:  .  amLODipine (NORVASC) 10 MG tablet, , Disp: , Rfl:  .  amLODipine (NORVASC) 2.5 MG tablet, Take 2.5 mg by mouth daily., Disp: , Rfl:  .  atorvastatin (LIPITOR) 40 MG tablet, Take by mouth., Disp: , Rfl:  .  clotrimazole (LOTRIMIN) 1 % cream, Apply to affected area 2 times daily x1 wk, Disp: 15 g, Rfl: 0 .  diphenhydrAMINE (BENADRYL) 25 MG tablet, Take 2 tablets (50 mg total) by mouth every 6 (six) hours as needed., Disp: 30 tablet, Rfl: 0 .  EPINEPHrine 0.3 mg/0.3 mL IJ SOAJ injection, 0.3 mg as needed., Disp: , Rfl:  .  famotidine (PEPCID) 20 MG tablet, Take 1 tablet (20 mg total) by mouth 2 (two) times daily., Disp: 30 tablet, Rfl: 0 .  GLIPIZIDE ER PO, Take by mouth., Disp: , Rfl:  .  meclizine (ANTIVERT) 25 MG tablet, Take 1 tablet (25 mg total) by mouth 3 (three) times daily as needed for dizziness., Disp: 30 tablet, Rfl: 0 .  metFORMIN (GLUCOPHAGE-XR) 500 MG 24 hr tablet, , Disp: , Rfl:  .  METFORMIN HCL PO, Take by mouth., Disp: , Rfl:  .  ondansetron (ZOFRAN ODT) 8 MG disintegrating tablet, Take 1 tablet (8 mg total) by mouth every 8 (eight) hours as needed for nausea or vomiting., Disp: 10 tablet, Rfl: 0 .  oxyCODONE (ROXICODONE) 5 MG immediate release tablet, Take 1 tablet (5 mg total) by mouth every 4 (four) hours as needed for severe pain., Disp: 15 tablet, Rfl: 0 .   pioglitazone (ACTOS) 30 MG tablet, , Disp: , Rfl:  .  TRULICITY 1.5 TO/6.7TI SOPN, , Disp: , Rfl:   Social History   Tobacco Use  Smoking Status Never Smoker  Smokeless Tobacco Never Used    Allergies  Allergen Reactions  . Aspirin Other (See Comments)    On meloxicam  . Keflex [Cephalexin] Hives  . Losartan Rash   Objective:   Vitals:   07/22/19 0952  Temp: (!) 97.1 F (36.2 C)   There is no height or weight on file to calculate BMI. Constitutional Well developed. Well nourished.  Vascular Dorsalis pedis pulses palpable bilaterally. Posterior tibial pulses palpable bilaterally. Capillary refill normal to all digits.  No cyanosis or clubbing noted. Pedal hair growth normal.  Neurologic Normal speech. Oriented to person, place, and time. Protective sensation absent  Dermatologic  wound healed left fourth interspace no warmth no erythema no signs of acute infection  Orthopedic: No pain to palpation either foot.   Radiographs: None today Assessment:   1. Chronic ulcer of great toe of left foot, limited to breakdown of skin ([place])   2. Cellulitis and abscess of toe of left foot    Plan:  Patient was evaluated and treated and all questions answered.  Abscess Left 4th Interspace -Improved no open area no signs  of infection -Advised to monitor for signs of recurrence and return should they occur -Micro report reviewed with patient normal skin flora  No follow-ups on file.

## 2019-09-16 DIAGNOSIS — M7501 Adhesive capsulitis of right shoulder: Secondary | ICD-10-CM | POA: Insufficient documentation

## 2019-09-16 DIAGNOSIS — M5412 Radiculopathy, cervical region: Secondary | ICD-10-CM | POA: Insufficient documentation

## 2019-11-09 DIAGNOSIS — M5030 Other cervical disc degeneration, unspecified cervical region: Secondary | ICD-10-CM | POA: Insufficient documentation

## 2019-11-29 ENCOUNTER — Ambulatory Visit: Payer: Medicare HMO | Admitting: Sports Medicine

## 2019-11-29 ENCOUNTER — Other Ambulatory Visit: Payer: Self-pay

## 2019-11-29 ENCOUNTER — Encounter: Payer: Self-pay | Admitting: Sports Medicine

## 2019-11-29 DIAGNOSIS — M79672 Pain in left foot: Secondary | ICD-10-CM

## 2019-11-29 DIAGNOSIS — E1142 Type 2 diabetes mellitus with diabetic polyneuropathy: Secondary | ICD-10-CM

## 2019-11-29 DIAGNOSIS — B359 Dermatophytosis, unspecified: Secondary | ICD-10-CM

## 2019-11-29 DIAGNOSIS — E1165 Type 2 diabetes mellitus with hyperglycemia: Secondary | ICD-10-CM

## 2019-11-29 DIAGNOSIS — IMO0002 Reserved for concepts with insufficient information to code with codable children: Secondary | ICD-10-CM

## 2019-11-29 DIAGNOSIS — L988 Other specified disorders of the skin and subcutaneous tissue: Secondary | ICD-10-CM

## 2019-11-29 DIAGNOSIS — L03032 Cellulitis of left toe: Secondary | ICD-10-CM

## 2019-11-29 DIAGNOSIS — L02612 Cutaneous abscess of left foot: Secondary | ICD-10-CM

## 2019-11-29 MED ORDER — CLOTRIMAZOLE 1 % EX SOLN: 1.0000 "application " | Freq: Two times a day (BID) | CUTANEOUS | 5 refills | Status: DC

## 2019-11-29 MED ORDER — SULFAMETHOXAZOLE-TRIMETHOPRIM 400-80 MG PO TABS: 1.0000 | ORAL_TABLET | Freq: Two times a day (BID) | ORAL | 0 refills | Status: DC

## 2019-11-29 NOTE — Progress Notes (Signed)
Subjective: Katherine Pratt is a 68 y.o. female patient seen in office for evaluation of a sore itchy spot in between toes. Patient has been putting jock itch cream which helps with itchiness but is concern because she had cellulitis before on the left leg.  Denies nausea/fever/vomiting/chills/night sweats/shortness of breath/pain. Patient has no other pedal complaints at this time.  FBS not recorded today   Patient Active Problem List   Diagnosis Date Noted  . Degenerative cervical disc 11/09/2019  . Adhesive capsulitis of right shoulder 09/16/2019  . Right cervical radiculopathy 09/16/2019  . Degenerative spinal arthritis 04/28/2018  . Greater trochanteric bursitis of right hip 04/02/2017  . Morbid obesity ([place]) 07/24/2016  . Prepatellar bursitis, right knee 07/24/2016  . Primary osteoarthritis of right knee 07/24/2016  . Acute non-recurrent maxillary sinusitis 05/20/2016  . Uncontrolled type 2 diabetes mellitus with diabetic polyneuropathy, without long-term current use of insulin ([place]) 09/25/2015  . Fatty liver 08/01/2015  . Hypercholesterolemia 08/01/2015  . Malignant neoplasm of right female breast ([place]) 08/01/2015  . Benign essential hypertension 04/14/2014   Current Outpatient Medications on File Prior to Visit  Medication Sig Dispense Refill  . amLODipine (NORVASC) 10 MG tablet     . atorvastatin (LIPITOR) 40 MG tablet Take by mouth.    . diphenhydrAMINE (BENADRYL) 25 MG tablet Take 2 tablets (50 mg total) by mouth every 6 (six) hours as needed. 30 tablet 0  . EPINEPHrine 0.3 mg/0.3 mL IJ SOAJ injection 0.3 mg as needed.    . famotidine (PEPCID) 20 MG tablet Take 1 tablet (20 mg total) by mouth 2 (two) times daily. 30 tablet 0  . GLIPIZIDE ER PO Take by mouth.    . meclizine (ANTIVERT) 25 MG tablet Take 1 tablet (25 mg total) by mouth 3 (three) times daily as needed for dizziness. 30 tablet 0  . meloxicam (MOBIC) 15 MG tablet Take by mouth.    . metFORMIN (GLUCOPHAGE-XR) 500  MG 24 hr tablet     . pioglitazone (ACTOS) 30 MG tablet     . pregabalin (LYRICA) 25 MG capsule     . TRULICITY 1.5 0000000 SOPN      No current facility-administered medications on file prior to visit.   Allergies  Allergen Reactions  . Aspirin Other (See Comments)    On meloxicam  . Keflex [Cephalexin] Hives  . Losartan Rash    No results found for this or any previous visit (from the past 2160 hour(s)).  Objective: There were no vitals filed for this visit.  General: Patient is awake, alert, oriented x 3 and in no acute distress.  Dermatology: Skin is warm and dry bilateral with a macerated area in between 4-5 toes on the left foot, There is no malodor, no active drainage, minimal erythema, + edema. No acute signs of infection.   Vascular: Dorsalis Pedis pulse = 1/4 Bilateral,  Posterior Tibial pulse = 0/4 Bilateral due to trace edema,  Capillary Fill Time < 5 seconds  Neurologic: Protective sensation diminished bilateral.  Musculosketal: There is no pain with palpation to macerated area in between toes on left 4-5 toes. No pain with compression to calves bilateral.   No results for input(s): GRAMSTAIN, LABORGA in the last 8760 hours.  Assessment and Plan:  Problem List Items Addressed This Visit      Endocrine   Uncontrolled type 2 diabetes mellitus with diabetic polyneuropathy, without long-term current use of insulin ([facility])   Relevant Medications   pregabalin (LYRICA) 25 MG capsule  Other Visit Diagnoses    Tinea    -  Primary   Relevant Medications   clotrimazole (LOTRIMIN) 1 % external solution   sulfamethoxazole-trimethoprim (BACTRIM) 400-80 MG tablet   Skin maceration       Cellulitis and abscess of toe of left foot       Foot pain, left         -Examined patient and discussed the progression of the maceration in between the toes with itching  - Rx Clotrimazole solution to use in between toes -Applied betadine and dry sterile dressing. - Advised  patient to go to the ER or return to office if the toes worsens or if constitutional symptoms are present. -Rx Bactrim for history of cellulitis -Advised patient to refrain from using creams in between toes -Advised patient to take bendryl if needed for itching all over and on legs -Patient to return to office in 2 weeks with Dr. March Rummage for follow up care and evaluation or sooner if problems arise.  Landis Martins, DPM

## 2019-12-15 ENCOUNTER — Other Ambulatory Visit: Payer: Self-pay

## 2019-12-15 ENCOUNTER — Encounter: Payer: Self-pay | Admitting: Podiatry

## 2019-12-15 ENCOUNTER — Ambulatory Visit: Payer: Medicare HMO | Admitting: Podiatry

## 2019-12-15 DIAGNOSIS — M2012 Hallux valgus (acquired), left foot: Secondary | ICD-10-CM

## 2019-12-15 DIAGNOSIS — L988 Other specified disorders of the skin and subcutaneous tissue: Secondary | ICD-10-CM | POA: Diagnosis not present

## 2019-12-15 DIAGNOSIS — M21612 Bunion of left foot: Secondary | ICD-10-CM

## 2019-12-15 DIAGNOSIS — B359 Dermatophytosis, unspecified: Secondary | ICD-10-CM

## 2019-12-15 DIAGNOSIS — M2042 Other hammer toe(s) (acquired), left foot: Secondary | ICD-10-CM

## 2019-12-15 NOTE — Progress Notes (Signed)
  Subjective:  Patient ID: Katherine Pratt, female    DOB: 1950/12/15,  MRN: [id number]  Chief Complaint  Patient presents with  . Foot Ulcer    pt is here for a f/u of toe ulcer to the left toe, pt also states that the left toe shows no signs of infection, pt has been taking all meds prescribed, pt also states that she has been applying creams between toes as well.    69 y.o. female presents with the above complaint. History confirmed with patient. Also wondering about worsening bunion left foot and whether this will need to get fixed.  Objective:  Physical Exam: warm, good capillary refill, no trophic changes or ulcerative lesions, normal DP and PT pulses, and normal sensory exam. Left Foot: bunion deformity noted and no maceration in the interspace ; hammertoe deformities Right Foot: defer   No images are attached to the encounter.  Assessment:  1. Skin maceration   2. Tinea   3. Hallux valgus with bunions of left foot   4. Hammer toe of left foot      Plan:  Patient was evaluated and treated and all questions answered.  Tinea pedis, interdigital maceration -Interspace infection cleared. Discussed hygiene and prevention of recurrence. Discussed use of drying agents in the interspace.   Bunion left, hammertoes -Discussed that I do not think surgical intervention is indicated at this time. Discussed should this worsen we could consider surgical intervention but I would hold off until the pain is a daily occurrence or preventing wearing of shoegear or ambulating without pain.  Return if symptoms worsen or fail to improve.   MDM Number of Diagnoses or Management Options Hallux valgus with bunions of left foot: new, needed workup Hammer toe of left foot: new, needed workup Skin maceration: established, improving Tinea: established, improving Risk of Complications, Morbidity, and/or Mortality Presenting problems: moderate Management options: moderate

## 2020-01-15 IMAGING — CT CT HEAD W/O CM
3 series · 15 of 47 positions shown, 18 images · non-contrast
Comparison: Head CT 10/09/2014

CLINICAL DATA: Dizziness and blurry vision

EXAM:
CT HEAD WITHOUT CONTRAST
TECHNIQUE: Contiguous axial images were obtained from the base of the skull
through the vertex without intravenous contrast.

[Series 3: head wo · axial · 0.44mm/px · z∈[+50,+176]mm · 9 of 31 slices shown, 12 images]
[im 3/31  brain]
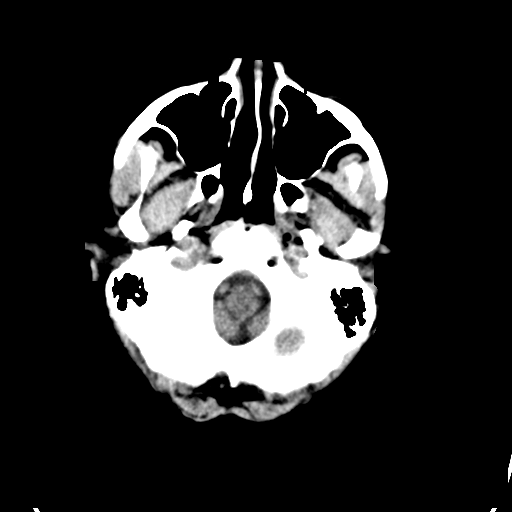
[im 3/31  bone]
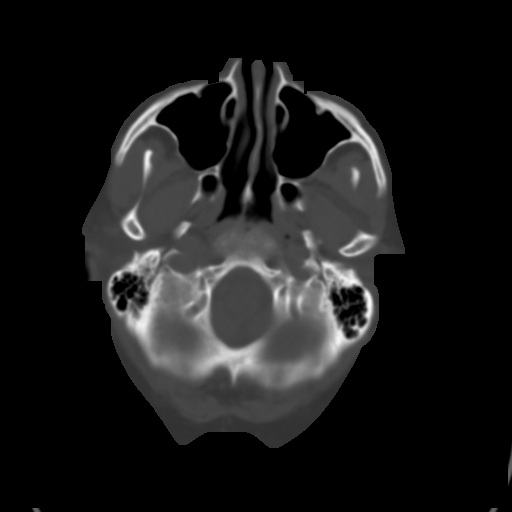
[im 6/31  brain]
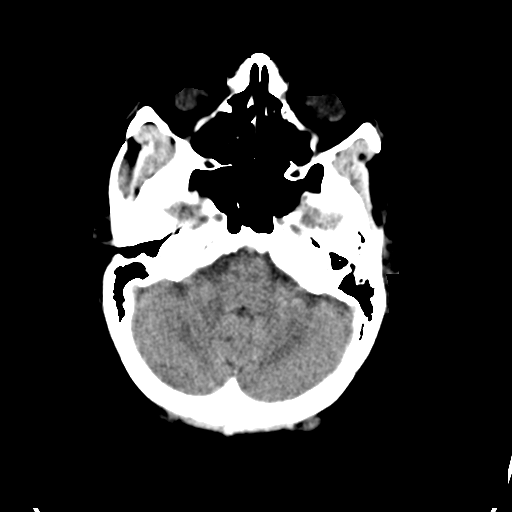
[im 9/31  brain]
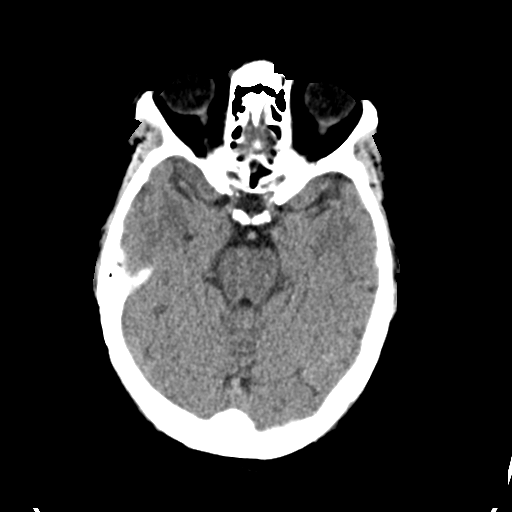
[im 12/31  brain]
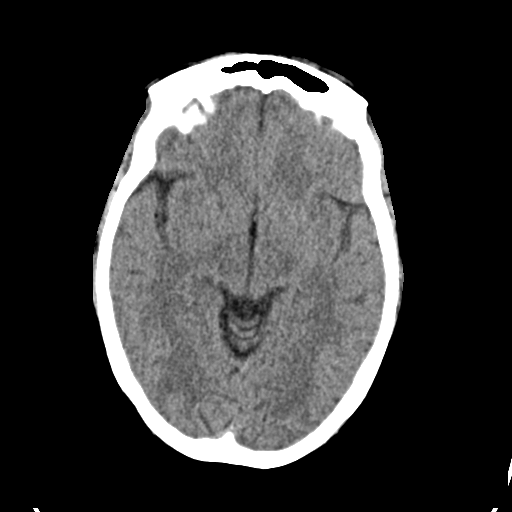
[im 16/31  brain]
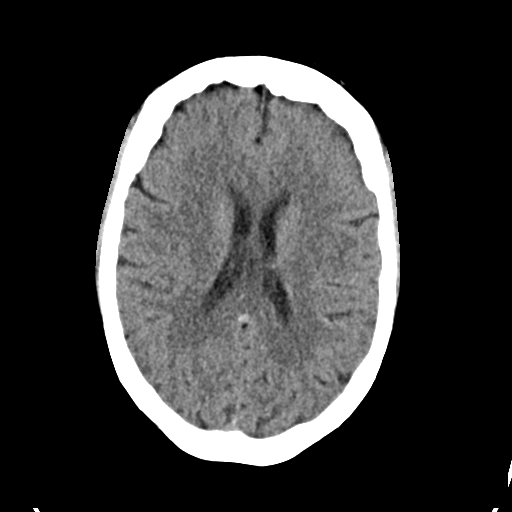
[im 16/31  bone]
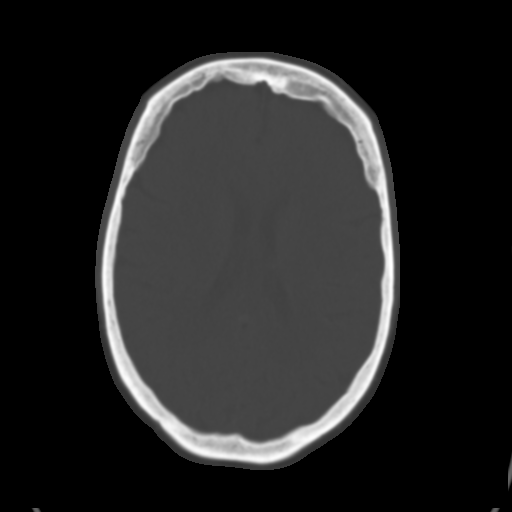
[im 19/31  brain]
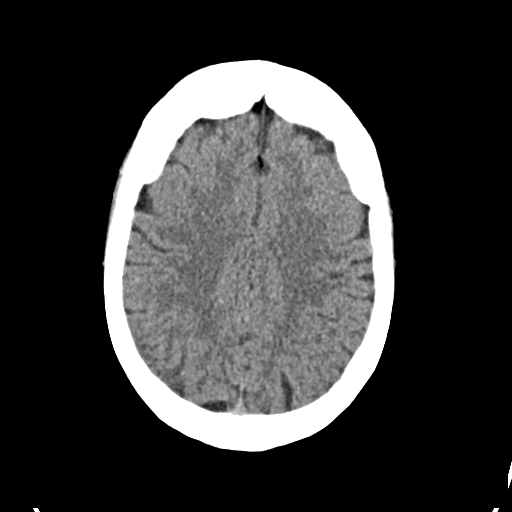
[im 22/31  brain]
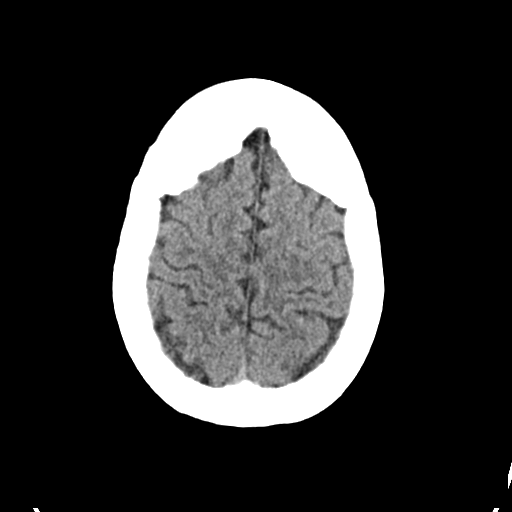
[im 25/31  brain]
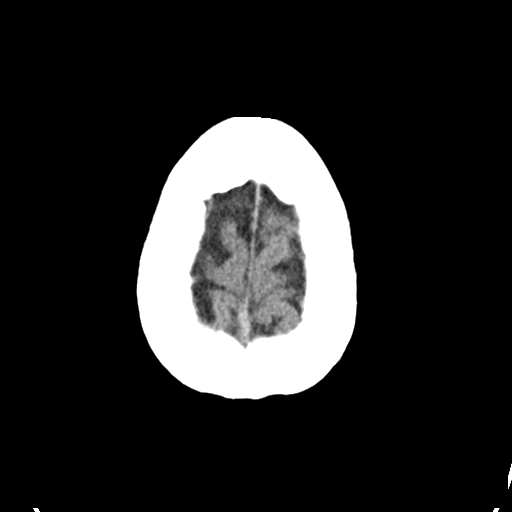
[im 28/31  brain]
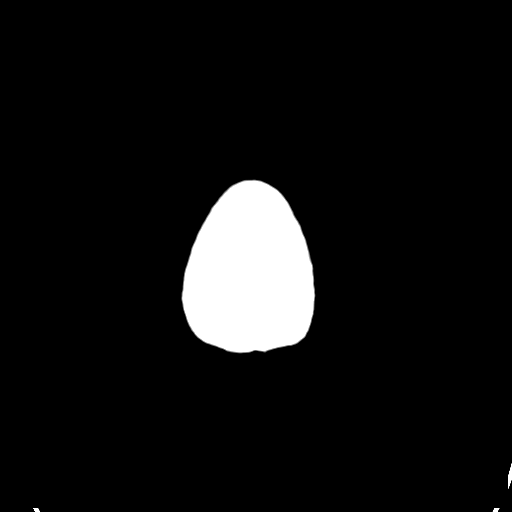
[im 28/31  bone]
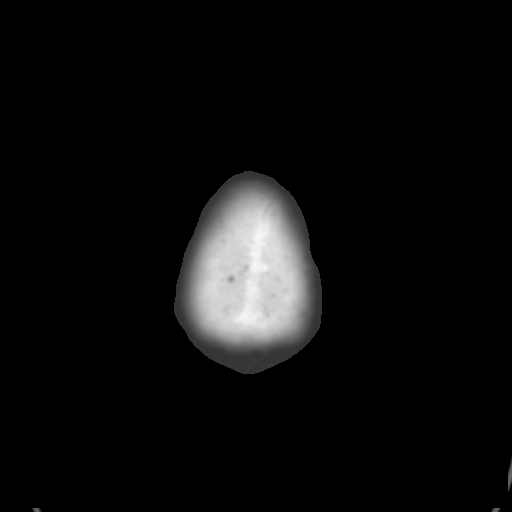

[Series 5: coronal soft · coronal · 0.30mm/px · 3 of 66 slices shown]
[im 22/66  brain]
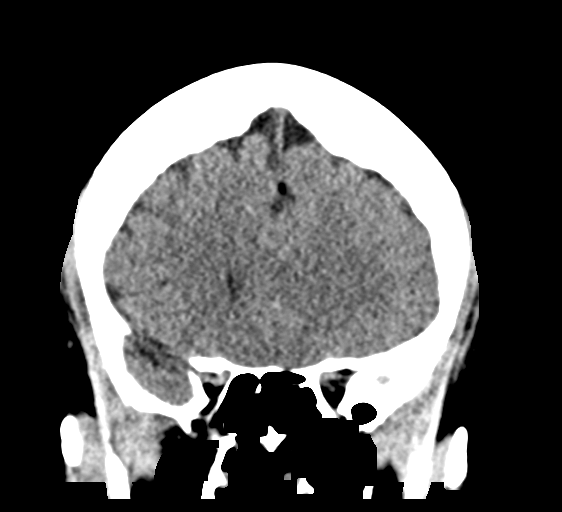
[im 29/66  brain]
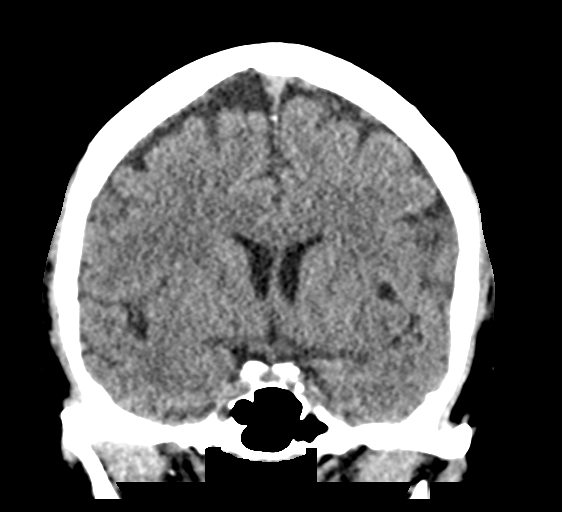
[im 37/66  brain]
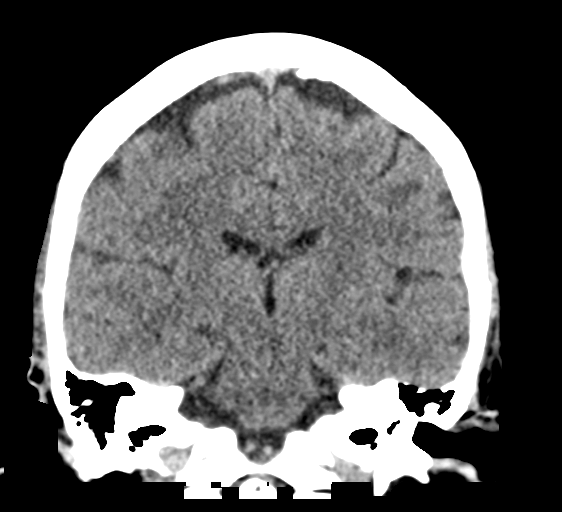

[Series 6: sag soft · sagittal · 0.29mm/px · 3 of 53 slices shown]
[im 18/53  brain]
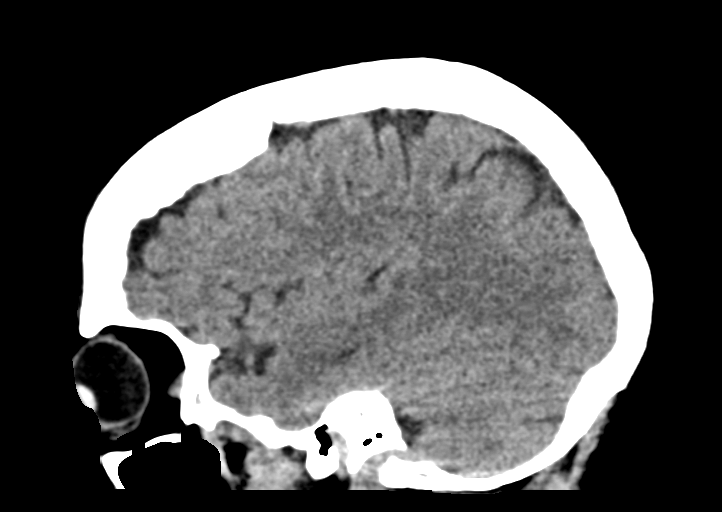
[im 27/53  brain]
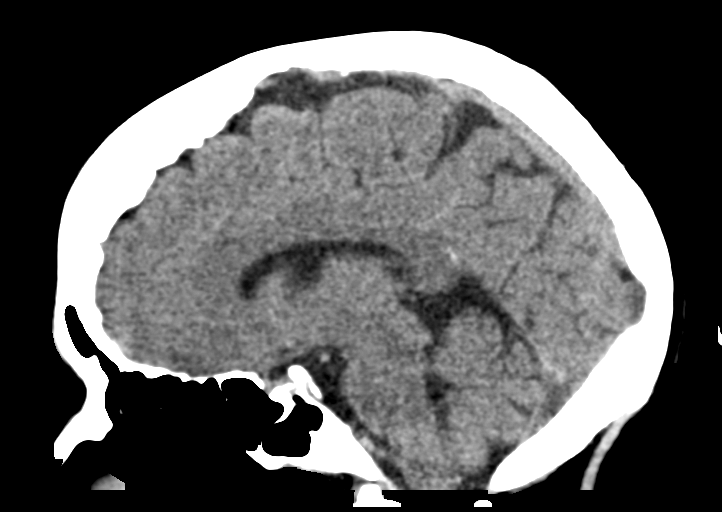
[im 35/53  brain]
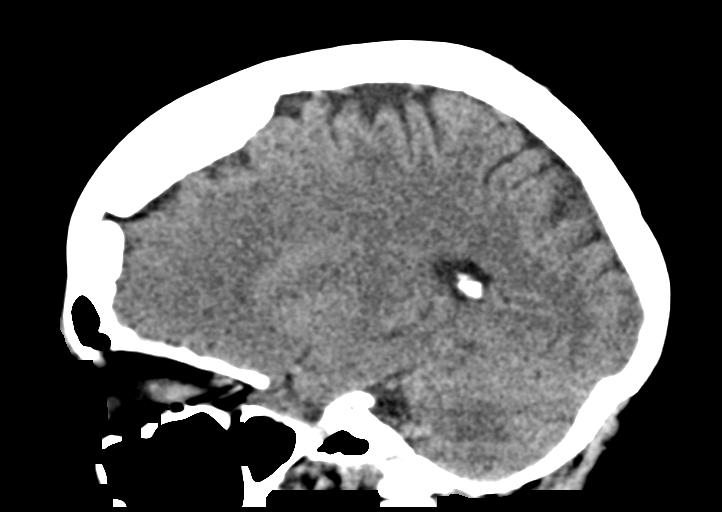

[15 of 47 positions shown; findings below may reference images not displayed]

FINDINGS: Brain: No mass lesion, intraparenchymal hemorrhage or extra-axial
collection. No evidence of acute cortical infarct. Brain parenchyma
and CSF-containing spaces are normal for age.

Vascular: No hyperdense vessel or unexpected calcification.

Skull: Normal visualized skull base, calvarium and extracranial soft
tissues.

Sinuses/Orbits: No sinus fluid levels or advanced mucosal
thickening. No mastoid effusion. Normal orbits.
IMPRESSION: Normal head CT.

## 2021-07-18 ENCOUNTER — Emergency Department (HOSPITAL_BASED_OUTPATIENT_CLINIC_OR_DEPARTMENT_OTHER): Payer: Medicare HMO

## 2021-07-18 ENCOUNTER — Other Ambulatory Visit: Payer: Self-pay

## 2021-07-18 ENCOUNTER — Emergency Department (HOSPITAL_BASED_OUTPATIENT_CLINIC_OR_DEPARTMENT_OTHER)
Admission: EM | Admit: 2021-07-18 | Discharge: 2021-07-19 | Disposition: A | Discharge status: Home / Self Care | Payer: Medicare HMO | Attending: Emergency Medicine | Admitting: Emergency Medicine

## 2021-07-18 ENCOUNTER — Encounter (HOSPITAL_BASED_OUTPATIENT_CLINIC_OR_DEPARTMENT_OTHER): Payer: Self-pay | Admitting: *Deleted

## 2021-07-18 DIAGNOSIS — Z7984 Long term (current) use of oral hypoglycemic drugs: Secondary | ICD-10-CM | POA: Diagnosis not present

## 2021-07-18 DIAGNOSIS — E1142 Type 2 diabetes mellitus with diabetic polyneuropathy: Secondary | ICD-10-CM | POA: Insufficient documentation

## 2021-07-18 DIAGNOSIS — I10 Essential (primary) hypertension: Secondary | ICD-10-CM | POA: Diagnosis not present

## 2021-07-18 DIAGNOSIS — Z853 Personal history of malignant neoplasm of breast: Secondary | ICD-10-CM | POA: Insufficient documentation

## 2021-07-18 DIAGNOSIS — H4921 Sixth [abducent] nerve palsy, right eye: Secondary | ICD-10-CM | POA: Insufficient documentation

## 2021-07-18 DIAGNOSIS — Z79899 Other long term (current) drug therapy: Secondary | ICD-10-CM | POA: Insufficient documentation

## 2021-07-18 DIAGNOSIS — H532 Diplopia: Secondary | ICD-10-CM

## 2021-07-18 DIAGNOSIS — R519 Headache, unspecified: Secondary | ICD-10-CM | POA: Diagnosis present

## 2021-07-18 LAB — BASIC METABOLIC PANEL
Anion gap: 8 (ref 5–15)
BUN: 13 mg/dL (ref 8–23)
CO2: 26 mmol/L (ref 22–32)
Calcium: 8.6 mg/dL — ABNORMAL LOW (ref 8.9–10.3)
Chloride: 104 mmol/L (ref 98–111)
Creatinine, Ser: 0.78 mg/dL (ref 0.44–1.00)
GFR, Estimated: >60 mL/min (ref >60)
Glucose, Bld: 136 mg/dL — ABNORMAL HIGH (ref 70–99)
Potassium: 3.7 mmol/L (ref 3.5–5.1)
Sodium: 138 mmol/L (ref 135–145)

## 2021-07-18 LAB — CBC WITH DIFFERENTIAL/PLATELET
Abs Immature Granulocytes: 0.01 10*3/uL (ref 0.00–0.07)
Basophils Absolute: 0 10*3/uL (ref 0.0–0.1)
Basophils Relative: 0 %
Eosinophils Absolute: 0.1 10*3/uL (ref 0.0–0.5)
Eosinophils Relative: 2 %
HCT: 41.5 % (ref 36.0–46.0)
Hemoglobin: 14.1 g/dL (ref 12.0–15.0)
Immature Granulocytes: 0 %
Lymphocytes Relative: 39 %
Lymphs Abs: 2.5 10*3/uL (ref 0.7–4.0)
MCH: 31.7 pg (ref 26.0–34.0)
MCHC: 34 g/dL (ref 30.0–36.0)
MCV: 93.3 fL (ref 80.0–100.0)
Monocytes Absolute: 0.5 10*3/uL (ref 0.1–1.0)
Monocytes Relative: 7 %
Neutro Abs: 3.4 10*3/uL (ref 1.7–7.7)
Neutrophils Relative %: 52 %
Platelets: 300 10*3/uL (ref 150–400)
RBC: 4.45 MIL/uL (ref 3.87–5.11)
RDW: 12.9 % (ref 11.5–15.5)
WBC: 6.6 10*3/uL (ref 4.0–10.5)
nRBC: 0 % (ref 0.0–0.2)

## 2021-07-18 MED ORDER — TETRACAINE HCL 0.5 % OP SOLN
1.0000 [drp] | Freq: Once | OPHTHALMIC | Status: AC
  Administered 2021-07-18: 1 [drp] via OPHTHALMIC
  Filled 2021-07-18: qty 4

## 2021-07-18 MED ORDER — OXYCODONE HCL 5 MG PO TABS
5.0000 mg | ORAL_TABLET | Freq: Once | ORAL | Status: AC
  Administered 2021-07-18: 5 mg via ORAL
  Filled 2021-07-18: qty 1

## 2021-07-18 NOTE — ED Provider Notes (Signed)
[facility]PART[facility] Provider Note   CSN: [id number] Arrival date & time: 07/18/21  1803     History Chief Complaint  Patient presents with   Headache    Double vision    Katherine Pratt is a 70 y.o. female.  The history is provided by the patient.  Headache Pain location:  R parietal Quality:  Dull Radiates to:  Does not radiate Onset quality:  Gradual Duration:  4 days Timing:  Intermittent Progression:  Waxing and waning Chronicity:  New Relieved by:  Nothing Worsened by:  Nothing Associated symptoms: blurred vision (double vision in the right eye when looking to the right)   Associated symptoms: no abdominal pain, no back pain, no congestion, no cough, no diarrhea, no dizziness, no drainage, no ear pain, no eye pain, no facial pain, no fatigue, no fever, no focal weakness, no hearing loss, no loss of balance, no myalgias, no nausea, no near-syncope, no neck pain, no neck stiffness, no numbness, no paresthesias, no photophobia, no seizures, no sinus pressure, no sore throat, no swollen glands, no syncope, no tingling, no URI, no visual change, no vomiting and no weakness   Risk factors comment:  DM     Past Medical History:  Diagnosis Date   Breast cancer ([place])    Diabetes mellitus without complication ([place])    Hypertension     Patient Active Problem List   Diagnosis Date Noted   Degenerative cervical disc 11/09/2019   Adhesive capsulitis of right shoulder 09/16/2019   Right cervical radiculopathy 09/16/2019   Degenerative spinal arthritis 04/28/2018   Greater trochanteric bursitis of right hip 04/02/2017   Morbid obesity ([place]) 07/24/2016   Prepatellar bursitis, right knee 07/24/2016   Primary osteoarthritis of right knee 07/24/2016   Acute non-recurrent maxillary sinusitis 05/20/2016   Uncontrolled type 2 diabetes mellitus with diabetic polyneuropathy, without long-term current use of insulin ([place]) 09/25/2015   Fatty liver 08/01/2015    Hypercholesterolemia 08/01/2015   Malignant neoplasm of right female breast ([place]) 08/01/2015   Benign essential hypertension 04/14/2014    Past Surgical History:  Procedure Laterality Date   ABDOMINAL HYSTERECTOMY     APPENDECTOMY     BREAST LUMPECTOMY     ELBOW SURGERY     HERNIA REPAIR     KNEE SURGERY       OB History   No obstetric history on file.     No family history on file.  Social History   Tobacco Use   Smoking status: Never   Smokeless tobacco: Never  Vaping Use   Vaping Use: Never used  Substance Use Topics   Alcohol use: No   Drug use: No    Home Medications Prior to Admission medications   Medication Sig Start Date End Date Taking? Authorizing Provider  ACCU-CHEK GUIDE test strip  12/10/19   [provider]  amLODipine (NORVASC) 10 MG tablet  07/09/19   [provider]  atorvastatin (LIPITOR) 40 MG tablet Take by mouth. 07/15/19   [provider]  clotrimazole (LOTRIMIN) 1 % external solution Apply 1 application topically 2 (two) times daily. In between toes 11/29/19   Landis Martins, DPM  diphenhydrAMINE (BENADRYL) 25 MG tablet Take 2 tablets (50 mg total) by mouth every 6 (six) hours as needed. 07/02/19   Drenda Freeze, MD  EPINEPHrine 0.3 mg/0.3 mL IJ SOAJ injection 0.3 mg as needed. 01/04/15   [provider]  famotidine (PEPCID) 20 MG tablet Take 1 tablet (20 mg  total) by mouth 2 (two) times daily. 07/19/14   Palumbo, April, MD  GLIPIZIDE ER PO Take by mouth.    [provider]  meclizine (ANTIVERT) 25 MG tablet Take 1 tablet (25 mg total) by mouth 3 (three) times daily as needed for dizziness. 12/29/17   Molpus, John, MD  meloxicam (MOBIC) 15 MG tablet Take by mouth. 11/09/19   [provider]  metFORMIN (GLUCOPHAGE-XR) 500 MG 24 hr tablet  07/09/19   [provider]  pioglitazone (ACTOS) 30 MG tablet  07/09/19   [provider]  pregabalin (LYRICA) 25 MG capsule  11/09/19   [provider]  sulfamethoxazole-trimethoprim (BACTRIM) 400-80 MG tablet Take 1 tablet by mouth 2 (two) times daily. 11/29/19   Landis Martins, DPM  TRULICITY 1.5 0000000 [facility]  07/08/19   [provider]    Allergies    Aspirin, Keflex [cephalexin], and Losartan  Review of Systems   Review of Systems  Constitutional:  Negative for chills, fatigue and fever.  HENT:  Negative for congestion, ear pain, hearing loss, postnasal drip, sinus pressure and sore throat.   Eyes:  Positive for blurred vision (double vision in the right eye when looking to the right) and visual disturbance. Negative for photophobia, pain, discharge, redness and itching.  Respiratory:  Negative for cough and shortness of breath.   Cardiovascular:  Negative for chest pain, palpitations, syncope and near-syncope.  Gastrointestinal:  Negative for abdominal pain, diarrhea, nausea and vomiting.  Genitourinary:  Negative for dysuria and hematuria.  Musculoskeletal:  Negative for arthralgias, back pain, myalgias, neck pain and neck stiffness.  Skin:  Negative for color change and rash.  Neurological:  Positive for headaches. Negative for dizziness, tremors, focal weakness, seizures, syncope, facial asymmetry, speech difficulty, weakness, light-headedness, numbness, paresthesias and loss of balance.  All other systems reviewed and are negative.  Physical Exam Updated Vital Signs BP (!) 161/108 (BP Location: Left Arm)   Pulse (!) 59   Temp 98.3 F (36.8 C) (Oral)   Resp 18   Ht '5\' 6"'$  (1.676 m)   Wt 112.9 kg   SpO2 100%   BMI 40.19 kg/m   Physical Exam Vitals and nursing note reviewed.  Constitutional:      General: She is not in acute distress.    Appearance: She is well-developed. She is not ill-appearing.  HENT:     Head: Normocephalic and atraumatic.     Mouth/Throat:     Mouth: Mucous membranes are moist.     Pharynx: Oropharynx is clear.  Eyes:     Extraocular Movements:     Right eye: Normal  extraocular motion.     Left eye: Normal extraocular motion.     Conjunctiva/sclera: Conjunctivae normal.     Pupils: Pupils are equal, round, and reactive to light.     Comments: Has a subtle right lateral gaze palsy in the right eye, states that she has double vision in the right eye when in lateral gaze but otherwise does not have any double vision, no double vision in the left eye.  When I cover the right eye double vision goes away, tonometry is 22 in the right eye  Cardiovascular:     Rate and Rhythm: Normal rate and regular rhythm.     Heart sounds: Normal heart sounds. No murmur heard. Pulmonary:     Effort: Pulmonary effort is normal. No respiratory distress.     Breath sounds: Normal breath sounds.  Abdominal:  Palpations: Abdomen is soft.     Tenderness: There is no abdominal tenderness.  Musculoskeletal:        General: Normal range of motion.     Cervical back: Normal range of motion and neck supple.  Skin:    General: Skin is warm and dry.  Neurological:     Mental Status: She is alert and oriented to person, place, and time.     Cranial Nerves: No cranial nerve deficit, dysarthria or facial asymmetry.     Sensory: No sensory deficit.     Motor: No weakness.     Comments: 5+ out of 5 strength throughout, normal sensation, no drift, normal finger-to-nose finger  Psychiatric:        Mood and Affect: Mood normal.    ED Results / Procedures / Treatments   Labs (all labs ordered are listed, but only abnormal results are displayed) Labs Reviewed  BASIC METABOLIC PANEL - Abnormal; Notable for the following components:      Result Value   Glucose, Bld 136 (*)    Calcium 8.6 (*)    All other components within normal limits  CBC WITH DIFFERENTIAL/PLATELET    EKG None  Radiology CT HEAD WO CONTRAST (5MM)  Result Date: 07/18/2021 CLINICAL DATA:  Acute neurologic deficit, stroke suspected. Double vision right eye for 4 days EXAM: CT HEAD WITHOUT CONTRAST TECHNIQUE:  Contiguous axial images were obtained from the base of the skull through the vertex without intravenous contrast. COMPARISON:  CT 10/09/2014 FINDINGS: Brain: No evidence of acute infarction, hemorrhage, hydrocephalus, extra-axial collection, visible mass lesion or mass effect. No significant age advanced parenchymal volume loss. Vascular: Atherosclerotic calcification of the carotid siphons. No hyperdense vessel. Skull: Hyperostosis frontalis interna, a benign incidental. No calvarial fracture or suspicious osseous lesion. No scalp swelling or hematoma. Sinuses/Orbits: Paranasal sinuses and mastoid air cells are predominantly clear. Included orbital structures are unremarkable. Other: None. IMPRESSION: No acute intracranial abnormality. Electronically Signed   By: Lovena Le M.D.   On: 07/18/2021 19:24    Procedures Procedures   Medications Ordered in ED Medications  tetracaine (PONTOCAINE) 0.5 % ophthalmic solution 1 drop (1 drop Right Eye Given 07/18/21 2151)  oxyCODONE (Oxy IR/ROXICODONE) immediate release tablet 5 mg (5 mg Oral Given 07/18/21 2248)    ED Course  I have reviewed the triage vital signs and the nursing notes.  Pertinent labs & imaging results that were available during my care of the patient were reviewed by me and considered in my medical decision making (see chart for details).    MDM Rules/Calculators/A&P                           Katherine Pratt is here with double vision, headache.  Overall unremarkable vitals.  No fever.  Has had a mild headache on and off for the last several days.  Has noticed double vision to the right eye during this time as well.  Overall eye pressure is normal.  Normal visual acuity.  Normal neurological exam.  CT scan of head unremarkable.  Lab work overall unremarkable.  Patient has double vision only in the right eye.  She states that when she looks to the right that is when she notices double vision.  When I cover her left eye and she looks to the  right she sees double vision but when she looks forward she no longer sees double vision.  When I cover the right eye she  has no double vision.  Overall she has monocular diplopia and concern overall for intrinsic eye issue.  Suspect that this is likely diabetic complication.  Low suspicion for stroke given this exam finding which I discussed on the phone with neurology who also agreed.  Talked on the phone with Dr. Posey Pronto with ophthalmology who will follow up with the patient tomorrow morning.  Discharged in ED in good condition.  Suspect that this is a infarct from diabetic complication, no concern for acute angle glaucoma.  Low suspicion for stroke.  To see ophthalmology tomorrow.  Discharged in good condition.  This chart was dictated using voice recognition software.  Despite best efforts to proofread,  errors can occur which can change the documentation meaning.   Final Clinical Impression(s) / ED Diagnoses Final diagnoses:  Double vision  Right abducens nerve palsy    Rx / DC Orders ED Discharge Orders     None        Lennice Sites, DO 07/19/21 0006

## 2021-07-18 NOTE — ED Triage Notes (Signed)
C/o h/a and double vision to right eye x 4 days

## 2021-07-18 NOTE — Plan of Care (Signed)
Patient briefly discussed with Dr. Ronnald Nian.  He describes monocular double vision in the right eye only when the patient looks towards the right (still present if the left eye is covered), her examination is otherwise unremarkable.  We discussed that localization for such a finding has to be peripheral, within the globe and would warrant ophthalmology evaluation.  Given that the patient has been stable for 4 days and did try to seek ophthalmology evaluation but was told that she could not be seen until November, he will attempt to arrange closer ophthalmology evaluation for her within the next day  Lesleigh Noe MD-PhD [facility] [phone]  Available 7 PM to 7 AM, outside of these hours please call Neurologist on call as listed on Amion.

## 2021-07-18 NOTE — ED Notes (Signed)
Verbal order from PA HArris ct head

## 2021-07-18 NOTE — Discharge Instructions (Addendum)
Follow-up with Dr. Posey Pronto, ophthalmologist.  Please call tomorrow morning to make an appointment.

## 2021-12-08 DIAGNOSIS — I6529 Occlusion and stenosis of unspecified carotid artery: Secondary | ICD-10-CM

## 2021-12-08 HISTORY — DX: Occlusion and stenosis of unspecified carotid artery: I65.29

## 2021-12-09 ENCOUNTER — Inpatient Hospital Stay (HOSPITAL_BASED_OUTPATIENT_CLINIC_OR_DEPARTMENT_OTHER)
Admission: EM | Admit: 2021-12-09 | Discharge: 2021-12-20 | DRG: 234 | Disposition: A | Discharge status: Home / Self Care | Payer: Medicare HMO | Attending: Surgery | Admitting: Surgery

## 2021-12-09 ENCOUNTER — Other Ambulatory Visit: Payer: Self-pay

## 2021-12-09 ENCOUNTER — Emergency Department (HOSPITAL_BASED_OUTPATIENT_CLINIC_OR_DEPARTMENT_OTHER): Payer: Medicare HMO

## 2021-12-09 ENCOUNTER — Encounter (HOSPITAL_BASED_OUTPATIENT_CLINIC_OR_DEPARTMENT_OTHER): Payer: Self-pay

## 2021-12-09 DIAGNOSIS — Z6841 Body Mass Index (BMI) 40.0 and over, adult: Secondary | ICD-10-CM

## 2021-12-09 DIAGNOSIS — I25110 Atherosclerotic heart disease of native coronary artery with unstable angina pectoris: Secondary | ICD-10-CM | POA: Diagnosis present

## 2021-12-09 DIAGNOSIS — Z20822 Contact with and (suspected) exposure to covid-19: Secondary | ICD-10-CM | POA: Diagnosis present

## 2021-12-09 DIAGNOSIS — J939 Pneumothorax, unspecified: Secondary | ICD-10-CM

## 2021-12-09 DIAGNOSIS — I10 Essential (primary) hypertension: Secondary | ICD-10-CM

## 2021-12-09 DIAGNOSIS — Z853 Personal history of malignant neoplasm of breast: Secondary | ICD-10-CM

## 2021-12-09 DIAGNOSIS — I214 Non-ST elevation (NSTEMI) myocardial infarction: Secondary | ICD-10-CM | POA: Diagnosis not present

## 2021-12-09 DIAGNOSIS — E119 Type 2 diabetes mellitus without complications: Secondary | ICD-10-CM | POA: Diagnosis present

## 2021-12-09 DIAGNOSIS — Z923 Personal history of irradiation: Secondary | ICD-10-CM

## 2021-12-09 DIAGNOSIS — I440 Atrioventricular block, first degree: Secondary | ICD-10-CM | POA: Diagnosis present

## 2021-12-09 DIAGNOSIS — Z951 Presence of aortocoronary bypass graft: Secondary | ICD-10-CM

## 2021-12-09 DIAGNOSIS — Z833 Family history of diabetes mellitus: Secondary | ICD-10-CM

## 2021-12-09 DIAGNOSIS — D62 Acute posthemorrhagic anemia: Secondary | ICD-10-CM | POA: Diagnosis not present

## 2021-12-09 DIAGNOSIS — E785 Hyperlipidemia, unspecified: Secondary | ICD-10-CM | POA: Diagnosis present

## 2021-12-09 DIAGNOSIS — E7800 Pure hypercholesterolemia, unspecified: Secondary | ICD-10-CM | POA: Diagnosis not present

## 2021-12-09 DIAGNOSIS — Z8249 Family history of ischemic heart disease and other diseases of the circulatory system: Secondary | ICD-10-CM

## 2021-12-09 DIAGNOSIS — Z8261 Family history of arthritis: Secondary | ICD-10-CM

## 2021-12-09 DIAGNOSIS — Z7985 Long-term (current) use of injectable non-insulin antidiabetic drugs: Secondary | ICD-10-CM

## 2021-12-09 DIAGNOSIS — Z79899 Other long term (current) drug therapy: Secondary | ICD-10-CM

## 2021-12-09 LAB — BASIC METABOLIC PANEL
Anion gap: 10 (ref 5–15)
BUN: 13 mg/dL (ref 8–23)
CO2: 24 mmol/L (ref 22–32)
Calcium: 8.8 mg/dL — ABNORMAL LOW (ref 8.9–10.3)
Chloride: 104 mmol/L (ref 98–111)
Creatinine, Ser: 0.98 mg/dL (ref 0.44–1.00)
GFR, Estimated: >60 mL/min (ref >60)
Glucose, Bld: 130 mg/dL — ABNORMAL HIGH (ref 70–99)
Potassium: 3.5 mmol/L (ref 3.5–5.1)
Sodium: 138 mmol/L (ref 135–145)

## 2021-12-09 LAB — HEPATIC FUNCTION PANEL
ALT: 57 U/L — ABNORMAL HIGH (ref 0–44)
AST: 43 U/L — ABNORMAL HIGH (ref 15–41)
Albumin: 3.5 g/dL (ref 3.5–5.0)
Alkaline Phosphatase: 114 U/L (ref 38–126)
Bilirubin, Direct: <0.1 mg/dL (ref 0.0–0.2)
Total Bilirubin: 0.4 mg/dL (ref 0.3–1.2)
Total Protein: 7.1 g/dL (ref 6.5–8.1)

## 2021-12-09 LAB — RESP PANEL BY RT-PCR (FLU A&B, COVID) ARPGX2
Influenza A by PCR: NEGATIVE
Influenza B by PCR: NEGATIVE
SARS Coronavirus 2 by RT PCR: NEGATIVE

## 2021-12-09 LAB — TROPONIN I (HIGH SENSITIVITY)
Troponin I (High Sensitivity): 288 ng/L (ref <18)
Troponin I (High Sensitivity): 285 ng/L (ref <18)

## 2021-12-09 LAB — CBC
HCT: 41.3 % (ref 36.0–46.0)
Hemoglobin: 14.4 g/dL (ref 12.0–15.0)
MCH: 32 pg (ref 26.0–34.0)
MCHC: 34.9 g/dL (ref 30.0–36.0)
MCV: 91.8 fL (ref 80.0–100.0)
Platelets: 283 10*3/uL (ref 150–400)
RBC: 4.5 MIL/uL (ref 3.87–5.11)
RDW: 12.6 % (ref 11.5–15.5)
WBC: 5 10*3/uL (ref 4.0–10.5)
nRBC: 0 % (ref 0.0–0.2)

## 2021-12-09 LAB — LIPASE, BLOOD: Lipase: 29 U/L (ref 11–51)

## 2021-12-09 LAB — D-DIMER, QUANTITATIVE: D-Dimer, Quant: 0.63 ug/mL-FEU — ABNORMAL HIGH (ref 0.00–0.50)

## 2021-12-09 LAB — BRAIN NATRIURETIC PEPTIDE: B Natriuretic Peptide: 123.5 pg/mL — ABNORMAL HIGH (ref 0.0–100.0)

## 2021-12-09 MED ORDER — IOHEXOL 350 MG/ML SOLN
100.0000 mL | Freq: Once | INTRAVENOUS | Status: AC | PRN
  Administered 2021-12-09: 100 mL via INTRAVENOUS

## 2021-12-09 MED ORDER — ASPIRIN 81 MG PO CHEW
243.0000 mg | CHEWABLE_TABLET | Freq: Once | ORAL | Status: AC
  Administered 2021-12-09: 243 mg via ORAL
  Filled 2021-12-09: qty 3

## 2021-12-09 MED ORDER — HEPARIN (PORCINE) 25000 UT/250ML-% IV SOLN
1300.0000 [IU]/h | INTRAVENOUS | Status: DC
  Administered 2021-12-09 – 2021-12-10 (×2): 1100 [IU]/h via INTRAVENOUS
  Filled 2021-12-09 (×2): qty 250

## 2021-12-09 MED ORDER — HEPARIN BOLUS VIA INFUSION
4000.0000 [IU] | Freq: Once | INTRAVENOUS | Status: AC
  Administered 2021-12-09: 4000 [IU] via INTRAVENOUS

## 2021-12-09 NOTE — ED Provider Notes (Signed)
Emergency Department Provider Note   I have reviewed the triage vital signs and the nursing notes.   HISTORY  Chief Complaint Chest Pain   HPI Katherine Pratt is a 71 y.o. female with past medical history reviewed including high cholesterol, diabetes, elevated BMI, and hypertension presents with sharp intermittent chest pain starting at midnight.  Patient describes a stabbing-like pain "in my heart, not my chest."  No clear modifying factors.  She was up in severe pain for most of the night on and off.  Symptoms do seem improved with sitting up.  Denies any pleuritic pain or shortness of breath.  No fevers or upper respiratory infection symptoms.  No pain into the abdomen.  Today, she rested for most of the day with mostly improved symptoms but walked out to take her trash cans to the curb when she experienced "heaviness" in the chest which concerned her. She spoke with family/friends and ultimately presented to the ED. No prior history of ACS.     Review of Systems  Constitutional: No fever/chills Eyes: No visual changes. ENT: No sore throat. Cardiovascular: Positive chest pain. Respiratory: Denies shortness of breath. Gastrointestinal: No abdominal pain.  No nausea, no vomiting.  No diarrhea.  No constipation. Genitourinary: Negative for dysuria. Musculoskeletal: Negative for back pain. Skin: Negative for rash. Neurological: Negative for headaches, focal weakness or numbness.  10-point ROS otherwise negative.  ____________________________________________   PHYSICAL EXAM:  VITAL SIGNS: ED Triage Vitals  Enc Vitals Group     BP 12/09/21 1902 (!) 169/82     Pulse Rate 12/09/21 1902 72     Resp 12/09/21 1902 20     Temp 12/09/21 1902 98.6 F (37 C)     Temp Source 12/09/21 1902 Oral     SpO2 12/09/21 1902 96 %     Weight 12/09/21 1907 245 lb (111.1 kg)     Height 12/09/21 1907 5\' 5"  (1.651 m)    Constitutional: Alert and oriented. Well appearing and in no acute  distress. Eyes: Conjunctivae are normal. Head: Atraumatic. Nose: No congestion/rhinnorhea. Mouth/Throat: Mucous membranes are moist.   Neck: No stridor.   Cardiovascular: Normal rate, regular rhythm. Good peripheral circulation. Grossly normal heart sounds.   Respiratory: Normal respiratory effort.  No retractions. Lungs CTAB. Gastrointestinal: Soft and nontender. No distention.  Musculoskeletal: No lower extremity tenderness nor edema. No gross deformities of extremities. Neurologic:  Normal speech and language. No gross focal neurologic deficits are appreciated.  Skin:  Skin is warm, dry and intact. No rash noted.   ____________________________________________   LABS (all labs ordered are listed, but only abnormal results are displayed)  Labs Reviewed  BASIC METABOLIC PANEL - Abnormal; Notable for the following components:      Result Value   Glucose, Bld 130 (*)    Calcium 8.8 (*)    All other components within normal limits  D-DIMER, QUANTITATIVE - Abnormal; Notable for the following components:   D-Dimer, Quant 0.63 (*)    All other components within normal limits  BRAIN NATRIURETIC PEPTIDE - Abnormal; Notable for the following components:   B Natriuretic Peptide 123.5 (*)    All other components within normal limits  HEPATIC FUNCTION PANEL - Abnormal; Notable for the following components:   AST 43 (*)    ALT 57 (*)    All other components within normal limits  GLUCOSE, CAPILLARY - Abnormal; Notable for the following components:   Glucose-Capillary 155 (*)    All other components within normal  limits  CBG MONITORING, ED - Abnormal; Notable for the following components:   Glucose-Capillary 140 (*)    All other components within normal limits  TROPONIN I (HIGH SENSITIVITY) - Abnormal; Notable for the following components:   Troponin I (High Sensitivity) 288 (*)    All other components within normal limits  TROPONIN I (HIGH SENSITIVITY) - Abnormal; Notable for the  following components:   Troponin I (High Sensitivity) 285 (*)    All other components within normal limits  TROPONIN I (HIGH SENSITIVITY) - Abnormal; Notable for the following components:   Troponin I (High Sensitivity) 226 (*)    All other components within normal limits  RESP PANEL BY RT-PCR (FLU A&B, COVID) ARPGX2  CBC  LIPASE, BLOOD  HEPARIN LEVEL (UNFRACTIONATED)  TSH  HEMOGLOBIN A1C  LIPID PANEL  TROPONIN I (HIGH SENSITIVITY)   ____________________________________________  EKG   EKG Interpretation  Date/Time:  Monday December 09 2021 19:05:34 EST Ventricular Rate:  72 PR Interval:  270 QRS Duration: 88 QT Interval:  422 QTC Calculation: 462 R Axis:   52 Text Interpretation: Sinus rhythm with sinus arrhythmia with 1st degree A-V block with occasional Premature ventricular complexes Otherwise normal ECG When compared with ECG of 01-Jun-2019 17:20, PREVIOUS ECG IS PRESENT Similar to prior Confirmed by Nanda Quinton [phone]) on 12/09/2021 7:10:57 PM        ____________________________________________  RADIOLOGY  DG Chest 2 View  Result Date: 12/09/2021 CLINICAL DATA:  Central chest pain since yesterday EXAM: CHEST - 2 VIEW COMPARISON:  06/01/2019 FINDINGS: The heart size and mediastinal contours are within normal limits. Both lungs are clear. The visualized skeletal structures are unremarkable. IMPRESSION: No active cardiopulmonary disease. Electronically Signed   By: Randa Ngo M.D.   On: 12/09/2021 19:51   CT Angio Chest PE W and/or Wo Contrast  Result Date: 12/09/2021 CLINICAL DATA:  Pulmonary embolism (PE) suspected, positive D-dimer EXAM: CT ANGIOGRAPHY CHEST WITH CONTRAST TECHNIQUE: Multidetector CT imaging of the chest was performed using the standard protocol during bolus administration of intravenous contrast. Multiplanar CT image reconstructions and MIPs were obtained to evaluate the vascular anatomy. CONTRAST:  130mL OMNIPAQUE IOHEXOL 350 MG/ML SOLN COMPARISON:   None. FINDINGS: Cardiovascular: Satisfactory opacification of the pulmonary arteries to the segmental level. No evidence of pulmonary embolism. Normal heart size. No significant pericardial effusion. The thoracic aorta is normal in caliber. Mild atherosclerotic plaque of the thoracic aorta. Coronary artery calcifications. Mediastinum/Nodes: Borderline enlarged 1 cm left axillary lymph node. Otherwise asymmetrically prominent but nonenlarged left axillary lymph nodes. No enlarged mediastinal, hilar, or axillary lymph nodes. Thyroid gland, trachea, and esophagus demonstrate no significant findings. Lungs/Pleura: No focal consolidation. No pulmonary nodule. No pulmonary mass. No pleural effusion. No pneumothorax. Upper Abdomen: No acute abnormality.  Likely splenule (4:97). Musculoskeletal: No chest wall abnormality. No suspicious lytic or blastic osseous lesions. No acute displaced fracture. Multilevel degenerative changes of the spine. Degenerative changes of the left shoulder. Review of the MIP images confirms the above findings. IMPRESSION: 1. No pulmonary embolus. 2. No acute intrathoracic abnormality. 3. Borderline enlarged 1 cm left axillary lymph node. Otherwise asymmetrically prominent but nonenlarged left axillary lymph nodes. Correlate with recent left-sided COVID vaccine or with diagnostic mammography if no recent left-sided COVID vaccine. 4.  Aortic Atherosclerosis (ICD10-I70.0). Electronically Signed   By: Iven Finn M.D.   On: 12/09/2021 21:16    ____________________________________________   PROCEDURES  Procedure(s) performed:   Procedures  CRITICAL CARE Performed by: Margette Fast Total critical  care time: 35 minutes Critical care time was exclusive of separately billable procedures and treating other patients. Critical care was necessary to treat or prevent imminent or life-threatening deterioration. Critical care was time spent personally by me on the following activities:  development of treatment plan with patient and/or surrogate as well as nursing, discussions with consultants, evaluation of patient's response to treatment, examination of patient, obtaining history from patient or surrogate, ordering and performing treatments and interventions, ordering and review of laboratory studies, ordering and review of radiographic studies, pulse oximetry and re-evaluation of patient's condition.  Nanda Quinton, MD Emergency Medicine  ____________________________________________   INITIAL IMPRESSION / ASSESSMENT AND PLAN / ED COURSE  Pertinent labs & imaging results that were available during my care of the patient were reviewed by me and considered in my medical decision making (see chart for details).    Patient Vitals for the past 24 hrs:  BP Temp Temp src Pulse Resp SpO2 Height Weight  12/10/21 1048 (!) 173/84 98.2 F (36.8 C) Oral 67 18 100 % -- --  12/10/21 0900 (!) 149/68 -- -- 63 18 96 % -- --  12/10/21 0830 135/66 -- -- 63 -- 98 % -- --  12/10/21 0723 139/75 98.2 F (36.8 C) Oral 60 18 98 % -- --  12/10/21 0500 124/69 -- -- (!) 58 18 94 % -- --  12/10/21 0430 131/68 -- -- (!) 58 16 94 % -- --  12/10/21 0400 134/70 -- -- (!) 56 -- 95 % -- --  12/10/21 0330 126/72 -- -- (!) 57 -- 94 % -- --  12/10/21 0300 137/71 -- -- (!) 58 -- 94 % -- --  12/10/21 0230 (!) 145/78 -- -- 65 18 97 % -- --  12/10/21 0200 (!) 145/84 -- -- 60 -- 97 % -- --  12/10/21 0130 (!) 157/66 -- -- (!) 59 -- 94 % -- --  12/10/21 0100 (!) 145/84 -- -- 61 -- 99 % -- --  12/10/21 0030 (!) 156/76 -- -- 60 -- 97 % -- --  12/10/21 0000 (!) 149/91 -- -- 65 16 99 % -- --  12/09/21 2330 (!) 155/78 -- -- 65 16 97 % -- --  12/09/21 2300 (!) 158/76 -- -- 62 16 97 % -- --  12/09/21 2230 (!) 156/77 -- -- 62 -- 96 % -- --  12/09/21 2200 (!) 172/85 -- -- 60 -- 99 % -- --  12/09/21 2130 (!) 157/89 -- -- 69 17 99 % -- --  12/09/21 2102 (!) 172/84 -- -- 83 18 100 % -- --  12/09/21 2030 (!) 155/90 --  -- (!) 58 18 100 % -- --  12/09/21 2000 (!) 147/77 -- -- 61 18 100 % -- --  12/09/21 1941 (!) 161/82 -- -- 60 17 99 % -- --  12/09/21 1907 -- -- -- -- -- -- 5\' 5"  (1.651 m) 111.1 kg  12/09/21 1902 (!) 169/82 98.6 F (37 C) Oral 72 20 96 % -- --    Medical Decision Making: Summary:  Patient presents to the emergency department with fairly atypical chest pain since last night with stabbing type quality "in my heart."  No tachycardia or hypoxemia.  No prior history of ACS but patient does have several risk factors.    Critical Interventions- ASA, CXR, labs; to evaluate  Chief Complaint  Patient presents with   Chest Pain    and assess for illness characterized as Acute, Uncertain Prognosis, Complicated, Systemic Symptoms, and  Threat to Life/Bodily Function    This patient is Presenting for Evaluation of chest pain, which does require a range of treatment options, and is a complaint that involves a high risk of morbidity and mortality.  The Differential Diagnoses include all life-threatening causes for chest pain. This includes but is not exclusive to acute coronary syndrome, aortic dissection, pulmonary embolism, cardiac tamponade, community-acquired pneumonia, pericarditis, musculoskeletal chest wall pain, etc.   I decided to review pertinent External Data, and in summary patient with no heart cath, stress test, or ECHO in our system for review. Prior renal function WNL.   Clinical Laboratory Tests Ordered, included CBC, Metabolic panel, and Troponin . Review indicates elevated troponin.   Radiologic Tests Ordered, included CXR and CTA PE chest.  I independently Visualized: CTA and CXR Images, which show No PE, CAP, or effusion. No PNX.   Cardiac Monitor Tracing which shows NSR. No arrhythmia or ectopy.    09:53 PM-Consult complete with Dr. Verita Lamb. Patient case explained and discussed. Requirement for admission agreed on. Cardiology agrees to admit patient for further evaluation  and treatment. Temporary admit orders placed.    09:54 PM  Troponin elevated at 288.  D-dimer also slightly elevated.  CTA independently reviewed showing no PE or other acute process.  Discussed the case with cardiology who will admit for NSTEMI.    Discussed patient's case with Cardiology to request admission. Patient and family (if present) updated with plan. Care transferred to Cardiology service.  I reviewed all nursing notes, vitals, pertinent old records, EKGs, labs, imaging (as available).    ____________________________________________  FINAL CLINICAL IMPRESSION(S) / ED DIAGNOSES  Final diagnoses:  NSTEMI (non-ST elevated myocardial infarction) ([place])     MEDICATIONS GIVEN DURING THIS VISIT:  Medications  heparin ADULT infusion 100 units/mL (25000 units/239mL) (1,100 Units/hr Intravenous New Bag/Given 12/09/21 2112)  nitroGLYCERIN (NITROSTAT) SL tablet 0.4 mg (has no administration in time range)  acetaminophen (TYLENOL) tablet 650 mg (has no administration in time range)  ondansetron (ZOFRAN) injection 4 mg (has no administration in time range)  zolpidem (AMBIEN) tablet 5 mg (has no administration in time range)  sodium chloride flush (NS) 0.9 % injection 3 mL (has no administration in time range)  sodium chloride flush (NS) 0.9 % injection 3 mL (has no administration in time range)  0.9 %  sodium chloride infusion (has no administration in time range)  aspirin EC tablet 81 mg (has no administration in time range)  ALPRAZolam (XANAX) tablet 0.25 mg (has no administration in time range)  amLODipine (NORVASC) tablet 10 mg (has no administration in time range)  atorvastatin (LIPITOR) tablet 40 mg (has no administration in time range)  polyvinyl alcohol (LIQUIFILM TEARS) 1.4 % ophthalmic solution 1 drop (has no administration in time range)  aspirin chewable tablet 243 mg (243 mg Oral Given 12/09/21 1943)  heparin bolus via infusion 4,000 Units (4,000 Units Intravenous Bolus  from Bag 12/09/21 2113)  iohexol (OMNIPAQUE) 350 MG/ML injection 100 mL (100 mLs Intravenous Contrast Given 12/09/21 2046)     Note:  This document was prepared using Dragon voice recognition software and may include unintentional dictation errors.  Nanda Quinton, MD, [facility] Emergency Medicine    Enedelia Martorelli, [facility], MD 12/10/21 1257

## 2021-12-09 NOTE — ED Notes (Signed)
Patient transported to CT 

## 2021-12-09 NOTE — Progress Notes (Signed)
ANTICOAGULATION CONSULT NOTE - Initial Consult  Pharmacy Consult for heparin Indication: chest pain/ACS  Allergies  Allergen Reactions   Aspirin Other (See Comments)    On meloxicam   Keflex [Cephalexin] Hives   Losartan Rash    Patient Measurements: Height: 5\' 5"  (165.1 cm) Weight: 111.1 kg (245 lb) IBW/kg (Calculated) : 57 Heparin Dosing Weight: 83.2kg  Vital Signs: Temp: 98.6 F (37 C) (01/02 1902) Temp Source: Oral (01/02 1902) BP: 147/77 (01/02 2000) Pulse Rate: 61 (01/02 2000)  Labs: Recent Labs    12/09/21 1936  HGB 14.4  HCT 41.3  PLT 283  CREATININE 0.98  TROPONINIHS 288*    Estimated Creatinine Clearance: 65.3 mL/min (by C-G formula based on SCr of 0.98 mg/dL).   Medical History: Past Medical History:  Diagnosis Date   Breast cancer ([place])    Diabetes mellitus without complication ([place])    Hypertension     Assessment: Katherine Pratt presenting with CP, elevated troponin.  She is not on anticoagulation PTA, CBC wnl   Goal of Therapy:  Heparin level 0.3-0.7 units/ml Monitor platelets by anticoagulation protocol: Yes   Plan:  Heparin 4000 units IV x 1, and gtt at 1100 units/hr F/u 6 hour heparin level F/u cards eval and recs  Bertis Ruddy, PharmD Clinical Pharmacist ED Pharmacist Phone # [phone] 12/09/2021 8:40 PM

## 2021-12-09 NOTE — ED Notes (Signed)
Critical lab reported from lab trop. 228 reported to LONG MD

## 2021-12-09 NOTE — H&P (Addendum)
Cardiology Admission History and Physical:   Patient ID: Rayyan Orsborn MRN: [id number]; DOB: [birth year]   Admission date: 12/09/2021  Primary Care Provider: Mariel Sleet [facility] Cardiologist: None  [facility] Electrophysiologist:  None   Chief Complaint: chest pain   Patient Profile:   Niomi Valent is a 71 y.o. female with HLD, DM2, obesity and HTN with CP and elevated hsT c/w NSTEMI transferred from [facility] for further evaluation at the request of Dr. Laverta Baltimore.   History of Present Illness:   Per chart review  Ms. Cogan presented to [facility] ED for intermittent chest pain that started 01/01 at midnight.  She reported intermittent chest pain that was stabbing like and fairly severe on and off throughout the night.  Symptoms did seem to improve sometimes with sitting up however she denies any pleuritic nature or associated SOB.  She denied any fevers or URI symptoms.   However, she got a COVID shot on the 29th and did have some chills and felt weak on the 30th.   She rested most of 01/02 and her symptoms have mostly improved however after going outside to take the trash out to the curb she experienced recurrent chest heaviness which concerned her.  After speaking to family and friends she was encouraged to be evaluated in the emergency department.  She has no prior history of ACS or coronary evaluation.  VS at [facility] ED evaluation: P 72, BP 169/82, RR 20, O2 96%/RA, T 98.51F Labs reviewed: notable for elevated but flat hsT (288 at 19:36->285 at 21:48). CBC/BMP without significant abnormalities, HFP with mild transaminitis (AST 43, ALT 57, bl nl 2 years ago). D-dimer mildly positive (0.63, positive 2 years ago at 0.55). BNP mildly elevated (123.5). Lipase normal. Covid negative.   She had taken ASA 81 mg x1 at home and was given additional for full load in ED. Started on heparin gtt after first positive hsT resulted.   The stabbing chest pain started Sunday night as she was  going to bed. It was severe, > 10/10, she could not lie down. She sat on the side of the bed and rocked. Her L arm hurt on the back of her shoulder. No SOB, N&V or diaphoresis. She took a Pepcid and was able to get some sleep.   On the 2nd, she was mainly rested. However, she had to take the trash out and got some sharp pain, not as bad as the night before. She felt very weak, legs did not want to hold her. She made it inside, but knew something was wrong. She called a friend to take her to the ER.  She has not had any more of the sharp pain, just some mild discomfort in her shoulders and back.  She has never had this pain before.   She is not aware of the PVCs, but had an episode about 2 weeks ago when she was lying in bed and had tachypalpitations. She felt her heart beating very fast. She did not take any rx and the sx resolved in about 10". That had not happened before and has not happened since.  She has been told she has a heart murmur.    Past Medical History:  Diagnosis Date   Breast cancer ([place])    Diabetes mellitus without complication ([place])    HLD (hyperlipidemia) 08/01/2015   Hypertension    Past Surgical History:  Procedure Laterality Date   ABDOMINAL HYSTERECTOMY     APPENDECTOMY  BREAST LUMPECTOMY     ELBOW SURGERY     HERNIA REPAIR     KNEE SURGERY      Medications Prior to Admission: Prior to Admission medications   Medication Sig Start Date End Date Taking? Authorizing Provider  ACCU-CHEK GUIDE test strip  12/10/19   [provider]  amLODipine (NORVASC) 10 MG tablet  07/09/19   [provider]  atorvastatin (LIPITOR) 40 MG tablet Take by mouth. 07/15/19   [provider]  clotrimazole (LOTRIMIN) 1 % external solution Apply 1 application topically 2 (two) times daily. In between toes 11/29/19   Landis Martins, DPM  diphenhydrAMINE (BENADRYL) 25 MG tablet Take 2 tablets (50 mg total) by mouth every 6 (six) hours as needed. 07/02/19   Drenda Freeze, MD  EPINEPHrine 0.3 mg/0.3 mL IJ SOAJ injection 0.3 mg as needed. 01/04/15   [provider]  famotidine (PEPCID) 20 MG tablet Take 1 tablet (20 mg total) by mouth 2 (two) times daily. 07/19/14   Palumbo, April, MD  GLIPIZIDE ER PO Take by mouth.    [provider]  meclizine (ANTIVERT) 25 MG tablet Take 1 tablet (25 mg total) by mouth 3 (three) times daily as needed for dizziness. 12/29/17   Molpus, John, MD  meloxicam (MOBIC) 15 MG tablet Take by mouth. 11/09/19   [provider]  metFORMIN (GLUCOPHAGE-XR) 500 MG 24 hr tablet  07/09/19   [provider]  pioglitazone (ACTOS) 30 MG tablet  07/09/19   [provider]  pregabalin (LYRICA) 25 MG capsule  11/09/19   [provider]  sulfamethoxazole-trimethoprim (BACTRIM) 400-80 MG tablet Take 1 tablet by mouth 2 (two) times daily. 11/29/19   Landis Martins, DPM  TRULICITY 1.5 ZY/6.0YT [facility]  07/08/19   [provider]    Allergies:    Allergies  Allergen Reactions   Aspirin Other (See Comments)    On meloxicam   Keflex [Cephalexin] Hives   Losartan Rash   Social History:   Social History   Socioeconomic History   Marital status: Married    Spouse name: Not on file   Number of children: Not on file   Years of education: Not on file   Highest education level: Not on file  Occupational History   Not on file  Tobacco Use   Smoking status: Never   Smokeless tobacco: Never  Vaping Use   Vaping Use: Never used  Substance and Sexual Activity   Alcohol use: No   Drug use: No   Sexual activity: Not on file  Other Topics Concern   Not on file  Social History Narrative   Not on file   Social Determinants of Health   Financial Resource Strain: Not on file  Food Insecurity: Not on file  Transportation Needs: Not on file  Physical Activity: Not on file  Stress: Not on file  Social Connections: Not on file  Intimate Partner Violence: Not on file    Family  History:    Hypertension Mother   Rheum arthritis Mother   Heart disease Mother   Diabetes Father   Hypertension Father   Heart disease Father   Diabetes Paternal Aunt   Diabetes Sister    ROS:   Review of Systems: [y] = yes, [ ]  = no      General: Weight gain [ ] ; Weight loss [ ] ; Anorexia [ ] ; Fatigue [ ] ; Fever [ ] ; Chills [ ] ; Weakness [ ]    Cardiac: Chest  pain/pressure [y]; Resting SOB [ ] ; Exertional SOB [ ] ; Orthopnea [ ] ; Pedal Edema [ ] ; Palpitations [x ]; Syncope [ ] ; Presyncope [ ] ; Paroxysmal nocturnal dyspnea [ ]    Pulmonary: Cough [ ] ; Wheezing [ ] ; Hemoptysis [ ] ; Sputum [ ] ; Snoring [ ]    GI: Vomiting [ ] ; Dysphagia [ ] ; Melena [ ] ; Hematochezia [ ] ; Heartburn [ ] ; Abdominal pain [ ] ; Constipation [ ] ; Diarrhea [ ] ; BRBPR [ ]    GU: Hematuria [ ] ; Dysuria [ ] ; Nocturia [ ]  Vascular: Pain in legs with walking [ ] ; Pain in feet with lying flat [ ] ; Non-healing sores [ ] ; Stroke [ ] ; TIA [ ] ; Slurred speech [ ] ;   Neuro: Headaches [ ] ; Vertigo [ ] ; Seizures [ ] ; Paresthesias [ ] ;Blurred vision [ ] ; Diplopia [ ] ; Vision changes [ ]    Ortho/Skin: Arthritis [ ] ; Joint pain [ ] ; Muscle pain [ ] ; Joint swelling [ ] ; Back Pain [ ] ; Rash [ ]    Psych: Depression [ ] ; Anxiety [ ]    Heme: Bleeding problems [ ] ; Clotting disorders [ ] ; Anemia [ ]    Endocrine: Diabetes [ ] ; Thyroid dysfunction [ ]    Physical Exam/Data:   Vitals:   12/10/21 0723 12/10/21 0830 12/10/21 0900 12/10/21 1048  BP: 139/75 135/66 (!) 149/68 (!) 173/84  Pulse: 60 63 63 67  Resp: 18  18 18   Temp: 98.2 F (36.8 C)   98.2 F (36.8 C)  TempSrc: Oral   Oral  SpO2: 98% 98% 96% 100%  Weight:      Height:        Intake/Output Summary (Last 24 hours) at 12/10/2021 1222 Last data filed at 12/10/2021 0826 Gross per 24 hour  Intake --  Output 1950 ml  Net -1950 ml   Last 3 Weights 12/09/2021 07/18/2021 07/02/2019  Weight (lbs) 245 lb 249 lb 251 lb  Weight (kg) 111.131 kg 112.946 kg 113.853 kg     Body mass  index is 40.77 kg/m.  General:  Well nourished, well developed, in no acute distress HEENT: normal Lymph: no adenopathy Neck: no JVD Endocrine:  No thryomegaly Vascular: No carotid bruits; FA pulses 2+ bilaterally without bruits  Cardiac:  normal S1, S2; RRR; 2/6 murmur  Lungs:  clear to auscultation bilaterally, no wheezing, rhonchi or rales  Abd: soft, nontender, no hepatomegaly  Ext: no edema Musculoskeletal:  No deformities, BUE and BLE strength normal and equal Skin: warm and dry  Neuro:  CNs 2-12 intact, no focal abnormalities noted Psych:  Normal affect   EKG:  The ECG that was done 12/09/21 (19:05:34) was personally reviewed and demonstrates NSR (72), PR 270, QRS 88, Qtc 462, 1st deg AVB, no acute ischemic changes   Relevant CV Studies:  CTPE Result date: 12/09/21 1. No pulmonary embolus. 2. No acute intrathoracic abnormality. 3. Borderline enlarged 1 cm left axillary lymph node. Otherwise asymmetrically prominent but nonenlarged left axillary lymph nodes. Correlate with recent left-sided COVID vaccine or with diagnostic mammography if no recent left-sided COVID vaccine. 4.  Aortic Atherosclerosis (ICD10-I70.0).   Laboratory Data:  High Sensitivity Troponin:   Recent Labs  Lab 12/09/21 1936 12/09/21 2148 12/10/21 0535  TROPONINIHS 288* 285* 226*      Chemistry Recent Labs  Lab 12/09/21 1936  NA 138  K 3.5  CL 104  CO2 24  GLUCOSE 130*  BUN 13  CREATININE 0.98  CALCIUM 8.8*  GFRNONAA >60  ANIONGAP 10    Recent  Labs  Lab 12/09/21 1936  PROT 7.1  ALBUMIN 3.5  AST 43*  ALT 57*  ALKPHOS 114  BILITOT 0.4   Hematology Recent Labs  Lab 12/09/21 1936  WBC 5.0  RBC 4.50  HGB 14.4  HCT 41.3  MCV 91.8  MCH 32.0  MCHC 34.9  RDW 12.6  PLT 283   BNP Recent Labs  Lab 12/09/21 1936  BNP 123.5*    DDimer  Recent Labs  Lab 12/09/21 1936  DDIMER 0.63*  No results found for: HGBA1C No results found for: TSH No results found for: CHOL,  HDL, LDLCALC, LDLDIRECT, TRIG, CHOLHDL   Radiology/Studies:  DG Chest 2 View  Result Date: 12/09/2021 CLINICAL DATA:  Central chest pain since yesterday EXAM: CHEST - 2 VIEW COMPARISON:  06/01/2019 FINDINGS: The heart size and mediastinal contours are within normal limits. Both lungs are clear. The visualized skeletal structures are unremarkable. IMPRESSION: No active cardiopulmonary disease. Electronically Signed   By: Randa Ngo M.D.   On: 12/09/2021 19:51   CT Angio Chest PE W and/or Wo Contrast  Result Date: 12/09/2021 CLINICAL DATA:  Pulmonary embolism (PE) suspected, positive D-dimer EXAM: CT ANGIOGRAPHY CHEST WITH CONTRAST TECHNIQUE: Multidetector CT imaging of the chest was performed using the standard protocol during bolus administration of intravenous contrast. Multiplanar CT image reconstructions and MIPs were obtained to evaluate the vascular anatomy. CONTRAST:  144mL OMNIPAQUE IOHEXOL 350 MG/ML SOLN COMPARISON:  None. FINDINGS: Cardiovascular: Satisfactory opacification of the pulmonary arteries to the segmental level. No evidence of pulmonary embolism. Normal heart size. No significant pericardial effusion. The thoracic aorta is normal in caliber. Mild atherosclerotic plaque of the thoracic aorta. Coronary artery calcifications. Mediastinum/Nodes: Borderline enlarged 1 cm left axillary lymph node. Otherwise asymmetrically prominent but nonenlarged left axillary lymph nodes. No enlarged mediastinal, hilar, or axillary lymph nodes. Thyroid gland, trachea, and esophagus demonstrate no significant findings. Lungs/Pleura: No focal consolidation. No pulmonary nodule. No pulmonary mass. No pleural effusion. No pneumothorax. Upper Abdomen: No acute abnormality.  Likely splenule (4:97). Musculoskeletal: No chest wall abnormality. No suspicious lytic or blastic osseous lesions. No acute displaced fracture. Multilevel degenerative changes of the spine. Degenerative changes of the left shoulder. Review  of the MIP images confirms the above findings. IMPRESSION: 1. No pulmonary embolus. 2. No acute intrathoracic abnormality. 3. Borderline enlarged 1 cm left axillary lymph node. Otherwise asymmetrically prominent but nonenlarged left axillary lymph nodes. Correlate with recent left-sided COVID vaccine or with diagnostic mammography if no recent left-sided COVID vaccine. 4.  Aortic Atherosclerosis (ICD10-I70.0). Electronically Signed   By: Iven Finn M.D.   On: 12/09/2021 21:16      TIMI Risk Score for Unstable Angina or Non-ST Elevation MI:   The patient's TIMI risk score is 4, which indicates a 20% risk of all cause mortality, new or recurrent myocardial infarction or need for urgent revascularization in the next 14 days.     Assessment and Plan:   NSTEMI - pt had severe CP at rest, followed by CP/weakness w/ exertion - Ez elevated, indicating NSTEMI - mult CRFs, may not be well-controlled - feel definitive eval is best, discussed cath vs cardiac CT w/ her - w/ elevated ez, feel cath is best - Cardiac catheterization was discussed with the patient fully. The patient understands that risks include but are not limited to stroke (1 in 1000), death (1 in 60), kidney failure [usually temporary] (1 in 500), bleeding (1 in 200), allergic reaction [possibly serious] (1 in 200).  The patient  understands and is willing to proceed.   - pt on board for tomorrow.  - she was told not to take ASA because of meloxicam, but she is not on that now, ok to use - continue heparin, does not need nitro - cath tomorrow, not able to do today - statin and BB as BP/HR will allow - ck echo  2. HTN - pta on amlodipine 10 mg qd - BP has been mostly elevated since admit - HR high 50s at times, MD advise on trying low-dose Coreg  3. DM - A1c 07/2021 was 7.5, improved from previous - use SSI now - she was on weekly Trulicity pta  4. HLD - was on Lipitor 40 mg qd pta, this was started 04/2021 for LDL 134 -  ck profile - may need to increase to 80 mg qd    Severity of Illness: The appropriate patient status for this patient is INPATIENT. Inpatient status is judged to be reasonable and necessary in order to provide the required intensity of service to ensure the patient's safety. The patient's presenting symptoms, physical exam findings, and initial radiographic and laboratory data in the context of their chronic comorbidities is felt to place them at high risk for further clinical deterioration. Furthermore, it is not anticipated that the patient will be medically stable for discharge from the hospital within 2 midnights of admission.   * I certify that at the point of admission it is my clinical judgment that the patient will require inpatient hospital care spanning beyond 2 midnights from the point of admission due to high intensity of service, high risk for further deterioration and high frequency of surveillance required.*   For questions or updates, please contact [place] Please consult [email or web address] for contact info under   Signed, Rosaria Ferries, PA-C  12/10/2021 12:22 PM   I have personally seen and examined this patient. I agree with the assessment and plan as outlined above.  71 yo female with history of DM, HTN, HLD, obesity transferred from [facility] with c/o chest pain. Troponin mildly elevated with flat trend. D-dimer slightly elevated. Chest CTA without PE, aortic dissection. Evidence of coronary calcification. No chest pain at this time. EKG with sinus with PAC, no ischemic changes. (Personally reviewed). Labs reviewed.  My exam: Obese female in NAD. CV:RRR, no murmurs. Pulm: Lungs clear. Ext: no edema.  Plan: Unstable angina/NSTEMI: Will plan cardiac cath tomorrow to exclude CAD. Continue IV heparin, ASA and statin. No beta blocker with bradycardia.  Echo pending today.   Lauree Chandler 12/10/2021 12:46 PM

## 2021-12-09 NOTE — ED Triage Notes (Addendum)
Pt c/o intermittent CP started last night-denies fever/flu sx-NAD-steady gait

## 2021-12-10 ENCOUNTER — Encounter (HOSPITAL_COMMUNITY): Payer: Self-pay | Admitting: Student in an Organized Health Care Education/Training Program

## 2021-12-10 DIAGNOSIS — Z79899 Other long term (current) drug therapy: Secondary | ICD-10-CM | POA: Diagnosis not present

## 2021-12-10 DIAGNOSIS — Z8261 Family history of arthritis: Secondary | ICD-10-CM | POA: Diagnosis not present

## 2021-12-10 DIAGNOSIS — Z833 Family history of diabetes mellitus: Secondary | ICD-10-CM | POA: Diagnosis not present

## 2021-12-10 DIAGNOSIS — I2510 Atherosclerotic heart disease of native coronary artery without angina pectoris: Secondary | ICD-10-CM | POA: Diagnosis not present

## 2021-12-10 DIAGNOSIS — I214 Non-ST elevation (NSTEMI) myocardial infarction: Secondary | ICD-10-CM | POA: Diagnosis present

## 2021-12-10 DIAGNOSIS — Z01810 Encounter for preprocedural cardiovascular examination: Secondary | ICD-10-CM | POA: Diagnosis not present

## 2021-12-10 DIAGNOSIS — E119 Type 2 diabetes mellitus without complications: Secondary | ICD-10-CM | POA: Diagnosis present

## 2021-12-10 DIAGNOSIS — D62 Acute posthemorrhagic anemia: Secondary | ICD-10-CM | POA: Diagnosis not present

## 2021-12-10 DIAGNOSIS — Z923 Personal history of irradiation: Secondary | ICD-10-CM | POA: Diagnosis not present

## 2021-12-10 DIAGNOSIS — Z853 Personal history of malignant neoplasm of breast: Secondary | ICD-10-CM | POA: Diagnosis not present

## 2021-12-10 DIAGNOSIS — Z20822 Contact with and (suspected) exposure to covid-19: Secondary | ICD-10-CM | POA: Diagnosis present

## 2021-12-10 DIAGNOSIS — E7800 Pure hypercholesterolemia, unspecified: Secondary | ICD-10-CM | POA: Diagnosis present

## 2021-12-10 DIAGNOSIS — Z7985 Long-term (current) use of injectable non-insulin antidiabetic drugs: Secondary | ICD-10-CM | POA: Diagnosis not present

## 2021-12-10 DIAGNOSIS — I440 Atrioventricular block, first degree: Secondary | ICD-10-CM | POA: Diagnosis present

## 2021-12-10 DIAGNOSIS — I25110 Atherosclerotic heart disease of native coronary artery with unstable angina pectoris: Secondary | ICD-10-CM | POA: Diagnosis present

## 2021-12-10 DIAGNOSIS — Z6841 Body Mass Index (BMI) 40.0 and over, adult: Secondary | ICD-10-CM | POA: Diagnosis not present

## 2021-12-10 DIAGNOSIS — Z8249 Family history of ischemic heart disease and other diseases of the circulatory system: Secondary | ICD-10-CM | POA: Diagnosis not present

## 2021-12-10 DIAGNOSIS — I10 Essential (primary) hypertension: Secondary | ICD-10-CM | POA: Diagnosis present

## 2021-12-10 LAB — TROPONIN I (HIGH SENSITIVITY)
Troponin I (High Sensitivity): 226 ng/L (ref <18)
Troponin I (High Sensitivity): 130 ng/L (ref <18)
Troponin I (High Sensitivity): 126 ng/L (ref <18)

## 2021-12-10 LAB — LIPID PANEL
Cholesterol: 154 mg/dL (ref 0–200)
HDL: 32 mg/dL — ABNORMAL LOW (ref >40)
LDL Cholesterol: 103 mg/dL — ABNORMAL HIGH (ref 0–99)
Total CHOL/HDL Ratio: 4.8 RATIO
Triglycerides: 96 mg/dL (ref <150)
VLDL: 19 mg/dL (ref 0–40)

## 2021-12-10 LAB — HEPARIN LEVEL (UNFRACTIONATED): Heparin Unfractionated: 0.47 IU/mL (ref 0.30–0.70)

## 2021-12-10 LAB — GLUCOSE, CAPILLARY
Glucose-Capillary: 155 mg/dL — ABNORMAL HIGH (ref 70–99)
Glucose-Capillary: 154 mg/dL — ABNORMAL HIGH (ref 70–99)

## 2021-12-10 LAB — TSH: TSH: 1.489 u[IU]/mL (ref 0.350–4.500)

## 2021-12-10 LAB — HEMOGLOBIN A1C
Hgb A1c MFr Bld: 7 % — ABNORMAL HIGH (ref 4.8–5.6)
Mean Plasma Glucose: 154.2 mg/dL

## 2021-12-10 LAB — CBG MONITORING, ED: Glucose-Capillary: 140 mg/dL — ABNORMAL HIGH (ref 70–99)

## 2021-12-10 MED ORDER — ASPIRIN 81 MG PO CHEW
81.0000 mg | CHEWABLE_TABLET | ORAL | Status: AC
  Administered 2021-12-11: 81 mg via ORAL
  Filled 2021-12-10: qty 1

## 2021-12-10 MED ORDER — SODIUM CHLORIDE 0.9% FLUSH: 3.0000 mL | INTRAVENOUS | Status: DC | PRN

## 2021-12-10 MED ORDER — ASPIRIN EC 81 MG PO TBEC
81.0000 mg | DELAYED_RELEASE_TABLET | Freq: Every day | ORAL | Status: DC
  Administered 2021-12-12: 81 mg via ORAL
  Filled 2021-12-10: qty 1

## 2021-12-10 MED ORDER — ONDANSETRON HCL 4 MG/2ML IJ SOLN: 4.0000 mg | Freq: Four times a day (QID) | INTRAMUSCULAR | Status: DC | PRN

## 2021-12-10 MED ORDER — ACETAMINOPHEN 325 MG PO TABS: 650.0000 mg | ORAL_TABLET | ORAL | Status: DC | PRN

## 2021-12-10 MED ORDER — SODIUM CHLORIDE 0.9 % IV SOLN: 250.0000 mL | INTRAVENOUS | Status: DC | PRN

## 2021-12-10 MED ORDER — SODIUM CHLORIDE 0.9 % WEIGHT BASED INFUSION
1.0000 mL/kg/h | INTRAVENOUS | Status: DC
  Administered 2021-12-11: 1 mL/kg/h via INTRAVENOUS

## 2021-12-10 MED ORDER — SODIUM CHLORIDE 0.9% FLUSH
3.0000 mL | Freq: Two times a day (BID) | INTRAVENOUS | Status: DC
  Administered 2021-12-11: 3 mL via INTRAVENOUS

## 2021-12-10 MED ORDER — AMLODIPINE BESYLATE 10 MG PO TABS
10.0000 mg | ORAL_TABLET | Freq: Every day | ORAL | Status: DC
  Administered 2021-12-10 – 2021-12-12 (×3): 10 mg via ORAL
  Filled 2021-12-10 (×3): qty 1

## 2021-12-10 MED ORDER — ALUM & MAG HYDROXIDE-SIMETH 200-200-20 MG/5ML PO SUSP
15.0000 mL | ORAL | Status: DC | PRN
  Administered 2021-12-10 – 2021-12-12 (×2): 15 mL via ORAL
  Filled 2021-12-10 (×2): qty 30

## 2021-12-10 MED ORDER — SODIUM CHLORIDE 0.9 % WEIGHT BASED INFUSION
3.0000 mL/kg/h | INTRAVENOUS | Status: AC
  Administered 2021-12-11: 3 mL/kg/h via INTRAVENOUS

## 2021-12-10 MED ORDER — NITROGLYCERIN 0.4 MG SL SUBL: 0.4000 mg | SUBLINGUAL_TABLET | SUBLINGUAL | Status: DC | PRN

## 2021-12-10 MED ORDER — ALPRAZOLAM 0.25 MG PO TABS: 0.2500 mg | ORAL_TABLET | Freq: Two times a day (BID) | ORAL | Status: DC | PRN

## 2021-12-10 MED ORDER — SODIUM CHLORIDE 0.9% FLUSH
3.0000 mL | Freq: Two times a day (BID) | INTRAVENOUS | Status: DC
  Administered 2021-12-10 – 2021-12-12 (×2): 3 mL via INTRAVENOUS

## 2021-12-10 MED ORDER — ZOLPIDEM TARTRATE 5 MG PO TABS
5.0000 mg | ORAL_TABLET | Freq: Every evening | ORAL | Status: DC | PRN
  Administered 2021-12-12: 5 mg via ORAL
  Filled 2021-12-10: qty 1

## 2021-12-10 MED ORDER — ATORVASTATIN CALCIUM 40 MG PO TABS
40.0000 mg | ORAL_TABLET | Freq: Every day | ORAL | Status: DC
  Administered 2021-12-10: 40 mg via ORAL
  Filled 2021-12-10 (×2): qty 1

## 2021-12-10 MED ORDER — POLYVINYL ALCOHOL 1.4 % OP SOLN
1.0000 [drp] | OPHTHALMIC | Status: DC | PRN
  Filled 2021-12-10: qty 15

## 2021-12-10 NOTE — ED Notes (Signed)
Notified by lab that heparin level cannot be drawn until courier physically present at the medcenter to transport.  Specimen will have to be redrawn at that time.

## 2021-12-10 NOTE — Progress Notes (Signed)
°   12/10/21 1740  Clinical Encounter Type  Visited With Patient  Visit Type Initial  Referral From Nurse  Consult/Referral To Chaplain   Chaplain Jorene Guest responded to the consult request for an Advance Directive. Ike Bene provided A.D. education. The patient wants to appoint her lifelong friend, Pamala Hurry. She has not spoken to [place] about it but will call her this evening. The patient said she has a daughter but did not want to put the responsibility on her. Chaplain Baker Janus advised her to inform the nurse when the document is ready for the notary. This note was prepared by Jeanine Luz, M.Div..  For questions please contact by phone [phone].

## 2021-12-10 NOTE — ED Notes (Signed)
Awaiting room assignment for admission

## 2021-12-10 NOTE — Care Management Important Message (Deleted)
Important Message  Patient Details  Name: Katherine Pratt MRN: [id number] Date of Birth: [birth year]   Medicare Important Message Given:  Yes     Shelda Altes 12/10/2021, 11:06 AM

## 2021-12-10 NOTE — Progress Notes (Signed)
Mobility Specialist Progress Note:   12/10/21 1528  Mobility  Activity Ambulated in hall  Level of Assistance Standby assist, set-up cues, supervision of patient - no hands on  Assistive Device  (IV pole)  Distance Ambulated (ft) 230 ft  Mobility Ambulated with assistance in hallway  Mobility Response Tolerated well  Mobility performed by Mobility specialist  $Mobility charge 1 Mobility   Pt received in bed willing to participate in mobility. No complaints of pain. Pt returned to bed with call bell in reach and all needs met.   [facility] Public librarian Phone [phone] Secondary Phone [phone]

## 2021-12-10 NOTE — Progress Notes (Signed)
[place] for heparin Indication: chest pain/ACS Brief A/P: Heparin level within goal range Continue Heparin at current rate  Allergies  Allergen Reactions   Aspirin Other (See Comments)    On meloxicam   Keflex [Cephalexin] Hives   Losartan Rash    Patient Measurements: Height: 5\' 5"  (165.1 cm) Weight: 111.1 kg (245 lb) IBW/kg (Calculated) : 57 Heparin Dosing Weight: 83.2kg  Vital Signs: Temp: 98.2 F (36.8 C) (01/03 0723) Temp Source: Oral (01/03 0723) BP: 139/75 (01/03 0723) Pulse Rate: 60 (01/03 0723)  Labs: Recent Labs    12/09/21 1936 12/09/21 2148 12/10/21 0535 12/10/21 0623  HGB 14.4  --   --   --   HCT 41.3  --   --   --   PLT 283  --   --   --   HEPARINUNFRC  --   --   --  0.47  CREATININE 0.98  --   --   --   TROPONINIHS 288* 285* 226*  --      Estimated Creatinine Clearance: 65.3 mL/min (by C-G formula based on SCr of 0.98 mg/dL).  Assessment: 71 y.o. female with chest pain for heparin   Goal of Therapy:  Heparin level 0.3-0.7 units/ml Monitor platelets by anticoagulation protocol: Yes   Plan:  Continue Heparin at current rate   Phillis Knack, PharmD, BCPS  12/10/2021 7:41 AM

## 2021-12-10 NOTE — ED Notes (Signed)
SBAR report called to receiving nurse Cloretta Ned, RN

## 2021-12-11 ENCOUNTER — Encounter (HOSPITAL_COMMUNITY): Payer: Self-pay | Admitting: Cardiovascular Disease

## 2021-12-11 ENCOUNTER — Inpatient Hospital Stay (HOSPITAL_COMMUNITY): Payer: Medicare HMO

## 2021-12-11 ENCOUNTER — Encounter (HOSPITAL_COMMUNITY)
Admission: EM | Disposition: A | Discharge status: Home / Self Care | Payer: Self-pay | Source: Home / Self Care | Attending: Surgery

## 2021-12-11 DIAGNOSIS — I214 Non-ST elevation (NSTEMI) myocardial infarction: Secondary | ICD-10-CM

## 2021-12-11 DIAGNOSIS — I2510 Atherosclerotic heart disease of native coronary artery without angina pectoris: Secondary | ICD-10-CM | POA: Diagnosis not present

## 2021-12-11 DIAGNOSIS — I25110 Atherosclerotic heart disease of native coronary artery with unstable angina pectoris: Secondary | ICD-10-CM

## 2021-12-11 HISTORY — PX: LEFT HEART CATH AND CORONARY ANGIOGRAPHY: CATH118249

## 2021-12-11 LAB — CBC
HCT: 38.5 % (ref 36.0–46.0)
Hemoglobin: 13 g/dL (ref 12.0–15.0)
MCH: 31.7 pg (ref 26.0–34.0)
MCHC: 33.8 g/dL (ref 30.0–36.0)
MCV: 93.9 fL (ref 80.0–100.0)
Platelets: 256 10*3/uL (ref 150–400)
RBC: 4.1 MIL/uL (ref 3.87–5.11)
RDW: 12.4 % (ref 11.5–15.5)
WBC: 5.8 10*3/uL (ref 4.0–10.5)
nRBC: 0 % (ref 0.0–0.2)

## 2021-12-11 LAB — COMPREHENSIVE METABOLIC PANEL
ALT: 39 U/L (ref 0–44)
AST: 25 U/L (ref 15–41)
Albumin: 2.9 g/dL — ABNORMAL LOW (ref 3.5–5.0)
Alkaline Phosphatase: 106 U/L (ref 38–126)
Anion gap: 10 (ref 5–15)
BUN: 11 mg/dL (ref 8–23)
CO2: 22 mmol/L (ref 22–32)
Calcium: 8.3 mg/dL — ABNORMAL LOW (ref 8.9–10.3)
Chloride: 106 mmol/L (ref 98–111)
Creatinine, Ser: 0.88 mg/dL (ref 0.44–1.00)
GFR, Estimated: >60 mL/min (ref >60)
Glucose, Bld: 169 mg/dL — ABNORMAL HIGH (ref 70–99)
Potassium: 3.7 mmol/L (ref 3.5–5.1)
Sodium: 138 mmol/L (ref 135–145)
Total Bilirubin: 0.6 mg/dL (ref 0.3–1.2)
Total Protein: 5.8 g/dL — ABNORMAL LOW (ref 6.5–8.1)

## 2021-12-11 LAB — ECHOCARDIOGRAM COMPLETE
Area-P 1/2: 2.38 cm2
Height: 65 in
S' Lateral: 2.7 cm
Weight: 3822.4 oz

## 2021-12-11 LAB — GLUCOSE, CAPILLARY
Glucose-Capillary: 152 mg/dL — ABNORMAL HIGH (ref 70–99)
Glucose-Capillary: 138 mg/dL — ABNORMAL HIGH (ref 70–99)
Glucose-Capillary: 157 mg/dL — ABNORMAL HIGH (ref 70–99)
Glucose-Capillary: 134 mg/dL — ABNORMAL HIGH (ref 70–99)

## 2021-12-11 LAB — HEPARIN LEVEL (UNFRACTIONATED): Heparin Unfractionated: 0.23 IU/mL — ABNORMAL LOW (ref 0.30–0.70)

## 2021-12-11 SURGERY — LEFT HEART CATH AND CORONARY ANGIOGRAPHY
Anesthesia: LOCAL

## 2021-12-11 MED ORDER — FENTANYL CITRATE (PF) 100 MCG/2ML IJ SOLN
INTRAMUSCULAR | Status: DC | PRN
  Administered 2021-12-11: 25 ug via INTRAVENOUS

## 2021-12-11 MED ORDER — MIDAZOLAM HCL 2 MG/2ML IJ SOLN
INTRAMUSCULAR | Status: DC | PRN
  Administered 2021-12-11: 2 mg via INTRAVENOUS

## 2021-12-11 MED ORDER — HYDRALAZINE HCL 20 MG/ML IJ SOLN: 10.0000 mg | INTRAMUSCULAR | Status: AC | PRN

## 2021-12-11 MED ORDER — HEPARIN (PORCINE) IN NACL 1000-0.9 UT/500ML-% IV SOLN
INTRAVENOUS | Status: AC
  Filled 2021-12-11: qty 500

## 2021-12-11 MED ORDER — OXYCODONE-ACETAMINOPHEN 5-325 MG PO TABS: 1.0000 | ORAL_TABLET | Freq: Four times a day (QID) | ORAL | Status: DC | PRN

## 2021-12-11 MED ORDER — LABETALOL HCL 5 MG/ML IV SOLN: 10.0000 mg | INTRAVENOUS | Status: AC | PRN

## 2021-12-11 MED ORDER — SODIUM CHLORIDE 0.9 % IV SOLN: INTRAVENOUS | Status: AC

## 2021-12-11 MED ORDER — LIDOCAINE HCL (PF) 1 % IJ SOLN
INTRAMUSCULAR | Status: AC
  Filled 2021-12-11: qty 30

## 2021-12-11 MED ORDER — IOHEXOL 350 MG/ML SOLN
INTRAVENOUS | Status: DC | PRN
  Administered 2021-12-11: 65 mL

## 2021-12-11 MED ORDER — ATORVASTATIN CALCIUM 80 MG PO TABS: 80.0000 mg | ORAL_TABLET | Freq: Every day | ORAL | Status: DC

## 2021-12-11 MED ORDER — INSULIN ASPART 100 UNIT/ML IJ SOLN: 0.0000 [IU] | Freq: Three times a day (TID) | INTRAMUSCULAR | Status: DC

## 2021-12-11 MED ORDER — DIAZEPAM 5 MG PO TABS: 5.0000 mg | ORAL_TABLET | Freq: Four times a day (QID) | ORAL | Status: DC | PRN

## 2021-12-11 MED ORDER — HEPARIN (PORCINE) 25000 UT/250ML-% IV SOLN
1400.0000 [IU]/h | INTRAVENOUS | Status: DC
  Administered 2021-12-11: 1300 [IU]/h via INTRAVENOUS
  Administered 2021-12-12: 1400 [IU]/h via INTRAVENOUS
  Filled 2021-12-11 (×2): qty 250

## 2021-12-11 MED ORDER — VERAPAMIL HCL 2.5 MG/ML IV SOLN
INTRAVENOUS | Status: DC | PRN
  Administered 2021-12-11: 10 mL via INTRA_ARTERIAL

## 2021-12-11 MED ORDER — VERAPAMIL HCL 2.5 MG/ML IV SOLN
INTRAVENOUS | Status: AC
  Filled 2021-12-11: qty 2

## 2021-12-11 MED ORDER — HEPARIN (PORCINE) IN NACL 1000-0.9 UT/500ML-% IV SOLN
INTRAVENOUS | Status: DC | PRN
  Administered 2021-12-11 (×2): 500 mL

## 2021-12-11 MED ORDER — HEPARIN SODIUM (PORCINE) 1000 UNIT/ML IJ SOLN
INTRAMUSCULAR | Status: AC
  Filled 2021-12-11: qty 10

## 2021-12-11 MED ORDER — MIDAZOLAM HCL 2 MG/2ML IJ SOLN
INTRAMUSCULAR | Status: AC
  Filled 2021-12-11: qty 2

## 2021-12-11 MED ORDER — ONDANSETRON HCL 4 MG/2ML IJ SOLN: 4.0000 mg | Freq: Four times a day (QID) | INTRAMUSCULAR | Status: DC | PRN

## 2021-12-11 MED ORDER — FENTANYL CITRATE (PF) 100 MCG/2ML IJ SOLN
INTRAMUSCULAR | Status: AC
  Filled 2021-12-11: qty 2

## 2021-12-11 MED ORDER — SODIUM CHLORIDE 0.9 % IV SOLN: 250.0000 mL | INTRAVENOUS | Status: DC | PRN

## 2021-12-11 MED ORDER — ATORVASTATIN CALCIUM 80 MG PO TABS
80.0000 mg | ORAL_TABLET | Freq: Every day | ORAL | Status: DC
  Administered 2021-12-11 – 2021-12-20 (×9): 80 mg via ORAL
  Filled 2021-12-11 (×8): qty 1

## 2021-12-11 MED ORDER — SODIUM CHLORIDE 0.9% FLUSH: 3.0000 mL | INTRAVENOUS | Status: DC | PRN

## 2021-12-11 MED ORDER — HEPARIN SODIUM (PORCINE) 1000 UNIT/ML IJ SOLN
INTRAMUSCULAR | Status: DC | PRN
  Administered 2021-12-11: 5500 [IU] via INTRAVENOUS

## 2021-12-11 MED ORDER — SODIUM CHLORIDE 0.9 % IV SOLN: INTRAVENOUS | Status: DC

## 2021-12-11 MED ORDER — SODIUM CHLORIDE 0.9% FLUSH
3.0000 mL | Freq: Two times a day (BID) | INTRAVENOUS | Status: DC
  Administered 2021-12-11 – 2021-12-12 (×2): 3 mL via INTRAVENOUS

## 2021-12-11 MED ORDER — ACETAMINOPHEN 325 MG PO TABS: 650.0000 mg | ORAL_TABLET | ORAL | Status: DC | PRN

## 2021-12-11 MED ORDER — LIDOCAINE HCL (PF) 1 % IJ SOLN
INTRAMUSCULAR | Status: DC | PRN
  Administered 2021-12-11: 2 mL

## 2021-12-11 SURGICAL SUPPLY — 12 items
CATH INFINITI 5 FR JL3.5 (CATHETERS) ×1 IMPLANT
CATH INFINITI JR4 5F (CATHETERS) ×1 IMPLANT
CATH OPTITORQUE TIG 4.0 5F (CATHETERS) ×1 IMPLANT
DEVICE RAD COMP TR BAND LRG (VASCULAR PRODUCTS) ×1 IMPLANT
GLIDESHEATH SLEND SS 6F .021 (SHEATH) ×1 IMPLANT
GUIDEWIRE INQWIRE 1.5J.035X260 (WIRE) IMPLANT
INQWIRE 1.5J .035X260CM (WIRE) ×2
KIT HEART LEFT (KITS) ×2 IMPLANT
PACK CARDIAC CATHETERIZATION (CUSTOM PROCEDURE TRAY) ×2 IMPLANT
SHEATH PROBE COVER 6X72 (BAG) ×1 IMPLANT
TRANSDUCER W/STOPCOCK (MISCELLANEOUS) ×2 IMPLANT
TUBING CIL FLEX 10 FLL-RA (TUBING) ×2 IMPLANT

## 2021-12-11 NOTE — Interval H&P Note (Signed)
Cath Lab Visit (complete for each Cath Lab visit)  Clinical Evaluation Leading to the Procedure:   ACS: Yes.    Non-ACS:    Anginal Classification: CCS IV  Anti-ischemic medical therapy: Minimal Therapy (1 class of medications)  Non-Invasive Test Results: No non-invasive testing performed  Prior CABG: No previous CABG      History and Physical Interval Note:  12/11/2021 11:17 AM  Katherine Pratt  has presented today for surgery, with the diagnosis of NSTEMI.  The various methods of treatment have been discussed with the patient and family. After consideration of risks, benefits and other options for treatment, the patient has consented to  Procedure(s): LEFT HEART CATH AND CORONARY ANGIOGRAPHY (N/A) as a surgical intervention.  The patient's history has been reviewed, patient examined, no change in status, stable for surgery.  I have reviewed the patient's chart and labs.  Questions were answered to the patient's satisfaction.     Shelva Majestic

## 2021-12-11 NOTE — Progress Notes (Signed)
Mobility Specialist Progress Note:   12/11/21 1458  Mobility  Activity Ambulated in hall  Level of Assistance Standby assist, set-up cues, supervision of patient - no hands on  Assistive Device  (IV pole)  Distance Ambulated (ft) 230 ft  Mobility Ambulated with assistance in hallway  Mobility Response Tolerated well  Mobility performed by Mobility specialist  $Mobility charge 1 Mobility   Pt received in bed willing to participate in mobility. No complaints of pain. Returned to bed with call bell in reach and all needs met.   [facility] Public librarian Phone [phone] Secondary Phone [phone]

## 2021-12-11 NOTE — TOC Progression Note (Signed)
Transition of Care [facility]) - Progression Note    Patient Details  Name: Katherine Pratt MRN: [id number] Date of Birth: 1951/08/09  Transition of Care [facility]) CM/SW Contact  Zenon Mayo, RN Phone Number: 12/11/2021, 10:29 PM  Clinical Narrative:     Transition of Care [facility]) Screening Note   Patient Details  Name: Katherine Pratt Date of Birth: 04/27/1951   Transition of Care [facility]) CM/SW Contact:    Zenon Mayo, RN Phone Number: 12/11/2021, 10:29 PM    Transition of Care Department [facility]) has reviewed patient and no TOC needs have been identified at this time. We will continue to monitor patient advancement through interdisciplinary progression rounds. If new patient transition needs arise, please place a TOC consult.          Expected Discharge Plan and Services                                                 Social Determinants of Health (SDOH) Interventions    Readmission Risk Interventions No flowsheet data found.

## 2021-12-11 NOTE — Progress Notes (Signed)
°  Echocardiogram 2D Echocardiogram has been performed.  Katherine Pratt 12/11/2021, 3:53 PM

## 2021-12-11 NOTE — H&P (View-Only) (Signed)
Progress Note  Patient Name: Katherine Pratt Date of Encounter: 12/11/2021  [facility] Cardiologist: None   Subjective   Mild chest discomfort early morning, nothing currently.  Inpatient Medications    Scheduled Meds:  amLODipine  10 mg Oral Daily   aspirin EC  81 mg Oral Daily   atorvastatin  40 mg Oral Daily   sodium chloride flush  3 mL Intravenous Q12H   sodium chloride flush  3 mL Intravenous Q12H   Continuous Infusions:  sodium chloride     sodium chloride     sodium chloride 1 mL/kg/hr (12/11/21 0557)   heparin 1,300 Units/hr (12/11/21 0726)   PRN Meds: sodium chloride, sodium chloride, acetaminophen, ALPRAZolam, alum & mag hydroxide-simeth, nitroGLYCERIN, ondansetron (ZOFRAN) IV, polyvinyl alcohol, sodium chloride flush, sodium chloride flush, zolpidem   Vital Signs    Vitals:   12/10/21 1928 12/11/21 0444 12/11/21 0500 12/11/21 0843  BP: 140/66  139/71 138/64  Pulse: 64  65 66  Resp: 18  20 18   Temp: 98.5 F (36.9 C)  98.6 F (37 C) 98.1 F (36.7 C)  TempSrc: Oral  Oral Oral  SpO2: 98%  98% 97%  Weight:  108.4 kg    Height:        Intake/Output Summary (Last 24 hours) at 12/11/2021 0917 Last data filed at 12/11/2021 0400 Gross per 24 hour  Intake 1046.02 ml  Output 1300 ml  Net -253.98 ml   Last 3 Weights 12/11/2021 12/09/2021 07/18/2021  Weight (lbs) 238 lb 14.4 oz 245 lb 249 lb  Weight (kg) 108.364 kg 111.131 kg 112.946 kg      Telemetry    SB - Personally Reviewed  ECG    No new tracing this morning  Physical Exam   GEN: No acute distress.   Neck: No JVD Cardiac: RRR, no murmurs, rubs, or gallops.  Respiratory: Clear to auscultation bilaterally. GI: Soft, nontender, non-distended  MS: No edema; No deformity. Neuro:  Nonfocal  Psych: Normal affect   Labs    High Sensitivity Troponin:   Recent Labs  Lab 12/09/21 1936 12/09/21 2148 12/10/21 0535 12/10/21 1407 12/10/21 1611  TROPONINIHS 288* 285* 226* 130* 126*      Chemistry Recent Labs  Lab 12/09/21 1936 12/11/21 0052  NA 138 138  K 3.5 3.7  CL 104 106  CO2 24 22  GLUCOSE 130* 169*  BUN 13 11  CREATININE 0.98 0.88  CALCIUM 8.8* 8.3*  PROT 7.1 5.8*  ALBUMIN 3.5 2.9*  AST 43* 25  ALT 57* 39  ALKPHOS 114 106  BILITOT 0.4 0.6  GFRNONAA >60 >60  ANIONGAP 10 10    Lipids  Recent Labs  Lab 12/10/21 1407  CHOL 154  TRIG 96  HDL 32*  LDLCALC 103*  CHOLHDL 4.8    Hematology Recent Labs  Lab 12/09/21 1936 12/11/21 0052  WBC 5.0 5.8  RBC 4.50 4.10  HGB 14.4 13.0  HCT 41.3 38.5  MCV 91.8 93.9  MCH 32.0 31.7  MCHC 34.9 33.8  RDW 12.6 12.4  PLT 283 256   Thyroid  Recent Labs  Lab 12/10/21 1407  TSH 1.489    BNP Recent Labs  Lab 12/09/21 1936  BNP 123.5*    DDimer  Recent Labs  Lab 12/09/21 1936  DDIMER 0.63*     Radiology    DG Chest 2 View  Result Date: 12/09/2021 CLINICAL DATA:  Central chest pain since yesterday EXAM: CHEST - 2 VIEW COMPARISON:  06/01/2019 FINDINGS: The  heart size and mediastinal contours are within normal limits. Both lungs are clear. The visualized skeletal structures are unremarkable. IMPRESSION: No active cardiopulmonary disease. Electronically Signed   By: Randa Ngo M.D.   On: 12/09/2021 19:51   CT Angio Chest PE W and/or Wo Contrast  Result Date: 12/09/2021 CLINICAL DATA:  Pulmonary embolism (PE) suspected, positive D-dimer EXAM: CT ANGIOGRAPHY CHEST WITH CONTRAST TECHNIQUE: Multidetector CT imaging of the chest was performed using the standard protocol during bolus administration of intravenous contrast. Multiplanar CT image reconstructions and MIPs were obtained to evaluate the vascular anatomy. CONTRAST:  126mL OMNIPAQUE IOHEXOL 350 MG/ML SOLN COMPARISON:  None. FINDINGS: Cardiovascular: Satisfactory opacification of the pulmonary arteries to the segmental level. No evidence of pulmonary embolism. Normal heart size. No significant pericardial effusion. The thoracic aorta is normal in  caliber. Mild atherosclerotic plaque of the thoracic aorta. Coronary artery calcifications. Mediastinum/Nodes: Borderline enlarged 1 cm left axillary lymph node. Otherwise asymmetrically prominent but nonenlarged left axillary lymph nodes. No enlarged mediastinal, hilar, or axillary lymph nodes. Thyroid gland, trachea, and esophagus demonstrate no significant findings. Lungs/Pleura: No focal consolidation. No pulmonary nodule. No pulmonary mass. No pleural effusion. No pneumothorax. Upper Abdomen: No acute abnormality.  Likely splenule (4:97). Musculoskeletal: No chest wall abnormality. No suspicious lytic or blastic osseous lesions. No acute displaced fracture. Multilevel degenerative changes of the spine. Degenerative changes of the left shoulder. Review of the MIP images confirms the above findings. IMPRESSION: 1. No pulmonary embolus. 2. No acute intrathoracic abnormality. 3. Borderline enlarged 1 cm left axillary lymph node. Otherwise asymmetrically prominent but nonenlarged left axillary lymph nodes. Correlate with recent left-sided COVID vaccine or with diagnostic mammography if no recent left-sided COVID vaccine. 4.  Aortic Atherosclerosis (ICD10-I70.0). Electronically Signed   By: Iven Finn M.D.   On: 12/09/2021 21:16    Cardiac Studies   Echo: pending  Patient Profile     71 y.o. female with HLD, DM2, obesity and HTN with CP and elevated hsT c/w NSTEMI transferred from [facility] for further evaluation at the request of Dr. Laverta Baltimore.   Assessment & Plan    NSTEMI: hsTn peaked at 288. Planned for cardiac cath today. No chest pain overnight -- remains on IV heparin, ASA, statin, norvasc. No BB for now with bradycardia -- echo pending  HTN: stable -- continue on norvasc 10mg  daily   DM: Hgb A1c 7.0 -- SSI while inpatient -- on trulicity PTA (reports her PCP recently stopped her metformin in the setting of GI side effects)  HLD: LDL 103 -- atorvastatin 40mg  daily, may increase pending cath  results  For questions or updates, please contact [place] Please consult [email or web address] for contact info under        Signed, Reino Bellis, NP  12/11/2021, 9:17 AM    I have personally seen and examined this patient. I agree with the assessment and plan as outlined above.  Chest pain overnight. Troponin peak 288.  Cardiac cath today.  Risks and benefits reviewed with pt.   Lauree Chandler 12/11/2021 10:26 AM

## 2021-12-11 NOTE — Progress Notes (Addendum)
[place] for heparin Indication: chest pain/ACS  Allergies  Allergen Reactions   Aspirin Other (See Comments)    On meloxicam   Keflex [Cephalexin] Hives   Losartan Rash    Patient Measurements: Height: 5\' 5"  (165.1 cm) Weight: 108.4 kg (238 lb 14.4 oz) IBW/kg (Calculated) : 57 Heparin Dosing Weight: 83.2kg  Vital Signs: Temp: 98.6 F (37 C) (01/04 0500) Temp Source: Oral (01/04 0500) BP: 139/71 (01/04 0500) Pulse Rate: 65 (01/04 0500)  Labs: Recent Labs    12/09/21 1936 12/09/21 2148 12/10/21 0535 12/10/21 0623 12/10/21 1407 12/10/21 1611 12/11/21 0052  HGB 14.4  --   --   --   --   --  13.0  HCT 41.3  --   --   --   --   --  38.5  PLT 283  --   --   --   --   --  256  HEPARINUNFRC  --   --   --  0.47  --   --  0.23*  CREATININE 0.98  --   --   --   --   --  0.88  TROPONINIHS 288*   < > 226*  --  130* 126*  --    < > = values in this interval not displayed.     Estimated Creatinine Clearance: 71.8 mL/min (by C-G formula based on SCr of 0.88 mg/dL).  Assessment: 71 y.o. female with chest pain on heparin. Plans for cath today -Heparin level= 0.23 on 1100 units/hr  Goal of Therapy:  Heparin level 0.3-0.7 units/ml Monitor platelets by anticoagulation protocol: Yes   Plan:  -Increase heparin to 1300 unit/hr -Will follow plans post cath  Hildred Laser, PharmD Clinical Pharmacist **Pharmacist phone directory can now be found on [email or web address] ([email or web address]).  Listed under [place].  Addendum -s/p cath for CABG assessment -restart heparin 1300 units at 8pm (8 hrs post sheath removal) -Daily HL/CBC  Hildred Laser, PharmD Clinical Pharmacist **Pharmacist phone directory can now be found on [email or web address] ([email or web address]).  Listed under [place].  e

## 2021-12-11 NOTE — Consult Note (Addendum)
[address]       [place] [address]             [phone]        Jullisa Langelier [place] Medical Record #[id number] Date of Birth: 02/16/1951  Referring: Dr. Claiborne Billings Primary Care: Belva Bertin, [place], [place] Primary Cardiologist:New to [facility]  Chief Complaint:    Chief Complaint  Patient presents with   Chest Pain  Reason for consultation: Coronary artery disease  History of Present Illness:     This is a 71 year old female with a past medical history of obesity, hyperlipidemia, hypertension, diabetes mellitus, right breast cancer (lumpectomy and radiation) who presented initially to [facility] ED [facility] with complaints of chest pain. She reported intermittent chest pain ("in my heart, not my chest") that was stabbing like and fairly severe on and off throughout the night 12/08/2021.  Per medical record, she sat on the side of the bed and rocked. Her left arm and shoulder hurt and she was sweating a lot. She denied shortness of breath, nausea, vomiting. She took a Pepcid and was able to get some sleep. On 12/09/2021, she took the trash out and had sharp chest pain (not as bad as the night before). She felt weak and thought her legs were going to give out. Of note, she had COVID booster on Thursday 12/29. She called a friend to take her to the [facility] ED. She ruled in for a NSTEMI. She was put on a Heparin drip. She was transferred to [facility] for further evaluation and treatment. She underwent a cardiac catheterization on 01/04. Results showed LVEF 50-55% and severe multivessel CAD (please see specific results below). At the time of my exam, she is eating lunch. She denies chest pain or shortness of breath.  Current Activity/ Functional Status: Patient is independent with mobility/ambulation, transfers, ADL's, IADL's.   Zubrod Score: At the time of surgery this patients most appropriate activity status/level should be described  as: []     0    Normal activity, no symptoms [x]     1    Restricted in physical strenuous activity but ambulatory, able to do out light work []     2    Ambulatory and capable of self care, unable to do work activities, up and about more than 50%  Of the time                            []     3    Only limited self care, in bed greater than 50% of waking hours []     4    Completely disabled, no self care, confined to bed or chair []     5    Moribund  Past Medical History:  Diagnosis Date   Breast cancer ([place])    Diabetes mellitus without complication ([place])    HLD (hyperlipidemia) 08/01/2015   Hypertension     Past Surgical History:  Procedure Laterality Date   ABDOMINAL HYSTERECTOMY     APPENDECTOMY     BREAST LUMPECTOMY and radiation     ELBOW SURGERY     HERNIA REPAIR-umbilical, femoral ?     KNEE SURGERY     LEFT HEART CATH AND CORONARY ANGIOGRAPHY N/A 12/11/2021   Procedure: LEFT HEART CATH AND CORONARY ANGIOGRAPHY;  Surgeon: Troy Sine, MD;  Location: [facility];  Service: Cardiovascular;  Laterality: N/A;    Social History   Tobacco Use  Smoking Status Never  Smokeless Tobacco Never    Social History   Substance and Sexual Activity  Alcohol Use No     Allergies  Allergen Reactions   Aspirin Other (See Comments)    On meloxicam   Keflex [Cephalexin] Hives   Losartan Rash    Current Facility-Administered Medications  Medication Dose Route Frequency Provider Last Rate Last Admin   0.9 %  sodium chloride infusion  250 mL Intravenous PRN Barrett, Rhonda G, PA-C       0.9 %  sodium chloride infusion  250 mL Intravenous PRN Troy Sine, MD       0.9 %  sodium chloride infusion   Intravenous Continuous Skeet Simmer, RPH 125 mL/hr at 12/11/21 1304 New Bag at 12/11/21 1304   Followed by   0.9 %  sodium chloride infusion   Intravenous Continuous Skeet Simmer, RPH       acetaminophen (TYLENOL) tablet 650 mg  650 mg Oral Q4H PRN Troy Sine, MD        ALPRAZolam Duanne Moron) tablet 0.25 mg  0.25 mg Oral BID PRN Barrett, Evelene Croon, PA-C       alum & mag hydroxide-simeth (MAALOX/MYLANTA) 200-200-20 MG/5ML suspension 15 mL  15 mL Oral Q4H PRN Burnell Blanks, MD   15 mL at 12/10/21 2057   amLODipine (NORVASC) tablet 10 mg  10 mg Oral Daily Barrett, Rhonda G, PA-C   10 mg at 12/11/21 0944   aspirin EC tablet 81 mg  81 mg Oral Daily Barrett, Rhonda G, PA-C       atorvastatin (LIPITOR) tablet 80 mg  80 mg Oral Daily Reino Bellis B, NP   80 mg at 12/11/21 0944   diazepam (VALIUM) tablet 5 mg  5 mg Oral Q6H PRN Troy Sine, MD       heparin ADULT infusion 100 units/mL (25000 units/266mL)  1,300 Units/hr Intravenous Continuous Kris Mouton, RPH 13 mL/hr at 12/11/21 0726 1,300 Units/hr at 12/11/21 0726   hydrALAZINE (APRESOLINE) injection 10 mg  10 mg Intravenous Q20 Min PRN Troy Sine, MD       insulin aspart (novoLOG) injection 0-15 Units  0-15 Units Subcutaneous TID WC Reino Bellis B, NP       labetalol (NORMODYNE) injection 10 mg  10 mg Intravenous Q10 min PRN Troy Sine, MD       nitroGLYCERIN (NITROSTAT) SL tablet 0.4 mg  0.4 mg Sublingual Q5 Min x 3 PRN Barrett, Evelene Croon, PA-C       ondansetron (ZOFRAN) injection 4 mg  4 mg Intravenous Q6H PRN Troy Sine, MD       oxyCODONE-acetaminophen (PERCOCET/ROXICET) 5-325 MG per tablet 1-2 tablet  1-2 tablet Oral Q6H PRN Burnell Blanks, MD       polyvinyl alcohol (LIQUIFILM TEARS) 1.4 % ophthalmic solution 1 drop  1 drop Both Eyes PRN Barrett, Rhonda G, PA-C       sodium chloride flush (NS) 0.9 % injection 3 mL  3 mL Intravenous Q12H Barrett, Rhonda G, PA-C   3 mL at 12/10/21 1730   sodium chloride flush (NS) 0.9 % injection 3 mL  3 mL Intravenous PRN Barrett, Rhonda G, PA-C       sodium chloride flush (NS) 0.9 % injection 3 mL  3 mL Intravenous Q12H Troy Sine, MD       sodium chloride flush (NS)  0.9 % injection 3 mL  3 mL Intravenous PRN Troy Sine, MD        zolpidem (AMBIEN) tablet 5 mg  5 mg Oral QHS PRN Barrett, Evelene Croon, PA-C        Medications Prior to Admission  Medication Sig Dispense Refill Last Dose   amLODipine (NORVASC) 10 MG tablet Take 10 mg by mouth daily.   7/0/3500   Aspirin-Salicylamide-Caffeine (BC HEADACHE POWDER PO) Take 1 Package by mouth 2 (two) times daily as needed (headache/pain).   Past Week   atorvastatin (LIPITOR) 40 MG tablet Take 40 mg by mouth daily.   12/08/2021   ibuprofen (ADVIL) 200 MG tablet Take 600-800 mg by mouth every 6 (six) hours as needed for fever, headache or mild pain.   Past Week   polyvinyl alcohol (LIQUIFILM TEARS) 1.4 % ophthalmic solution Place 1 drop into both eyes as needed for dry eyes.   Past Week   TRULICITY 3 XF/8.1WE SOPN Inject 3 mg into the skin once a week.   12/07/2021   ACCU-CHEK GUIDE test strip       Family History: Mother deceased at age 44-had mild heart attack Father decreased around age 39-had heart murmur Patient is widowed. She has a son and a daughter and one grandchild. She lives alone and is able to do her ADLs  Review of Systems:      Cardiac Review of Systems: Y or  [  N  ]= no  Chest Pain [ Y-on admission  ]  Resting SOB [ N  ]    Pedal Edema [ N  ]    Syncope  [  N]     General Review of Systems: [Y] = yes [ N ]=no ConstUtional:nausea Aqua.Slicker  ]; night sweats [ N ]; fever [  N]; or chills [  N]                                                                 Eye :  diplopia [  N ]; Amaurosis fugax[  N]; Resp: cough [ N ];  wheezing[N  ];  hemoptysis[ N ];  GI:  vomiting[ N ];  melena[  N];  hematochezia [ N ];  GU:  hematuria[ N ];                Skin: rash, swelling[N  ];, peripheral edema[ N ];   Musculosketetal:  back pain[ Y ];  Heme/Lymph:   anemia[ N ];  Neuro: TIA[ N ];  headaches[  ];  stroke[ N ];  vertigo[  N];  seizures[ N ];    Endocrine: diabetes[  Y];  thyroid dysfunction[ N ];                 Physical Exam: BP 132/63    Pulse 60    Temp 97.7  F (36.5 C) (Oral)    Resp 16    Ht 5\' 5"  (1.651 m)    Wt 108.4 kg    SpO2 100%    BMI 39.76 kg/m    General appearance: alert, cooperative, and no distress Head: Normocephalic, without obvious abnormality, atraumatic Neck: no carotid bruit, no JVD, and supple, symmetrical, trachea midline Resp: clear to auscultation bilaterally Cardio: RRR, soft  grade I-II/VI murmur GI: Soft, obese, non tender, bowel sounds present Extremities: No LE edema. Palpable DP bilaterally Neurologic: Grossly normal  Diagnostic Studies & Laboratory data:     Recent Radiology Findings:   DG Chest 2 View  Result Date: 12/09/2021 CLINICAL DATA:  Central chest pain since yesterday EXAM: CHEST - 2 VIEW COMPARISON:  06/01/2019 FINDINGS: The heart size and mediastinal contours are within normal limits. Both lungs are clear. The visualized skeletal structures are unremarkable. IMPRESSION: No active cardiopulmonary disease. Electronically Signed   By: Randa Ngo M.D.   On: 12/09/2021 19:51   CT Angio Chest PE W and/or Wo Contrast  Result Date: 12/09/2021 CLINICAL DATA:  Pulmonary embolism (PE) suspected, positive D-dimer EXAM: CT ANGIOGRAPHY CHEST WITH CONTRAST TECHNIQUE: Multidetector CT imaging of the chest was performed using the standard protocol during bolus administration of intravenous contrast. Multiplanar CT image reconstructions and MIPs were obtained to evaluate the vascular anatomy. CONTRAST:  135mL OMNIPAQUE IOHEXOL 350 MG/ML SOLN COMPARISON:  None. FINDINGS: Cardiovascular: Satisfactory opacification of the pulmonary arteries to the segmental level. No evidence of pulmonary embolism. Normal heart size. No significant pericardial effusion. The thoracic aorta is normal in caliber. Mild atherosclerotic plaque of the thoracic aorta. Coronary artery calcifications. Mediastinum/Nodes: Borderline enlarged 1 cm left axillary lymph node. Otherwise asymmetrically prominent but nonenlarged left axillary lymph nodes. No  enlarged mediastinal, hilar, or axillary lymph nodes. Thyroid gland, trachea, and esophagus demonstrate no significant findings. Lungs/Pleura: No focal consolidation. No pulmonary nodule. No pulmonary mass. No pleural effusion. No pneumothorax. Upper Abdomen: No acute abnormality.  Likely splenule (4:97). Musculoskeletal: No chest wall abnormality. No suspicious lytic or blastic osseous lesions. No acute displaced fracture. Multilevel degenerative changes of the spine. Degenerative changes of the left shoulder. Review of the MIP images confirms the above findings. IMPRESSION: 1. No pulmonary embolus. 2. No acute intrathoracic abnormality. 3. Borderline enlarged 1 cm left axillary lymph node. Otherwise asymmetrically prominent but nonenlarged left axillary lymph nodes. Correlate with recent left-sided COVID vaccine or with diagnostic mammography if no recent left-sided COVID vaccine. 4.  Aortic Atherosclerosis (ICD10-I70.0). Electronically Signed   By: Iven Finn M.D.   On: 12/09/2021 21:16   CARDIAC CATHETERIZATION  Result Date: 12/11/2021   Prox RCA lesion is 95% stenosed.   Mid RCA lesion is 70% stenosed.   Dist RCA lesion is 50% stenosed.   1st RPL lesion is 80% stenosed.   1st Mrg-1 lesion is 90% stenosed.   1st Mrg-2 lesion is 80% stenosed.   3rd Mrg lesion is 95% stenosed.   Mid LAD-1 lesion is 90% stenosed.   Prox LAD to Mid LAD lesion is 50% stenosed.   Mid LAD-2 lesion is 30% stenosed.   Dist LAD-2 lesion is 40% stenosed.   Dist LAD-1 lesion is 50% stenosed.   The left ventricular systolic function is normal.   LV end diastolic pressure is normal.   The left ventricular ejection fraction is 50-55% by visual estimate. Severe multivessel CAD with coronary calcification and high-grade stenoses in all 3 major coronary arteries. Preserved global LV contractility with EF estimate at 55% with subtle mid anterolateral hypocontractility.  LVEDP 15 mmHg. RECOMMENDATION: Surgical consultation for consideration  of CABG revascularization.  Increase anti-ischemic medical regimen.  Aggressive lipid-lowering therapy.      Coronary Diagrams  Diagnostic Dominance: Right Intervention  I have independently reviewed the above radiologic studies and discussed with the patient   Recent Lab Findings: Lab Results  Component Value Date   WBC  5.8 12/11/2021   HGB 13.0 12/11/2021   HCT 38.5 12/11/2021   PLT 256 12/11/2021   GLUCOSE 169 (H) 12/11/2021   CHOL 154 12/10/2021   TRIG 96 12/10/2021   HDL 32 (L) 12/10/2021   LDLCALC 103 (H) 12/10/2021   ALT 39 12/11/2021   AST 25 12/11/2021   NA 138 12/11/2021   K 3.7 12/11/2021   CL 106 12/11/2021   CREATININE 0.88 12/11/2021   BUN 11 12/11/2021   CO2 22 12/11/2021   TSH 1.489 12/10/2021   HGBA1C 7.0 (H) 12/10/2021   Assessment / Plan:   S/p NSTEMI, coronary artery disease-on Heparin drip. Echo ordered. Dr. Cyndia Bent to review cardiac catheterization films and echo and provide recommendation. History of hypertension-on Amlodipine 10 mg daily prior to admission History of hyperlipidemia-on Atorvastatin 40 mg daily prior to admission History of diabetes mellitus-on Trulicity prior to admission.. Pre op HGA1C 7. 5. History of right breast cancer-(s/p lumpectomy and radiation at age 68). CT angio done 01/02 showed borderline enlarged 1 cm left axillary node;patient can be followed up by medical doctor    I  spent 20 minutes counseling the patient face to face.   Lars Pinks PA-C 12/11/2021 1:17 PM    Chart reviewed, patient examined, agree with above.  This 71 year old woman presented with recent onset of substernal chest pain with exertion and ruled in for an NSTEMI.  Cardiac catheterization shows severe three-vessel coronary disease with significant proximal coronary calcification and high-grade stenoses in all 3 coronary territories.  Left ventricular ejection fraction is 55 to 60%.  There is trivial mitral regurgitation and no aortic  stenosis or insufficiency.  She has a history of breast cancer with right breast lumpectomy and radiation therapy at age 20.  I agree that with diabetes and severe three-vessel coronary disease with significant coronary calcification that coronary bypass graft surgery is the best treatment for her. I discussed the operative procedure with the patient and her sister and brother-in-law including alternatives, benefits and risks; including but not limited to bleeding, blood transfusion, infection, stroke, myocardial infarction, graft failure, heart block requiring a permanent pacemaker, organ dysfunction, and death.  Arlyce Dice understands and agrees to proceed.  We will schedule surgery for Friday am.

## 2021-12-11 NOTE — Progress Notes (Addendum)
Progress Note  Patient Name: Katherine Pratt Date of Encounter: 12/11/2021  [facility] Cardiologist: None   Subjective   Mild chest discomfort early morning, nothing currently.  Inpatient Medications    Scheduled Meds:  amLODipine  10 mg Oral Daily   aspirin EC  81 mg Oral Daily   atorvastatin  40 mg Oral Daily   sodium chloride flush  3 mL Intravenous Q12H   sodium chloride flush  3 mL Intravenous Q12H   Continuous Infusions:  sodium chloride     sodium chloride     sodium chloride 1 mL/kg/hr (12/11/21 0557)   heparin 1,300 Units/hr (12/11/21 0726)   PRN Meds: sodium chloride, sodium chloride, acetaminophen, ALPRAZolam, alum & mag hydroxide-simeth, nitroGLYCERIN, ondansetron (ZOFRAN) IV, polyvinyl alcohol, sodium chloride flush, sodium chloride flush, zolpidem   Vital Signs    Vitals:   12/10/21 1928 12/11/21 0444 12/11/21 0500 12/11/21 0843  BP: 140/66  139/71 138/64  Pulse: 64  65 66  Resp: 18  20 18   Temp: 98.5 F (36.9 C)  98.6 F (37 C) 98.1 F (36.7 C)  TempSrc: Oral  Oral Oral  SpO2: 98%  98% 97%  Weight:  108.4 kg    Height:        Intake/Output Summary (Last 24 hours) at 12/11/2021 0917 Last data filed at 12/11/2021 0400 Gross per 24 hour  Intake 1046.02 ml  Output 1300 ml  Net -253.98 ml   Last 3 Weights 12/11/2021 12/09/2021 07/18/2021  Weight (lbs) 238 lb 14.4 oz 245 lb 249 lb  Weight (kg) 108.364 kg 111.131 kg 112.946 kg      Telemetry    SB - Personally Reviewed  ECG    No new tracing this morning  Physical Exam   GEN: No acute distress.   Neck: No JVD Cardiac: RRR, no murmurs, rubs, or gallops.  Respiratory: Clear to auscultation bilaterally. GI: Soft, nontender, non-distended  MS: No edema; No deformity. Neuro:  Nonfocal  Psych: Normal affect   Labs    High Sensitivity Troponin:   Recent Labs  Lab 12/09/21 1936 12/09/21 2148 12/10/21 0535 12/10/21 1407 12/10/21 1611  TROPONINIHS 288* 285* 226* 130* 126*      Chemistry Recent Labs  Lab 12/09/21 1936 12/11/21 0052  NA 138 138  K 3.5 3.7  CL 104 106  CO2 24 22  GLUCOSE 130* 169*  BUN 13 11  CREATININE 0.98 0.88  CALCIUM 8.8* 8.3*  PROT 7.1 5.8*  ALBUMIN 3.5 2.9*  AST 43* 25  ALT 57* 39  ALKPHOS 114 106  BILITOT 0.4 0.6  GFRNONAA >60 >60  ANIONGAP 10 10    Lipids  Recent Labs  Lab 12/10/21 1407  CHOL 154  TRIG 96  HDL 32*  LDLCALC 103*  CHOLHDL 4.8    Hematology Recent Labs  Lab 12/09/21 1936 12/11/21 0052  WBC 5.0 5.8  RBC 4.50 4.10  HGB 14.4 13.0  HCT 41.3 38.5  MCV 91.8 93.9  MCH 32.0 31.7  MCHC 34.9 33.8  RDW 12.6 12.4  PLT 283 256   Thyroid  Recent Labs  Lab 12/10/21 1407  TSH 1.489    BNP Recent Labs  Lab 12/09/21 1936  BNP 123.5*    DDimer  Recent Labs  Lab 12/09/21 1936  DDIMER 0.63*     Radiology    DG Chest 2 View  Result Date: 12/09/2021 CLINICAL DATA:  Central chest pain since yesterday EXAM: CHEST - 2 VIEW COMPARISON:  06/01/2019 FINDINGS: The  heart size and mediastinal contours are within normal limits. Both lungs are clear. The visualized skeletal structures are unremarkable. IMPRESSION: No active cardiopulmonary disease. Electronically Signed   By: Randa Ngo M.D.   On: 12/09/2021 19:51   CT Angio Chest PE W and/or Wo Contrast  Result Date: 12/09/2021 CLINICAL DATA:  Pulmonary embolism (PE) suspected, positive D-dimer EXAM: CT ANGIOGRAPHY CHEST WITH CONTRAST TECHNIQUE: Multidetector CT imaging of the chest was performed using the standard protocol during bolus administration of intravenous contrast. Multiplanar CT image reconstructions and MIPs were obtained to evaluate the vascular anatomy. CONTRAST:  113mL OMNIPAQUE IOHEXOL 350 MG/ML SOLN COMPARISON:  None. FINDINGS: Cardiovascular: Satisfactory opacification of the pulmonary arteries to the segmental level. No evidence of pulmonary embolism. Normal heart size. No significant pericardial effusion. The thoracic aorta is normal in  caliber. Mild atherosclerotic plaque of the thoracic aorta. Coronary artery calcifications. Mediastinum/Nodes: Borderline enlarged 1 cm left axillary lymph node. Otherwise asymmetrically prominent but nonenlarged left axillary lymph nodes. No enlarged mediastinal, hilar, or axillary lymph nodes. Thyroid gland, trachea, and esophagus demonstrate no significant findings. Lungs/Pleura: No focal consolidation. No pulmonary nodule. No pulmonary mass. No pleural effusion. No pneumothorax. Upper Abdomen: No acute abnormality.  Likely splenule (4:97). Musculoskeletal: No chest wall abnormality. No suspicious lytic or blastic osseous lesions. No acute displaced fracture. Multilevel degenerative changes of the spine. Degenerative changes of the left shoulder. Review of the MIP images confirms the above findings. IMPRESSION: 1. No pulmonary embolus. 2. No acute intrathoracic abnormality. 3. Borderline enlarged 1 cm left axillary lymph node. Otherwise asymmetrically prominent but nonenlarged left axillary lymph nodes. Correlate with recent left-sided COVID vaccine or with diagnostic mammography if no recent left-sided COVID vaccine. 4.  Aortic Atherosclerosis (ICD10-I70.0). Electronically Signed   By: Iven Finn M.D.   On: 12/09/2021 21:16    Cardiac Studies   Echo: pending  Patient Profile     71 y.o. female with HLD, DM2, obesity and HTN with CP and elevated hsT c/w NSTEMI transferred from [facility] for further evaluation at the request of Dr. Laverta Baltimore.   Assessment & Plan    NSTEMI: hsTn peaked at 288. Planned for cardiac cath today. No chest pain overnight -- remains on IV heparin, ASA, statin, norvasc. No BB for now with bradycardia -- echo pending  HTN: stable -- continue on norvasc 10mg  daily   DM: Hgb A1c 7.0 -- SSI while inpatient -- on trulicity PTA (reports her PCP recently stopped her metformin in the setting of GI side effects)  HLD: LDL 103 -- atorvastatin 40mg  daily, may increase pending cath  results  For questions or updates, please contact [place] Please consult [email or web address] for contact info under        Signed, Reino Bellis, NP  12/11/2021, 9:17 AM    I have personally seen and examined this patient. I agree with the assessment and plan as outlined above.  Chest pain overnight. Troponin peak 288.  Cardiac cath today.  Risks and benefits reviewed with pt.   Lauree Chandler 12/11/2021 10:26 AM

## 2021-12-11 NOTE — Progress Notes (Signed)
TCTS consulted for CABG evaluation. °

## 2021-12-12 ENCOUNTER — Other Ambulatory Visit (HOSPITAL_COMMUNITY): Payer: Self-pay

## 2021-12-12 ENCOUNTER — Inpatient Hospital Stay (HOSPITAL_COMMUNITY): Payer: Medicare HMO

## 2021-12-12 DIAGNOSIS — I2510 Atherosclerotic heart disease of native coronary artery without angina pectoris: Secondary | ICD-10-CM

## 2021-12-12 DIAGNOSIS — Z01810 Encounter for preprocedural cardiovascular examination: Secondary | ICD-10-CM

## 2021-12-12 DIAGNOSIS — I214 Non-ST elevation (NSTEMI) myocardial infarction: Secondary | ICD-10-CM | POA: Diagnosis not present

## 2021-12-12 LAB — CBC
HCT: 40.5 % (ref 36.0–46.0)
Hemoglobin: 13.7 g/dL (ref 12.0–15.0)
MCH: 31.8 pg (ref 26.0–34.0)
MCHC: 33.8 g/dL (ref 30.0–36.0)
MCV: 94 fL (ref 80.0–100.0)
Platelets: 247 10*3/uL (ref 150–400)
RBC: 4.31 MIL/uL (ref 3.87–5.11)
RDW: 12.5 % (ref 11.5–15.5)
WBC: 6.9 10*3/uL (ref 4.0–10.5)
nRBC: 0 % (ref 0.0–0.2)

## 2021-12-12 LAB — GLUCOSE, CAPILLARY
Glucose-Capillary: 118 mg/dL — ABNORMAL HIGH (ref 70–99)
Glucose-Capillary: 177 mg/dL — ABNORMAL HIGH (ref 70–99)
Glucose-Capillary: 120 mg/dL — ABNORMAL HIGH (ref 70–99)
Glucose-Capillary: 146 mg/dL — ABNORMAL HIGH (ref 70–99)

## 2021-12-12 LAB — BASIC METABOLIC PANEL
Anion gap: 5 (ref 5–15)
BUN: 9 mg/dL (ref 8–23)
CO2: 22 mmol/L (ref 22–32)
Calcium: 8.4 mg/dL — ABNORMAL LOW (ref 8.9–10.3)
Chloride: 108 mmol/L (ref 98–111)
Creatinine, Ser: 0.89 mg/dL (ref 0.44–1.00)
GFR, Estimated: >60 mL/min (ref >60)
Glucose, Bld: 141 mg/dL — ABNORMAL HIGH (ref 70–99)
Potassium: 3.4 mmol/L — ABNORMAL LOW (ref 3.5–5.1)
Sodium: 135 mmol/L (ref 135–145)

## 2021-12-12 LAB — TYPE AND SCREEN
ABO/RH(D): A POS
Antibody Screen: NEGATIVE

## 2021-12-12 LAB — HEPARIN LEVEL (UNFRACTIONATED)
Heparin Unfractionated: 0.28 IU/mL — ABNORMAL LOW (ref 0.30–0.70)
Heparin Unfractionated: 0.52 IU/mL (ref 0.30–0.70)

## 2021-12-12 LAB — ABO/RH: ABO/RH(D): A POS

## 2021-12-12 MED ORDER — CHLORHEXIDINE GLUCONATE CLOTH 2 % EX PADS: 6.0000 | MEDICATED_PAD | Freq: Once | CUTANEOUS | Status: DC

## 2021-12-12 MED ORDER — METOPROLOL TARTRATE 12.5 MG HALF TABLET
12.5000 mg | ORAL_TABLET | Freq: Once | ORAL | Status: AC
  Administered 2021-12-13: 12.5 mg via ORAL
  Filled 2021-12-12: qty 1

## 2021-12-12 MED ORDER — POTASSIUM CHLORIDE 2 MEQ/ML IV SOLN
80.0000 meq | INTRAVENOUS | Status: DC
  Filled 2021-12-12: qty 40

## 2021-12-12 MED ORDER — PLASMA-LYTE A IV SOLN
INTRAVENOUS | Status: DC
  Filled 2021-12-12: qty 5

## 2021-12-12 MED ORDER — POTASSIUM CHLORIDE CRYS ER 20 MEQ PO TBCR
60.0000 meq | EXTENDED_RELEASE_TABLET | Freq: Once | ORAL | Status: AC
  Administered 2021-12-12: 60 meq via ORAL
  Filled 2021-12-12: qty 3

## 2021-12-12 MED ORDER — CHLORHEXIDINE GLUCONATE CLOTH 2 % EX PADS
6.0000 | MEDICATED_PAD | Freq: Once | CUTANEOUS | Status: AC
  Administered 2021-12-13: 6 via TOPICAL

## 2021-12-12 MED ORDER — TEMAZEPAM 15 MG PO CAPS: 15.0000 mg | ORAL_CAPSULE | Freq: Once | ORAL | Status: DC | PRN

## 2021-12-12 MED ORDER — NOREPINEPHRINE 4 MG/250ML-% IV SOLN
0.0000 ug/min | INTRAVENOUS | Status: DC
  Filled 2021-12-12: qty 250

## 2021-12-12 MED ORDER — HEPARIN 30,000 UNITS/1000 ML (OHS) CELLSAVER SOLUTION
Status: DC
  Filled 2021-12-12: qty 1000

## 2021-12-12 MED ORDER — MANNITOL 20 % IV SOLN
INTRAVENOUS | Status: DC
  Filled 2021-12-12: qty 13

## 2021-12-12 MED ORDER — MILRINONE LACTATE IN DEXTROSE 20-5 MG/100ML-% IV SOLN
0.3000 ug/kg/min | INTRAVENOUS | Status: DC
  Filled 2021-12-12: qty 100

## 2021-12-12 MED ORDER — BISACODYL 5 MG PO TBEC
5.0000 mg | DELAYED_RELEASE_TABLET | Freq: Once | ORAL | Status: AC
  Administered 2021-12-12: 5 mg via ORAL
  Filled 2021-12-12: qty 1

## 2021-12-12 MED ORDER — TRANEXAMIC ACID (OHS) PUMP PRIME SOLUTION
2.0000 mg/kg | INTRAVENOUS | Status: DC
  Filled 2021-12-12: qty 2.17

## 2021-12-12 MED ORDER — DIAZEPAM 5 MG PO TABS
5.0000 mg | ORAL_TABLET | Freq: Once | ORAL | Status: AC
  Administered 2021-12-13: 5 mg via ORAL
  Filled 2021-12-12: qty 1

## 2021-12-12 MED ORDER — CHLORHEXIDINE GLUCONATE CLOTH 2 % EX PADS
6.0000 | MEDICATED_PAD | Freq: Once | CUTANEOUS | Status: AC
  Administered 2021-12-12: 6 via TOPICAL

## 2021-12-12 MED ORDER — TRANEXAMIC ACID (OHS) BOLUS VIA INFUSION
15.0000 mg/kg | INTRAVENOUS | Status: AC
  Administered 2021-12-13: 1162.5 mg via INTRAVENOUS
  Filled 2021-12-12: qty 1163

## 2021-12-12 MED ORDER — CHLORHEXIDINE GLUCONATE 0.12 % MT SOLN
15.0000 mL | Freq: Once | OROMUCOSAL | Status: AC
  Administered 2021-12-13: 15 mL via OROMUCOSAL
  Filled 2021-12-12: qty 15

## 2021-12-12 MED ORDER — NITROGLYCERIN IN D5W 200-5 MCG/ML-% IV SOLN
2.0000 ug/min | INTRAVENOUS | Status: AC
  Administered 2021-12-13: 10 ug/min via INTRAVENOUS
  Filled 2021-12-12: qty 250

## 2021-12-12 MED ORDER — LEVOFLOXACIN IN D5W 500 MG/100ML IV SOLN
500.0000 mg | INTRAVENOUS | Status: AC
  Administered 2021-12-13: 500 mg via INTRAVENOUS
  Filled 2021-12-12: qty 100

## 2021-12-12 MED ORDER — EPINEPHRINE HCL 5 MG/250ML IV SOLN IN NS
0.0000 ug/min | INTRAVENOUS | Status: DC
  Filled 2021-12-12: qty 250

## 2021-12-12 MED ORDER — PHENYLEPHRINE HCL-NACL 20-0.9 MG/250ML-% IV SOLN
30.0000 ug/min | INTRAVENOUS | Status: AC
  Administered 2021-12-13: 10 ug/min via INTRAVENOUS
  Filled 2021-12-12: qty 250

## 2021-12-12 MED ORDER — DEXMEDETOMIDINE HCL IN NACL 400 MCG/100ML IV SOLN
0.1000 ug/kg/h | INTRAVENOUS | Status: AC
  Administered 2021-12-13: .4 ug/kg/h via INTRAVENOUS
  Filled 2021-12-12: qty 100

## 2021-12-12 MED ORDER — INSULIN REGULAR(HUMAN) IN NACL 100-0.9 UT/100ML-% IV SOLN
INTRAVENOUS | Status: AC
  Administered 2021-12-13: 4 [IU]/h via INTRAVENOUS
  Filled 2021-12-12: qty 100

## 2021-12-12 MED ORDER — TRANEXAMIC ACID 1000 MG/10ML IV SOLN
1.5000 mg/kg/h | INTRAVENOUS | Status: AC
  Administered 2021-12-13: 1.5 mg/kg/h via INTRAVENOUS
  Filled 2021-12-12: qty 25

## 2021-12-12 MED ORDER — VANCOMYCIN HCL 1500 MG/300ML IV SOLN
1500.0000 mg | INTRAVENOUS | Status: AC
  Administered 2021-12-13: 1500 mg via INTRAVENOUS
  Filled 2021-12-12: qty 300

## 2021-12-12 NOTE — Progress Notes (Signed)
Discussed with pt and her church members IS (1250 ml), sternal precautions, mobility post op, and d/c planning. Pt voiced understanding. She lives alone but sts he sister is coming, although unclear how long she is staying. She also sts she has church family support. Reinforced that we would prefer 24/7 supervision at d/c. Gave materials to review. She declined ambulation right now due to visitors. Will f/u as time allows. [id number] [place], ACSM 11:34 AM 12/12/2021

## 2021-12-12 NOTE — Progress Notes (Addendum)
Progress Note  Patient Name: Katherine Pratt Date of Encounter: 12/12/2021  [facility] Cardiologist: Dr. Angelena Form  Subjective   Denies any CP or SOB. Has been ambulating without any issue.   Inpatient Medications    Scheduled Meds:  amLODipine  10 mg Oral Daily   aspirin EC  81 mg Oral Daily   atorvastatin  80 mg Oral Daily   [START ON 12/13/2021] epinephrine  0-10 mcg/min Intravenous To OR   [START ON 12/13/2021] heparin-papaverine-plasmalyte irrigation   Irrigation To OR   insulin aspart  0-15 Units Subcutaneous TID WC   [START ON 12/13/2021] insulin   Intravenous To OR   [START ON 12/13/2021] [place] Blood Cardioplegia vial (lidocaine/magnesium/mannitol 0.26g-4g-6.4g)   Intracoronary To OR   [START ON 12/13/2021] phenylephrine  30-200 mcg/min Intravenous To OR   [START ON 12/13/2021] potassium chloride  80 mEq Other To OR   sodium chloride flush  3 mL Intravenous Q12H   sodium chloride flush  3 mL Intravenous Q12H   [START ON 12/13/2021] tranexamic acid  15 mg/kg (Adjusted) Intravenous To OR   [START ON 12/13/2021] tranexamic acid  2 mg/kg Intracatheter To OR   Continuous Infusions:  sodium chloride     sodium chloride     [START ON 12/13/2021] dexmedetomidine     [START ON 12/13/2021] heparin 30,000 units/NS 1000 mL solution for CELLSAVER     heparin 1,400 Units/hr (12/12/21 0608)   [START ON 12/13/2021] levofloxacin (LEVAQUIN) IV     [START ON 12/13/2021] milrinone     [START ON 12/13/2021] nitroGLYCERIN     [START ON 12/13/2021] norepinephrine     [START ON 12/13/2021] tranexamic acid (CYKLOKAPRON) infusion (OHS)     [START ON 12/13/2021] vancomycin     PRN Meds: sodium chloride, sodium chloride, acetaminophen, ALPRAZolam, alum & mag hydroxide-simeth, diazepam, nitroGLYCERIN, ondansetron (ZOFRAN) IV, oxyCODONE-acetaminophen, polyvinyl alcohol, sodium chloride flush, sodium chloride flush, zolpidem   Vital Signs    Vitals:   12/11/21 1221 12/11/21 1300 12/11/21 1621 12/12/21 0305  BP: (!)  122/55 132/63 138/76 138/72  Pulse: 60 60 62   Resp: 12 16 20 18   Temp: 97.7 F (36.5 C)  99.2 F (37.3 C) 99.2 F (37.3 C)  TempSrc: Oral  Oral Oral  SpO2: 99% 100% 100% 100%  Weight:    108.3 kg  Height:        Intake/Output Summary (Last 24 hours) at 12/12/2021 0804 Last data filed at 12/12/2021 8299 Gross per 24 hour  Intake 1519.18 ml  Output 2400 ml  Net -880.82 ml   Last 3 Weights 12/12/2021 12/11/2021 12/09/2021  Weight (lbs) 238 lb 11.2 oz 238 lb 14.4 oz 245 lb  Weight (kg) 108.274 kg 108.364 kg 111.131 kg      Telemetry    NSR with occasional PVCs, HR 60s overnight. No significant bradycardia - Personally Reviewed  ECG    NSR with 1st degree AV block - Personally Reviewed  Physical Exam   GEN: No acute distress.   Neck: No JVD Cardiac: RRR, no murmurs, rubs, or gallops.  Respiratory: Clear to auscultation bilaterally. GI: Soft, nontender, non-distended  MS: No edema; No deformity. Neuro:  Nonfocal  Psych: Normal affect   Labs    High Sensitivity Troponin:   Recent Labs  Lab 12/09/21 1936 12/09/21 2148 12/10/21 0535 12/10/21 1407 12/10/21 1611  TROPONINIHS 288* 285* 226* 130* 126*     Chemistry Recent Labs  Lab 12/09/21 1936 12/11/21 0052 12/12/21 0343  NA 138 138 135  K 3.5 3.7 3.4*  CL 104 106 108  CO2 24 22 22   GLUCOSE 130* 169* 141*  BUN 13 11 9   CREATININE 0.98 0.88 0.89  CALCIUM 8.8* 8.3* 8.4*  PROT 7.1 5.8*  --   ALBUMIN 3.5 2.9*  --   AST 43* 25  --   ALT 57* 39  --   ALKPHOS 114 106  --   BILITOT 0.4 0.6  --   GFRNONAA >60 >60 >60  ANIONGAP 10 10 5     Lipids  Recent Labs  Lab 12/10/21 1407  CHOL 154  TRIG 96  HDL 32*  LDLCALC 103*  CHOLHDL 4.8    Hematology Recent Labs  Lab 12/09/21 1936 12/11/21 0052 12/12/21 0343  WBC 5.0 5.8 6.9  RBC 4.50 4.10 4.31  HGB 14.4 13.0 13.7  HCT 41.3 38.5 40.5  MCV 91.8 93.9 94.0  MCH 32.0 31.7 31.8  MCHC 34.9 33.8 33.8  RDW 12.6 12.4 12.5  PLT 283 256 247   Thyroid  Recent  Labs  Lab 12/10/21 1407  TSH 1.489    BNP Recent Labs  Lab 12/09/21 1936  BNP 123.5*    DDimer  Recent Labs  Lab 12/09/21 1936  DDIMER 0.63*     Radiology    CARDIAC CATHETERIZATION  Result Date: 12/11/2021   Prox RCA lesion is 95% stenosed.   Mid RCA lesion is 70% stenosed.   Dist RCA lesion is 50% stenosed.   1st RPL lesion is 80% stenosed.   1st Mrg-1 lesion is 90% stenosed.   1st Mrg-2 lesion is 80% stenosed.   3rd Mrg lesion is 95% stenosed.   Mid LAD-1 lesion is 90% stenosed.   Prox LAD to Mid LAD lesion is 50% stenosed.   Mid LAD-2 lesion is 30% stenosed.   Dist LAD-2 lesion is 40% stenosed.   Dist LAD-1 lesion is 50% stenosed.   The left ventricular systolic function is normal.   LV end diastolic pressure is normal.   The left ventricular ejection fraction is 50-55% by visual estimate. Severe multivessel CAD with coronary calcification and high-grade stenoses in all 3 major coronary arteries. Preserved global LV contractility with EF estimate at 55% with subtle mid anterolateral hypocontractility.  LVEDP 15 mmHg. RECOMMENDATION: Surgical consultation for consideration of CABG revascularization.  Increase anti-ischemic medical regimen.  Aggressive lipid-lowering therapy.   ECHOCARDIOGRAM COMPLETE  Result Date: 12/11/2021    ECHOCARDIOGRAM REPORT   Patient Name:   Katherine Pratt Date of Exam: 12/11/2021 Medical Rec #:  [id number]    Height:       65.0 in Accession #:    [id number]   Weight:       238.9 lb Date of Birth:  11-28-1951   BSA:          2.133 m Patient Age:    71 years     BP:           139/71 mmHg Patient Gender: F            HR:           61 bpm. Exam Location:  Inpatient Procedure: 2D Echo, Cardiac Doppler and Color Doppler Indications:    NSTEMI I21.4  History:        Patient has no prior history of Echocardiogram examinations.                 Risk Factors:Diabetes, Dyslipidemia and Hypertension.  Sonographer:    Bernadene Person RDCS Referring Phys: [phone]  RHONDA G BARRETT  IMPRESSIONS  1. Aortic valve appears calcified with indeterminant number of cusps; no AS by doppler.  2. Left ventricular ejection fraction, by estimation, is 55 to 60%. The left ventricle has normal function. The left ventricle has no regional wall motion abnormalities. There is mild left ventricular hypertrophy. Left ventricular diastolic parameters are indeterminate.  3. Right ventricular systolic function is normal. The right ventricular size is normal. There is normal pulmonary artery systolic pressure.  4. The mitral valve is normal in structure. Trivial mitral valve regurgitation. No evidence of mitral stenosis.  5. The aortic valve is normal in structure. Aortic valve regurgitation is not visualized. No aortic stenosis is present.  6. The inferior vena cava is dilated in size with >50% respiratory variability, suggesting right atrial pressure of 8 mmHg. Comparison(s): No prior Echocardiogram. FINDINGS  Left Ventricle: Left ventricular ejection fraction, by estimation, is 55 to 60%. The left ventricle has normal function. The left ventricle has no regional wall motion abnormalities. The left ventricular internal cavity size was normal in size. There is  mild left ventricular hypertrophy. Left ventricular diastolic parameters are indeterminate. Right Ventricle: The right ventricular size is normal. Right ventricular systolic function is normal. There is normal pulmonary artery systolic pressure. The tricuspid regurgitant velocity is 2.24 m/s, and with an assumed right atrial pressure of 8 mmHg,  the estimated right ventricular systolic pressure is 98.3 mmHg. Left Atrium: Left atrial size was normal in size. Right Atrium: Right atrial size was normal in size. Pericardium: There is no evidence of pericardial effusion. Mitral Valve: The mitral valve is normal in structure. Trivial mitral valve regurgitation. No evidence of mitral valve stenosis. Tricuspid Valve: The tricuspid valve is normal in structure.  Tricuspid valve regurgitation is trivial. No evidence of tricuspid stenosis. Aortic Valve: The aortic valve is normal in structure. Aortic valve regurgitation is not visualized. No aortic stenosis is present. Pulmonic Valve: The pulmonic valve was not well visualized. Pulmonic valve regurgitation is not visualized. No evidence of pulmonic stenosis. Aorta: The aortic root is normal in size and structure. Venous: The inferior vena cava is dilated in size with greater than 50% respiratory variability, suggesting right atrial pressure of 8 mmHg. IAS/Shunts: No atrial level shunt detected by color flow Doppler. Additional Comments: Aortic valve appears calcified with indeterminant number of cusps; no AS by doppler.  LEFT VENTRICLE PLAX 2D LVIDd:         3.50 cm   Diastology LVIDs:         2.70 cm   LV e' medial:    6.58 cm/s LV PW:         1.10 cm   LV E/e' medial:  15.0 LV IVS:        1.20 cm   LV e' lateral:   9.23 cm/s LVOT diam:     1.90 cm   LV E/e' lateral: 10.7 LV SV:         86 LV SV Index:   41 LVOT Area:     2.84 cm  RIGHT VENTRICLE RV S prime:     10.80 cm/s TAPSE (M-mode): 2.4 cm LEFT ATRIUM             Index        RIGHT ATRIUM           Index LA diam:        3.40 cm 1.59 cm/m   RA Area:     15.40 cm LA Vol (A2C):  42.6 ml 19.97 ml/m  RA Volume:   38.30 ml  17.95 ml/m LA Vol (A4C):   52.2 ml 24.47 ml/m LA Biplane Vol: 48.4 ml 22.69 ml/m  AORTIC VALVE LVOT Vmax:   137.00 cm/s LVOT Vmean:  84.400 cm/s LVOT VTI:    0.305 m  AORTA Ao Root diam: 2.90 cm Ao Asc diam:  2.90 cm MITRAL VALVE                TRICUSPID VALVE MV Area (PHT): 2.38 cm     TR Peak grad:   20.1 mmHg MV Decel Time: 319 msec     TR Vmax:        224.00 cm/s MV E velocity: 98.50 cm/s MV A velocity: 111.00 cm/s  SHUNTS MV E/A ratio:  0.89         Systemic VTI:  0.30 m                             Systemic Diam: 1.90 cm Kirk Ruths MD Electronically signed by Kirk Ruths MD Signature Date/Time: 12/11/2021/4:13:03 PM    Final      Cardiac Studies   Cath 12/11/2021   Prox RCA lesion is 95% stenosed.   Mid RCA lesion is 70% stenosed.   Dist RCA lesion is 50% stenosed.   1st RPL lesion is 80% stenosed.   1st Mrg-1 lesion is 90% stenosed.   1st Mrg-2 lesion is 80% stenosed.   3rd Mrg lesion is 95% stenosed.   Mid LAD-1 lesion is 90% stenosed.   Prox LAD to Mid LAD lesion is 50% stenosed.   Mid LAD-2 lesion is 30% stenosed.   Dist LAD-2 lesion is 40% stenosed.   Dist LAD-1 lesion is 50% stenosed.   The left ventricular systolic function is normal.   LV end diastolic pressure is normal.   The left ventricular ejection fraction is 50-55% by visual estimate.   Severe multivessel CAD with coronary calcification and high-grade stenoses in all 3 major coronary arteries.   Preserved global LV contractility with EF estimate at 55% with subtle mid anterolateral hypocontractility.  LVEDP 15 mmHg.   RECOMMENDATION: Surgical consultation for consideration of CABG revascularization.  Increase anti-ischemic medical regimen.  Aggressive lipid-lowering therapy.  Patient Profile     71 y.o. female with PMH of HTN, HLD, DM II and obesity presented with NSTEMI. Cath showed multivessel CAD, plan for CABG tomorrow.   Assessment & Plan    NSTEMI  - not on BB due to bradycardia.   - Cath 12/11/2021 multivessel CAD with 95% prox RCA, 70% mid RCA, 50% distal RCA, 80% RPL, 90% OM1, 80% OM1, 95% OM3, 90% mid LAD, 50% prox to mid LAD, 50% distal LAD. EF 50-55%  - Echo 12/11/2021 EF 55-60%, mild LVH, trivial MR.   - pending CABG tomorrow. If has any arrhythmia post op, may consider low dose BB, but would not be too aggressive with BB given prolonged 1st degree AV block and prior h/o bradycardia.   HTN: on amlodipine  HLD: continue lipitor  DM II   For questions or updates, please contact [place] Please consult [email or web address] for contact info under   Signed, Almyra Deforest, [place]  12/12/2021, 8:04 AM    I have personally seen and  examined this patient. I agree with the assessment and plan as outlined above. She has no chest pain this am. Multi-vessel CAD by cath yesterday. She has been seen  by CT surgery and CABG is planned for tomorrow.  Continue IV heparin, ASA, statin. No beta blocker with bradycardia.   Lauree Chandler 12/12/2021 8:32 AM

## 2021-12-12 NOTE — H&P (View-Only) (Signed)
1 Day Post-Op Procedure(s) (LRB): LEFT HEART CATH AND CORONARY ANGIOGRAPHY (N/A) Subjective: No complaints.  Objective: Vital signs in last 24 hours: Temp:  [98.6 F (37 C)-99.2 F (37.3 C)] 98.6 F (37 C) (01/05 1041) Pulse Rate:  [68] 68 (01/05 1041) Cardiac Rhythm: Heart block (01/05 0942) Resp:  [18] 18 (01/05 1041) BP: (138-144)/(72-77) 144/77 (01/05 1041) SpO2:  [99 %-100 %] 99 % (01/05 1041) Weight:  [108.3 kg] 108.3 kg (01/05 0305)  Hemodynamic parameters for last 24 hours:    Intake/Output from previous day: 01/04 0701 - 01/05 0700 In: 1519.2 [P.O.:120; I.V.:1399.2] Out: 2400 [Urine:2400] Intake/Output this shift: Total I/O In: 845.8 [P.O.:716; I.V.:129.8] Out: 450 [Urine:450]  General appearance: alert and cooperative Heart: regular rate and rhythm, S1, S2 normal, no murmur, click, rub or gallop Lungs: clear to auscultation bilaterally Extremities: no edema  Lab Results: Recent Labs    12/11/21 0052 12/12/21 0343  WBC 5.8 6.9  HGB 13.0 13.7  HCT 38.5 40.5  PLT 256 247   BMET:  Recent Labs    12/11/21 0052 12/12/21 0343  NA 138 135  K 3.7 3.4*  CL 106 108  CO2 22 22  GLUCOSE 169* 141*  BUN 11 9  CREATININE 0.88 0.89  CALCIUM 8.3* 8.4*    PT/INR: No results for input(s): LABPROT, INR in the last 72 hours. ABG No results found for: PHART, HCO3, TCO2, ACIDBASEDEF, O2SAT CBG (last 3)  Recent Labs    12/12/21 0608 12/12/21 1039 12/12/21 1641  GLUCAP 118* 177* 120*    Assessment/Plan:  She is stable for CABG tomorrow. I discussed the operative procedure again with the patient including alternatives, benefits and risks; including but not limited to bleeding, blood transfusion, infection, stroke, myocardial infarction, graft failure, heart block requiring a permanent pacemaker, organ dysfunction, and death.  Katherine Pratt understands and agrees to proceed.      LOS: 2 days    Gaye Pollack 12/12/2021

## 2021-12-12 NOTE — TOC Benefit Eligibility Note (Signed)
Patient Teacher, English as a foreign language completed.    The patient is currently admitted and upon discharge could be taking Farxiga 10 mg.  The current 30 day co-pay is, $95.00.   The patient is currently admitted and upon discharge could be taking Jardiance 10 mg.  The current 30 day co-pay is, $45.00.   The patient is insured through [place], [place] Patient Advocate Specialist [place] Patient Advocate Team Direct Number: [phone]  Fax: [phone]

## 2021-12-12 NOTE — Progress Notes (Signed)
Pre cabg has been completed.   Preliminary results in CV Proc.   Katherine Pratt Roben Schliep 12/12/2021 4:20 PM

## 2021-12-12 NOTE — Progress Notes (Signed)
Mobility Specialist: Progress Note   12/12/21 1314  Mobility  Activity Ambulated in hall  Level of Assistance Minimal assist, patient does 75% or more  Assistive Device  (IV Pole)  Distance Ambulated (ft) 350 ft  Mobility Ambulated with assistance in hallway  Mobility Response Tolerated well  Mobility performed by Mobility specialist  Bed Position Chair  $Mobility charge 1 Mobility   Pre-Mobility: 70 HR  Pt required minA to stand and was independent during ambulation with IV pole, no c/o throughout. Pt to BR and then to the recliner with call bell at her side.   [facility] Katherine Pratt Mobility Specialist Mobility Specialist [address] [place]: [phone] Mobility Specialist [address] [place] [address] [place]: [phone]

## 2021-12-12 NOTE — Progress Notes (Signed)
[place] for heparin Indication: chest pain/ACS  Allergies  Allergen Reactions   Aspirin Other (See Comments)    On meloxicam   Keflex [Cephalexin] Hives   Losartan Rash    Patient Measurements: Height: 5\' 5"  (165.1 cm) Weight: 108.3 kg (238 lb 11.2 oz) IBW/kg (Calculated) : 57 Heparin Dosing Weight: 83.2kg  Vital Signs: Temp: 99.2 F (37.3 C) (01/05 0305) Temp Source: Oral (01/05 0305) BP: 138/72 (01/05 0305)  Labs: Recent Labs    12/09/21 1936 12/09/21 2148 12/10/21 0535 12/10/21 2992 12/10/21 1407 12/10/21 1611 12/11/21 0052 12/12/21 0343  HGB 14.4  --   --   --   --   --  13.0 13.7  HCT 41.3  --   --   --   --   --  38.5 40.5  PLT 283  --   --   --   --   --  256 247  HEPARINUNFRC  --   --   --  0.47  --   --  0.23* 0.28*  CREATININE 0.98  --   --   --   --   --  0.88 0.89  TROPONINIHS 288*   < > 226*  --  130* 126*  --   --    < > = values in this interval not displayed.     Estimated Creatinine Clearance: 70.9 mL/min (by C-G formula based on SCr of 0.89 mg/dL).  Assessment: 71 y.o. female with chest pain on heparin. S/p LHC on 1/4 with multivessel CAD for CABG.   Repeat heparin level remains subtherapeutic on 1300 units/hr. H/H and Plt stable. No s/s of overt bleeding noted per RN.   Goal of Therapy:  Heparin level 0.3-0.7 units/ml Monitor platelets by anticoagulation protocol: Yes   Plan:  -Increase heparin to 1400 unit/hr -F/u 8 hr HL -Monitor for bleeding  -CABG planned for tomorrow   Albertina Parr, PharmD., BCPS, BCCCP Clinical Pharmacist Please refer to [facility] for unit-specific pharmacist

## 2021-12-12 NOTE — Progress Notes (Signed)
1 Day Post-Op Procedure(s) (LRB): LEFT HEART CATH AND CORONARY ANGIOGRAPHY (N/A) Subjective: No complaints.  Objective: Vital signs in last 24 hours: Temp:  [98.6 F (37 C)-99.2 F (37.3 C)] 98.6 F (37 C) (01/05 1041) Pulse Rate:  [68] 68 (01/05 1041) Cardiac Rhythm: Heart block (01/05 0942) Resp:  [18] 18 (01/05 1041) BP: (138-144)/(72-77) 144/77 (01/05 1041) SpO2:  [99 %-100 %] 99 % (01/05 1041) Weight:  [108.3 kg] 108.3 kg (01/05 0305)  Hemodynamic parameters for last 24 hours:    Intake/Output from previous day: 01/04 0701 - 01/05 0700 In: 1519.2 [P.O.:120; I.V.:1399.2] Out: 2400 [Urine:2400] Intake/Output this shift: Total I/O In: 845.8 [P.O.:716; I.V.:129.8] Out: 450 [Urine:450]  General appearance: alert and cooperative Heart: regular rate and rhythm, S1, S2 normal, no murmur, click, rub or gallop Lungs: clear to auscultation bilaterally Extremities: no edema  Lab Results: Recent Labs    12/11/21 0052 12/12/21 0343  WBC 5.8 6.9  HGB 13.0 13.7  HCT 38.5 40.5  PLT 256 247   BMET:  Recent Labs    12/11/21 0052 12/12/21 0343  NA 138 135  K 3.7 3.4*  CL 106 108  CO2 22 22  GLUCOSE 169* 141*  BUN 11 9  CREATININE 0.88 0.89  CALCIUM 8.3* 8.4*    PT/INR: No results for input(s): LABPROT, INR in the last 72 hours. ABG No results found for: PHART, HCO3, TCO2, ACIDBASEDEF, O2SAT CBG (last 3)  Recent Labs    12/12/21 0608 12/12/21 1039 12/12/21 1641  GLUCAP 118* 177* 120*    Assessment/Plan:  She is stable for CABG tomorrow. I discussed the operative procedure again with the patient including alternatives, benefits and risks; including but not limited to bleeding, blood transfusion, infection, stroke, myocardial infarction, graft failure, heart block requiring a permanent pacemaker, organ dysfunction, and death.  Katherine Pratt understands and agrees to proceed.      LOS: 2 days    Gaye Pollack 12/12/2021

## 2021-12-12 NOTE — Progress Notes (Signed)
ANTICOAGULATION CONSULT NOTE  Pharmacy Consult for heparin Indication: chest pain/ACS  Allergies  Allergen Reactions   Aspirin Other (See Comments)    On meloxicam   Keflex [Cephalexin] Hives   Losartan Rash    Patient Measurements: Height: 5\' 5"  (165.1 cm) Weight: 108.3 kg (238 lb 11.2 oz) IBW/kg (Calculated) : 57 Heparin Dosing Weight: 83.2kg  Vital Signs: Temp: 98.6 F (37 C) (01/05 1041) Temp Source: Oral (01/05 1041) BP: 144/77 (01/05 1041) Pulse Rate: 68 (01/05 1041)  Labs: Recent Labs    12/09/21 1936 12/09/21 2148 12/10/21 0535 12/10/21 0623 12/10/21 1407 12/10/21 1611 12/11/21 0052 12/12/21 0343 12/12/21 1431  HGB 14.4  --   --   --   --   --  13.0 13.7  --   HCT 41.3  --   --   --   --   --  38.5 40.5  --   PLT 283  --   --   --   --   --  256 247  --   HEPARINUNFRC  --   --   --    < >  --   --  0.23* 0.28* 0.52  CREATININE 0.98  --   --   --   --   --  0.88 0.89  --   TROPONINIHS 288*   < > 226*  --  130* 126*  --   --   --    < > = values in this interval not displayed.     Estimated Creatinine Clearance: 70.9 mL/min (by C-G formula based on SCr of 0.89 mg/dL).  Assessment: 70 y.o. female with chest pain on heparin. S/p LHC on 1/4 with multivessel CAD for CABG.   Repeat heparin level = 0.52, therapeutic  after heparin increased this morning to 1400 units/hr.  Today's H/H and Pltc are within normal and stable. No s/s of overt bleeding reported.  Goal of Therapy:  Heparin level 0.3-0.7 units/ml Monitor platelets by anticoagulation protocol: Yes   Plan:  -continue heparin to 1400 unit/hr -F/u AM heparin level -Monitor for bleeding  -CABG planned for tomorrow  12/13/21.    Nicole Cella, RPh Clinical Pharmacist 12/12/2021 4:59 PM Please refer to [facility] for unit-specific pharmacist

## 2021-12-13 ENCOUNTER — Inpatient Hospital Stay (HOSPITAL_COMMUNITY)
Admission: EM | Disposition: A | Discharge status: Home / Self Care | Payer: Self-pay | Source: Home / Self Care | Attending: Surgery

## 2021-12-13 ENCOUNTER — Inpatient Hospital Stay (HOSPITAL_COMMUNITY): Payer: Medicare HMO

## 2021-12-13 ENCOUNTER — Inpatient Hospital Stay (HOSPITAL_COMMUNITY): Payer: Medicare HMO | Admitting: Anesthesiology

## 2021-12-13 DIAGNOSIS — Z951 Presence of aortocoronary bypass graft: Secondary | ICD-10-CM

## 2021-12-13 DIAGNOSIS — I2510 Atherosclerotic heart disease of native coronary artery without angina pectoris: Secondary | ICD-10-CM

## 2021-12-13 HISTORY — PX: CORONARY ARTERY BYPASS GRAFT: SHX141

## 2021-12-13 HISTORY — PX: TEE WITHOUT CARDIOVERSION: SHX5443

## 2021-12-13 HISTORY — PX: ENDOVEIN HARVEST OF GREATER SAPHENOUS VEIN: SHX5059

## 2021-12-13 LAB — POCT I-STAT 7, (LYTES, BLD GAS, ICA,H+H)
Acid-Base Excess: 0 mmol/L (ref 0.0–2.0)
Acid-Base Excess: 0 mmol/L (ref 0.0–2.0)
Acid-Base Excess: 1 mmol/L (ref 0.0–2.0)
Acid-Base Excess: 0 mmol/L (ref 0.0–2.0)
Acid-Base Excess: 0 mmol/L (ref 0.0–2.0)
Acid-base deficit: 4 mmol/L — ABNORMAL HIGH (ref 0.0–2.0)
Acid-base deficit: 5 mmol/L — ABNORMAL HIGH (ref 0.0–2.0)
Acid-base deficit: 5 mmol/L — ABNORMAL HIGH (ref 0.0–2.0)
Bicarbonate: 24.9 mmol/L (ref 20.0–28.0)
Bicarbonate: 23.9 mmol/L (ref 20.0–28.0)
Bicarbonate: 24.1 mmol/L (ref 20.0–28.0)
Bicarbonate: 23.6 mmol/L (ref 20.0–28.0)
Bicarbonate: 23.9 mmol/L (ref 20.0–28.0)
Bicarbonate: 21.7 mmol/L (ref 20.0–28.0)
Bicarbonate: 21.3 mmol/L (ref 20.0–28.0)
Bicarbonate: 21.7 mmol/L (ref 20.0–28.0)
Calcium, Ion: 0.99 mmol/L — ABNORMAL LOW (ref 1.15–1.40)
Calcium, Ion: 1.04 mmol/L — ABNORMAL LOW (ref 1.15–1.40)
Calcium, Ion: 1.09 mmol/L — ABNORMAL LOW (ref 1.15–1.40)
Calcium, Ion: 1.08 mmol/L — ABNORMAL LOW (ref 1.15–1.40)
Calcium, Ion: 1.11 mmol/L — ABNORMAL LOW (ref 1.15–1.40)
Calcium, Ion: 1.14 mmol/L — ABNORMAL LOW (ref 1.15–1.40)
Calcium, Ion: 1.18 mmol/L (ref 1.15–1.40)
Calcium, Ion: 1.17 mmol/L (ref 1.15–1.40)
HCT: 27 % — ABNORMAL LOW (ref 36.0–46.0)
HCT: 26 % — ABNORMAL LOW (ref 36.0–46.0)
HCT: 26 % — ABNORMAL LOW (ref 36.0–46.0)
HCT: 27 % — ABNORMAL LOW (ref 36.0–46.0)
HCT: 29 % — ABNORMAL LOW (ref 36.0–46.0)
HCT: 32 % — ABNORMAL LOW (ref 36.0–46.0)
HCT: 35 % — ABNORMAL LOW (ref 36.0–46.0)
HCT: 34 % — ABNORMAL LOW (ref 36.0–46.0)
Hemoglobin: 9.2 g/dL — ABNORMAL LOW (ref 12.0–15.0)
Hemoglobin: 8.8 g/dL — ABNORMAL LOW (ref 12.0–15.0)
Hemoglobin: 8.8 g/dL — ABNORMAL LOW (ref 12.0–15.0)
Hemoglobin: 9.2 g/dL — ABNORMAL LOW (ref 12.0–15.0)
Hemoglobin: 9.9 g/dL — ABNORMAL LOW (ref 12.0–15.0)
Hemoglobin: 10.9 g/dL — ABNORMAL LOW (ref 12.0–15.0)
Hemoglobin: 11.9 g/dL — ABNORMAL LOW (ref 12.0–15.0)
Hemoglobin: 11.6 g/dL — ABNORMAL LOW (ref 12.0–15.0)
O2 Saturation: 100 %
O2 Saturation: 100 %
O2 Saturation: 100 %
O2 Saturation: 100 %
O2 Saturation: 100 %
O2 Saturation: 99 %
O2 Saturation: 95 %
O2 Saturation: 98 %
Patient temperature: 36.4
Patient temperature: 36.5
Patient temperature: 36.1
Potassium: 4.3 mmol/L (ref 3.5–5.1)
Potassium: 4.9 mmol/L (ref 3.5–5.1)
Potassium: 5 mmol/L (ref 3.5–5.1)
Potassium: 4.6 mmol/L (ref 3.5–5.1)
Potassium: 5.1 mmol/L (ref 3.5–5.1)
Potassium: 3.9 mmol/L (ref 3.5–5.1)
Potassium: 5.3 mmol/L — ABNORMAL HIGH (ref 3.5–5.1)
Potassium: 4.9 mmol/L (ref 3.5–5.1)
Sodium: 141 mmol/L (ref 135–145)
Sodium: 139 mmol/L (ref 135–145)
Sodium: 139 mmol/L (ref 135–145)
Sodium: 139 mmol/L (ref 135–145)
Sodium: 140 mmol/L (ref 135–145)
Sodium: 142 mmol/L (ref 135–145)
Sodium: 140 mmol/L (ref 135–145)
Sodium: 140 mmol/L (ref 135–145)
TCO2: 26 mmol/L (ref 22–32)
TCO2: 25 mmol/L (ref 22–32)
TCO2: 25 mmol/L (ref 22–32)
TCO2: 25 mmol/L (ref 22–32)
TCO2: 25 mmol/L (ref 22–32)
TCO2: 23 mmol/L (ref 22–32)
TCO2: 23 mmol/L (ref 22–32)
TCO2: 23 mmol/L (ref 22–32)
pCO2 arterial: 38.1 mmHg (ref 32.0–48.0)
pCO2 arterial: 32.7 mmHg (ref 32.0–48.0)
pCO2 arterial: 33.1 mmHg (ref 32.0–48.0)
pCO2 arterial: 33.4 mmHg (ref 32.0–48.0)
pCO2 arterial: 34.9 mmHg (ref 32.0–48.0)
pCO2 arterial: 39.1 mmHg (ref 32.0–48.0)
pCO2 arterial: 43.2 mmHg (ref 32.0–48.0)
pCO2 arterial: 43.7 mmHg (ref 32.0–48.0)
pH, Arterial: 7.423 (ref 7.350–7.450)
pH, Arterial: 7.47 — ABNORMAL HIGH (ref 7.350–7.450)
pH, Arterial: 7.471 — ABNORMAL HIGH (ref 7.350–7.450)
pH, Arterial: 7.444 (ref 7.350–7.450)
pH, Arterial: 7.456 — ABNORMAL HIGH (ref 7.350–7.450)
pH, Arterial: 7.35 (ref 7.350–7.450)
pH, Arterial: 7.298 — ABNORMAL LOW (ref 7.350–7.450)
pH, Arterial: 7.3 — ABNORMAL LOW (ref 7.350–7.450)
pO2, Arterial: 456 mmHg — ABNORMAL HIGH (ref 83.0–108.0)
pO2, Arterial: 369 mmHg — ABNORMAL HIGH (ref 83.0–108.0)
pO2, Arterial: 399 mmHg — ABNORMAL HIGH (ref 83.0–108.0)
pO2, Arterial: 289 mmHg — ABNORMAL HIGH (ref 83.0–108.0)
pO2, Arterial: 408 mmHg — ABNORMAL HIGH (ref 83.0–108.0)
pO2, Arterial: 140 mmHg — ABNORMAL HIGH (ref 83.0–108.0)
pO2, Arterial: 82 mmHg — ABNORMAL LOW (ref 83.0–108.0)
pO2, Arterial: 104 mmHg (ref 83.0–108.0)

## 2021-12-13 LAB — GLUCOSE, CAPILLARY
Glucose-Capillary: 161 mg/dL — ABNORMAL HIGH (ref 70–99)
Glucose-Capillary: 126 mg/dL — ABNORMAL HIGH (ref 70–99)
Glucose-Capillary: 138 mg/dL — ABNORMAL HIGH (ref 70–99)
Glucose-Capillary: 129 mg/dL — ABNORMAL HIGH (ref 70–99)
Glucose-Capillary: 139 mg/dL — ABNORMAL HIGH (ref 70–99)
Glucose-Capillary: 151 mg/dL — ABNORMAL HIGH (ref 70–99)
Glucose-Capillary: 185 mg/dL — ABNORMAL HIGH (ref 70–99)
Glucose-Capillary: 196 mg/dL — ABNORMAL HIGH (ref 70–99)
Glucose-Capillary: 169 mg/dL — ABNORMAL HIGH (ref 70–99)
Glucose-Capillary: 145 mg/dL — ABNORMAL HIGH (ref 70–99)
Glucose-Capillary: 139 mg/dL — ABNORMAL HIGH (ref 70–99)

## 2021-12-13 LAB — URINALYSIS, ROUTINE W REFLEX MICROSCOPIC
Bilirubin Urine: NEGATIVE
Glucose, UA: NEGATIVE mg/dL
Hgb urine dipstick: NEGATIVE
Ketones, ur: NEGATIVE mg/dL
Leukocytes,Ua: NEGATIVE
Nitrite: NEGATIVE
Protein, ur: NEGATIVE mg/dL
Specific Gravity, Urine: 1.02 (ref 1.005–1.030)
pH: 6 (ref 5.0–8.0)

## 2021-12-13 LAB — CBC
HCT: 39.3 % (ref 36.0–46.0)
HCT: 32.6 % — ABNORMAL LOW (ref 36.0–46.0)
HCT: 36.2 % (ref 36.0–46.0)
Hemoglobin: 13.3 g/dL (ref 12.0–15.0)
Hemoglobin: 10.6 g/dL — ABNORMAL LOW (ref 12.0–15.0)
Hemoglobin: 11.7 g/dL — ABNORMAL LOW (ref 12.0–15.0)
MCH: 31.9 pg (ref 26.0–34.0)
MCH: 31.1 pg (ref 26.0–34.0)
MCH: 31.2 pg (ref 26.0–34.0)
MCHC: 33.8 g/dL (ref 30.0–36.0)
MCHC: 32.5 g/dL (ref 30.0–36.0)
MCHC: 32.3 g/dL (ref 30.0–36.0)
MCV: 94.2 fL (ref 80.0–100.0)
MCV: 95.6 fL (ref 80.0–100.0)
MCV: 96.5 fL (ref 80.0–100.0)
Platelets: 260 10*3/uL (ref 150–400)
Platelets: 143 10*3/uL — ABNORMAL LOW (ref 150–400)
Platelets: 162 10*3/uL (ref 150–400)
RBC: 4.17 MIL/uL (ref 3.87–5.11)
RBC: 3.41 MIL/uL — ABNORMAL LOW (ref 3.87–5.11)
RBC: 3.75 MIL/uL — ABNORMAL LOW (ref 3.87–5.11)
RDW: 12.5 % (ref 11.5–15.5)
RDW: 12.4 % (ref 11.5–15.5)
RDW: 12.6 % (ref 11.5–15.5)
WBC: 7.3 10*3/uL (ref 4.0–10.5)
WBC: 9.4 10*3/uL (ref 4.0–10.5)
WBC: 10.5 10*3/uL (ref 4.0–10.5)
nRBC: 0 % (ref 0.0–0.2)
nRBC: 0 % (ref 0.0–0.2)
nRBC: 0 % (ref 0.0–0.2)

## 2021-12-13 LAB — POCT I-STAT EG7
Acid-base deficit: 1 mmol/L (ref 0.0–2.0)
Bicarbonate: 23.3 mmol/L (ref 20.0–28.0)
Calcium, Ion: 1.03 mmol/L — ABNORMAL LOW (ref 1.15–1.40)
HCT: 26 % — ABNORMAL LOW (ref 36.0–46.0)
Hemoglobin: 8.8 g/dL — ABNORMAL LOW (ref 12.0–15.0)
O2 Saturation: 83 %
Potassium: 7 mmol/L (ref 3.5–5.1)
Sodium: 140 mmol/L (ref 135–145)
TCO2: 24 mmol/L (ref 22–32)
pCO2, Ven: 38.5 mmHg — ABNORMAL LOW (ref 44.0–60.0)
pH, Ven: 7.391 (ref 7.250–7.430)
pO2, Ven: 48 mmHg — ABNORMAL HIGH (ref 32.0–45.0)

## 2021-12-13 LAB — POCT I-STAT, CHEM 8
BUN: 13 mg/dL (ref 8–23)
BUN: 12 mg/dL (ref 8–23)
BUN: 11 mg/dL (ref 8–23)
BUN: 11 mg/dL (ref 8–23)
BUN: 11 mg/dL (ref 8–23)
Calcium, Ion: 1.26 mmol/L (ref 1.15–1.40)
Calcium, Ion: 1.28 mmol/L (ref 1.15–1.40)
Calcium, Ion: 1.03 mmol/L — ABNORMAL LOW (ref 1.15–1.40)
Calcium, Ion: 1.07 mmol/L — ABNORMAL LOW (ref 1.15–1.40)
Calcium, Ion: 1.09 mmol/L — ABNORMAL LOW (ref 1.15–1.40)
Chloride: 106 mmol/L (ref 98–111)
Chloride: 107 mmol/L (ref 98–111)
Chloride: 106 mmol/L (ref 98–111)
Chloride: 107 mmol/L (ref 98–111)
Chloride: 107 mmol/L (ref 98–111)
Creatinine, Ser: 0.7 mg/dL (ref 0.44–1.00)
Creatinine, Ser: 0.7 mg/dL (ref 0.44–1.00)
Creatinine, Ser: 0.5 mg/dL (ref 0.44–1.00)
Creatinine, Ser: 0.6 mg/dL (ref 0.44–1.00)
Creatinine, Ser: 0.7 mg/dL (ref 0.44–1.00)
Glucose, Bld: 140 mg/dL — ABNORMAL HIGH (ref 70–99)
Glucose, Bld: 104 mg/dL — ABNORMAL HIGH (ref 70–99)
Glucose, Bld: 81 mg/dL (ref 70–99)
Glucose, Bld: 144 mg/dL — ABNORMAL HIGH (ref 70–99)
Glucose, Bld: 156 mg/dL — ABNORMAL HIGH (ref 70–99)
HCT: 37 % (ref 36.0–46.0)
HCT: 35 % — ABNORMAL LOW (ref 36.0–46.0)
HCT: 29 % — ABNORMAL LOW (ref 36.0–46.0)
HCT: 26 % — ABNORMAL LOW (ref 36.0–46.0)
HCT: 30 % — ABNORMAL LOW (ref 36.0–46.0)
Hemoglobin: 12.6 g/dL (ref 12.0–15.0)
Hemoglobin: 11.9 g/dL — ABNORMAL LOW (ref 12.0–15.0)
Hemoglobin: 9.9 g/dL — ABNORMAL LOW (ref 12.0–15.0)
Hemoglobin: 10.2 g/dL — ABNORMAL LOW (ref 12.0–15.0)
Hemoglobin: 8.8 g/dL — ABNORMAL LOW (ref 12.0–15.0)
Potassium: 4.1 mmol/L (ref 3.5–5.1)
Potassium: 3.8 mmol/L (ref 3.5–5.1)
Potassium: 4.9 mmol/L (ref 3.5–5.1)
Potassium: 4.5 mmol/L (ref 3.5–5.1)
Potassium: 5.2 mmol/L — ABNORMAL HIGH (ref 3.5–5.1)
Sodium: 139 mmol/L (ref 135–145)
Sodium: 141 mmol/L (ref 135–145)
Sodium: 138 mmol/L (ref 135–145)
Sodium: 138 mmol/L (ref 135–145)
Sodium: 139 mmol/L (ref 135–145)
TCO2: 27 mmol/L (ref 22–32)
TCO2: 24 mmol/L (ref 22–32)
TCO2: 25 mmol/L (ref 22–32)
TCO2: 24 mmol/L (ref 22–32)
TCO2: 25 mmol/L (ref 22–32)

## 2021-12-13 LAB — BLOOD GAS, ARTERIAL
Acid-base deficit: 0.5 mmol/L (ref 0.0–2.0)
Bicarbonate: 23.9 mmol/L (ref 20.0–28.0)
Drawn by: 270271
FIO2: 21
O2 Saturation: 97 %
Patient temperature: 37
pCO2 arterial: 40.6 mmHg (ref 32.0–48.0)
pH, Arterial: 7.387 (ref 7.350–7.450)
pO2, Arterial: 92.8 mmHg (ref 83.0–108.0)

## 2021-12-13 LAB — HEMOGLOBIN AND HEMATOCRIT, BLOOD
HCT: 27.4 % — ABNORMAL LOW (ref 36.0–46.0)
Hemoglobin: 9.4 g/dL — ABNORMAL LOW (ref 12.0–15.0)

## 2021-12-13 LAB — BASIC METABOLIC PANEL
Anion gap: 3 — ABNORMAL LOW (ref 5–15)
BUN: 11 mg/dL (ref 8–23)
CO2: 21 mmol/L — ABNORMAL LOW (ref 22–32)
Calcium: 7.6 mg/dL — ABNORMAL LOW (ref 8.9–10.3)
Chloride: 112 mmol/L — ABNORMAL HIGH (ref 98–111)
Creatinine, Ser: 0.78 mg/dL (ref 0.44–1.00)
GFR, Estimated: >60 mL/min (ref >60)
Glucose, Bld: 169 mg/dL — ABNORMAL HIGH (ref 70–99)
Potassium: 5.3 mmol/L — ABNORMAL HIGH (ref 3.5–5.1)
Sodium: 136 mmol/L (ref 135–145)

## 2021-12-13 LAB — HEPARIN LEVEL (UNFRACTIONATED): Heparin Unfractionated: 0.73 IU/mL — ABNORMAL HIGH (ref 0.30–0.70)

## 2021-12-13 LAB — PROTIME-INR
INR: 1.5 — ABNORMAL HIGH (ref 0.8–1.2)
Prothrombin Time: 18.5 seconds — ABNORMAL HIGH (ref 11.4–15.2)

## 2021-12-13 LAB — MAGNESIUM: Magnesium: 3.6 mg/dL — ABNORMAL HIGH (ref 1.7–2.4)

## 2021-12-13 LAB — SURGICAL PCR SCREEN
MRSA, PCR: NEGATIVE
Staphylococcus aureus: NEGATIVE

## 2021-12-13 LAB — PLATELET COUNT: Platelets: 168 10*3/uL (ref 150–400)

## 2021-12-13 LAB — APTT: aPTT: 37 seconds — ABNORMAL HIGH (ref 24–36)

## 2021-12-13 SURGERY — CORONARY ARTERY BYPASS GRAFTING (CABG)
Anesthesia: General | Site: Chest | Laterality: Right

## 2021-12-13 MED ORDER — INSULIN REGULAR(HUMAN) IN NACL 100-0.9 UT/100ML-% IV SOLN: INTRAVENOUS | Status: DC

## 2021-12-13 MED ORDER — THROMBIN (RECOMBINANT) 20000 UNITS EX SOLR
CUTANEOUS | Status: AC
  Filled 2021-12-13: qty 20000

## 2021-12-13 MED ORDER — LACTATED RINGERS IV SOLN: INTRAVENOUS | Status: DC | PRN

## 2021-12-13 MED ORDER — NITROGLYCERIN IN D5W 200-5 MCG/ML-% IV SOLN: 0.0000 ug/min | INTRAVENOUS | Status: DC

## 2021-12-13 MED ORDER — ALBUMIN HUMAN 5 % IV SOLN: INTRAVENOUS | Status: DC | PRN

## 2021-12-13 MED ORDER — ACETAMINOPHEN 500 MG PO TABS
1000.0000 mg | ORAL_TABLET | Freq: Four times a day (QID) | ORAL | Status: AC
  Administered 2021-12-14 – 2021-12-18 (×9): 1000 mg via ORAL
  Filled 2021-12-13 (×11): qty 2

## 2021-12-13 MED ORDER — PHENYLEPHRINE 40 MCG/ML (10ML) SYRINGE FOR IV PUSH (FOR BLOOD PRESSURE SUPPORT)
PREFILLED_SYRINGE | INTRAVENOUS | Status: AC
  Filled 2021-12-13: qty 20

## 2021-12-13 MED ORDER — SODIUM BICARBONATE 8.4 % IV SOLN
50.0000 meq | Freq: Once | INTRAVENOUS | Status: AC
  Administered 2021-12-13: 50 meq via INTRAVENOUS

## 2021-12-13 MED ORDER — SODIUM CHLORIDE 0.45 % IV SOLN: INTRAVENOUS | Status: DC | PRN

## 2021-12-13 MED ORDER — ASPIRIN EC 325 MG PO TBEC
325.0000 mg | DELAYED_RELEASE_TABLET | Freq: Every day | ORAL | Status: DC
  Administered 2021-12-14 – 2021-12-18 (×4): 325 mg via ORAL
  Filled 2021-12-13 (×5): qty 1

## 2021-12-13 MED ORDER — FENTANYL CITRATE (PF) 250 MCG/5ML IJ SOLN
INTRAMUSCULAR | Status: AC
  Filled 2021-12-13: qty 5

## 2021-12-13 MED ORDER — PHENYLEPHRINE HCL-NACL 20-0.9 MG/250ML-% IV SOLN: 0.0000 ug/min | INTRAVENOUS | Status: DC

## 2021-12-13 MED ORDER — MIDAZOLAM HCL (PF) 10 MG/2ML IJ SOLN
INTRAMUSCULAR | Status: AC
  Filled 2021-12-13: qty 2

## 2021-12-13 MED ORDER — METOPROLOL TARTRATE 12.5 MG HALF TABLET
12.5000 mg | ORAL_TABLET | Freq: Two times a day (BID) | ORAL | Status: DC
  Administered 2021-12-14 – 2021-12-15 (×2): 12.5 mg via ORAL
  Filled 2021-12-13 (×3): qty 1

## 2021-12-13 MED ORDER — METOPROLOL TARTRATE 5 MG/5ML IV SOLN: 2.5000 mg | INTRAVENOUS | Status: DC | PRN

## 2021-12-13 MED ORDER — LACTATED RINGERS IV SOLN: INTRAVENOUS | Status: DC

## 2021-12-13 MED ORDER — ORAL CARE MOUTH RINSE
15.0000 mL | OROMUCOSAL | Status: DC
  Administered 2021-12-13 – 2021-12-14 (×6): 15 mL via OROMUCOSAL

## 2021-12-13 MED ORDER — PLASMA-LYTE A IV SOLN
INTRAVENOUS | Status: DC | PRN
  Administered 2021-12-13: 1000 mL

## 2021-12-13 MED ORDER — FENTANYL CITRATE (PF) 250 MCG/5ML IJ SOLN
INTRAMUSCULAR | Status: DC | PRN
  Administered 2021-12-13: 150 ug via INTRAVENOUS
  Administered 2021-12-13 (×4): 100 ug via INTRAVENOUS
  Administered 2021-12-13: 50 ug via INTRAVENOUS
  Administered 2021-12-13: 200 ug via INTRAVENOUS
  Administered 2021-12-13 (×2): 100 ug via INTRAVENOUS
  Administered 2021-12-13 (×2): 50 ug via INTRAVENOUS
  Administered 2021-12-13: 150 ug via INTRAVENOUS

## 2021-12-13 MED ORDER — OXYCODONE HCL 5 MG PO TABS
5.0000 mg | ORAL_TABLET | ORAL | Status: DC | PRN
  Administered 2021-12-14 – 2021-12-15 (×8): 5 mg via ORAL
  Filled 2021-12-13 (×9): qty 1

## 2021-12-13 MED ORDER — CHLORHEXIDINE GLUCONATE 0.12% ORAL RINSE (MEDLINE KIT)
15.0000 mL | Freq: Two times a day (BID) | OROMUCOSAL | Status: DC
  Administered 2021-12-13: 15 mL via OROMUCOSAL

## 2021-12-13 MED ORDER — CHLORHEXIDINE GLUCONATE 0.12 % MT SOLN
15.0000 mL | OROMUCOSAL | Status: AC
  Administered 2021-12-13: 15 mL via OROMUCOSAL

## 2021-12-13 MED ORDER — SODIUM CHLORIDE 0.9 % IV SOLN: 250.0000 mL | INTRAVENOUS | Status: DC

## 2021-12-13 MED ORDER — DEXTROSE 50 % IV SOLN: 0.0000 mL | INTRAVENOUS | Status: DC | PRN

## 2021-12-13 MED ORDER — HEPARIN SODIUM (PORCINE) 1000 UNIT/ML IJ SOLN
INTRAMUSCULAR | Status: AC
  Filled 2021-12-13: qty 1

## 2021-12-13 MED ORDER — DEXMEDETOMIDINE HCL IN NACL 400 MCG/100ML IV SOLN
0.0000 ug/kg/h | INTRAVENOUS | Status: DC
  Administered 2021-12-13: 0.7 ug/kg/h via INTRAVENOUS
  Filled 2021-12-13: qty 100

## 2021-12-13 MED ORDER — BISACODYL 5 MG PO TBEC
10.0000 mg | DELAYED_RELEASE_TABLET | Freq: Every day | ORAL | Status: DC
  Administered 2021-12-14 – 2021-12-20 (×4): 10 mg via ORAL
  Filled 2021-12-13 (×5): qty 2

## 2021-12-13 MED ORDER — PROTAMINE SULFATE 10 MG/ML IV SOLN
INTRAVENOUS | Status: DC | PRN
  Administered 2021-12-13: 300 mg via INTRAVENOUS

## 2021-12-13 MED ORDER — TRAMADOL HCL 50 MG PO TABS
50.0000 mg | ORAL_TABLET | ORAL | Status: DC | PRN
  Administered 2021-12-14: 100 mg via ORAL
  Filled 2021-12-13: qty 2

## 2021-12-13 MED ORDER — DOCUSATE SODIUM 100 MG PO CAPS
200.0000 mg | ORAL_CAPSULE | Freq: Every day | ORAL | Status: DC
  Administered 2021-12-14 – 2021-12-20 (×5): 200 mg via ORAL
  Filled 2021-12-13 (×6): qty 2

## 2021-12-13 MED ORDER — FAMOTIDINE IN NACL 20-0.9 MG/50ML-% IV SOLN
20.0000 mg | Freq: Two times a day (BID) | INTRAVENOUS | Status: AC
  Administered 2021-12-13 (×2): 20 mg via INTRAVENOUS
  Filled 2021-12-13 (×2): qty 50

## 2021-12-13 MED ORDER — LACTATED RINGERS IV SOLN: 500.0000 mL | Freq: Once | INTRAVENOUS | Status: DC | PRN

## 2021-12-13 MED ORDER — ROCURONIUM BROMIDE 10 MG/ML (PF) SYRINGE
PREFILLED_SYRINGE | INTRAVENOUS | Status: DC | PRN
Start: 2021-12-13 — End: 2021-12-13
  Administered 2021-12-13: 50 mg via INTRAVENOUS
  Administered 2021-12-13: 80 mg via INTRAVENOUS
  Administered 2021-12-13 (×2): 50 mg via INTRAVENOUS

## 2021-12-13 MED ORDER — SODIUM CHLORIDE 0.9 % IV SOLN: INTRAVENOUS | Status: DC

## 2021-12-13 MED ORDER — PROPOFOL 10 MG/ML IV BOLUS
INTRAVENOUS | Status: DC | PRN
Start: 2021-12-13 — End: 2021-12-13
  Administered 2021-12-13: 10 mg via INTRAVENOUS
  Administered 2021-12-13: 30 mg via INTRAVENOUS

## 2021-12-13 MED ORDER — ROCURONIUM BROMIDE 10 MG/ML (PF) SYRINGE
PREFILLED_SYRINGE | INTRAVENOUS | Status: AC
  Filled 2021-12-13: qty 30

## 2021-12-13 MED ORDER — CHLORHEXIDINE GLUCONATE CLOTH 2 % EX PADS
6.0000 | MEDICATED_PAD | Freq: Every day | CUTANEOUS | Status: DC
  Administered 2021-12-13 – 2021-12-14 (×2): 6 via TOPICAL

## 2021-12-13 MED ORDER — HEPARIN SODIUM (PORCINE) 1000 UNIT/ML IJ SOLN
INTRAMUSCULAR | Status: AC
  Filled 2021-12-13: qty 10

## 2021-12-13 MED ORDER — METOPROLOL TARTRATE 25 MG/10 ML ORAL SUSPENSION: 12.5000 mg | Freq: Two times a day (BID) | ORAL | Status: DC

## 2021-12-13 MED ORDER — SODIUM CHLORIDE 0.9% FLUSH: 3.0000 mL | INTRAVENOUS | Status: DC | PRN

## 2021-12-13 MED ORDER — PHENYLEPHRINE 40 MCG/ML (10ML) SYRINGE FOR IV PUSH (FOR BLOOD PRESSURE SUPPORT)
PREFILLED_SYRINGE | INTRAVENOUS | Status: DC | PRN
  Administered 2021-12-13: 80 ug via INTRAVENOUS
  Administered 2021-12-13: 120 ug via INTRAVENOUS

## 2021-12-13 MED ORDER — PROTAMINE SULFATE 10 MG/ML IV SOLN
INTRAVENOUS | Status: AC
  Filled 2021-12-13: qty 5

## 2021-12-13 MED ORDER — LEVOFLOXACIN IN D5W 750 MG/150ML IV SOLN
750.0000 mg | INTRAVENOUS | Status: AC
  Administered 2021-12-14: 750 mg via INTRAVENOUS
  Filled 2021-12-13: qty 150

## 2021-12-13 MED ORDER — BISACODYL 10 MG RE SUPP: 10.0000 mg | Freq: Every day | RECTAL | Status: DC

## 2021-12-13 MED ORDER — ACETAMINOPHEN 650 MG RE SUPP
650.0000 mg | Freq: Once | RECTAL | Status: AC
  Administered 2021-12-13: 650 mg via RECTAL

## 2021-12-13 MED ORDER — THROMBIN 20000 UNITS EX SOLR
OROMUCOSAL | Status: DC | PRN
  Administered 2021-12-13 (×3): 4 mL via TOPICAL

## 2021-12-13 MED ORDER — ONDANSETRON HCL 4 MG/2ML IJ SOLN
4.0000 mg | Freq: Four times a day (QID) | INTRAMUSCULAR | Status: DC | PRN
  Administered 2021-12-14 – 2021-12-15 (×4): 4 mg via INTRAVENOUS
  Filled 2021-12-13 (×4): qty 2

## 2021-12-13 MED ORDER — MAGNESIUM SULFATE 4 GM/100ML IV SOLN
4.0000 g | Freq: Once | INTRAVENOUS | Status: AC
  Administered 2021-12-13: 4 g via INTRAVENOUS
  Filled 2021-12-13: qty 100

## 2021-12-13 MED ORDER — VANCOMYCIN HCL IN DEXTROSE 1-5 GM/200ML-% IV SOLN
1000.0000 mg | Freq: Once | INTRAVENOUS | Status: AC
  Administered 2021-12-13: 1000 mg via INTRAVENOUS
  Filled 2021-12-13: qty 200

## 2021-12-13 MED ORDER — HEPARIN SODIUM (PORCINE) 1000 UNIT/ML IJ SOLN
INTRAMUSCULAR | Status: DC | PRN
Start: 2021-12-13 — End: 2021-12-13
  Administered 2021-12-13: 33000 [IU] via INTRAVENOUS

## 2021-12-13 MED ORDER — MIDAZOLAM HCL 2 MG/2ML IJ SOLN
INTRAMUSCULAR | Status: DC | PRN
  Administered 2021-12-13: 1 mg via INTRAVENOUS
  Administered 2021-12-13 (×2): 2 mg via INTRAVENOUS
  Administered 2021-12-13: 3 mg via INTRAVENOUS
  Administered 2021-12-13 (×2): 1 mg via INTRAVENOUS

## 2021-12-13 MED ORDER — ACETAMINOPHEN 160 MG/5ML PO SOLN: 650.0000 mg | Freq: Once | ORAL | Status: AC

## 2021-12-13 MED ORDER — POTASSIUM CHLORIDE 10 MEQ/50ML IV SOLN
10.0000 meq | INTRAVENOUS | Status: AC
  Administered 2021-12-13 (×3): 10 meq via INTRAVENOUS

## 2021-12-13 MED ORDER — HEMOSTATIC AGENTS (NO CHARGE) OPTIME
TOPICAL | Status: DC | PRN
  Administered 2021-12-13: 1 via TOPICAL

## 2021-12-13 MED ORDER — ALBUMIN HUMAN 5 % IV SOLN
250.0000 mL | INTRAVENOUS | Status: AC | PRN
  Administered 2021-12-13 (×4): 12.5 g via INTRAVENOUS
  Filled 2021-12-13 (×3): qty 250

## 2021-12-13 MED ORDER — ACETAMINOPHEN 160 MG/5ML PO SOLN: 1000.0000 mg | Freq: Four times a day (QID) | ORAL | Status: AC

## 2021-12-13 MED ORDER — FAMOTIDINE IN NACL 20-0.9 MG/50ML-% IV SOLN
INTRAVENOUS | Status: AC
  Filled 2021-12-13: qty 50

## 2021-12-13 MED ORDER — MIDAZOLAM HCL 2 MG/2ML IJ SOLN: 2.0000 mg | INTRAMUSCULAR | Status: DC | PRN

## 2021-12-13 MED ORDER — SODIUM CHLORIDE (PF) 0.9 % IJ SOLN
INTRAMUSCULAR | Status: AC
  Filled 2021-12-13: qty 10

## 2021-12-13 MED ORDER — PROPOFOL 10 MG/ML IV BOLUS
INTRAVENOUS | Status: AC
  Filled 2021-12-13: qty 20

## 2021-12-13 MED ORDER — 0.9 % SODIUM CHLORIDE (POUR BTL) OPTIME
TOPICAL | Status: DC | PRN
Start: 2021-12-13 — End: 2021-12-13
  Administered 2021-12-13: 5000 mL

## 2021-12-13 MED ORDER — SODIUM CHLORIDE 0.9 % IV SOLN
INTRAVENOUS | Status: DC | PRN
Start: 2021-12-13 — End: 2021-12-13

## 2021-12-13 MED ORDER — PROTAMINE SULFATE 10 MG/ML IV SOLN
INTRAVENOUS | Status: AC
  Filled 2021-12-13: qty 25

## 2021-12-13 MED ORDER — MORPHINE SULFATE (PF) 2 MG/ML IV SOLN: 1.0000 mg | INTRAVENOUS | Status: DC | PRN

## 2021-12-13 MED ORDER — PANTOPRAZOLE SODIUM 40 MG PO TBEC
40.0000 mg | DELAYED_RELEASE_TABLET | Freq: Every day | ORAL | Status: DC
  Administered 2021-12-16 – 2021-12-20 (×5): 40 mg via ORAL
  Filled 2021-12-13 (×5): qty 1

## 2021-12-13 MED ORDER — THROMBIN 20000 UNITS EX SOLR
CUTANEOUS | Status: DC | PRN
  Administered 2021-12-13: 20000 [IU] via TOPICAL

## 2021-12-13 MED ORDER — ASPIRIN 81 MG PO CHEW
324.0000 mg | CHEWABLE_TABLET | Freq: Every day | ORAL | Status: DC
  Administered 2021-12-15: 324 mg
  Filled 2021-12-13: qty 4

## 2021-12-13 MED ORDER — SODIUM CHLORIDE 0.9% FLUSH
3.0000 mL | Freq: Two times a day (BID) | INTRAVENOUS | Status: DC
  Administered 2021-12-14 – 2021-12-20 (×11): 3 mL via INTRAVENOUS

## 2021-12-13 SURGICAL SUPPLY — 109 items
BAG DECANTER FOR FLEXI CONT (MISCELLANEOUS) ×5 IMPLANT
BINDER BREAST 3XL (GAUZE/BANDAGES/DRESSINGS) ×1 IMPLANT
BLADE CLIPPER SURG (BLADE) ×5 IMPLANT
BLADE STERNUM SYSTEM 6 (BLADE) ×5 IMPLANT
BNDG ELASTIC 4X5.8 VLCR STR LF (GAUZE/BANDAGES/DRESSINGS) ×6 IMPLANT
BNDG ELASTIC 6X5.8 VLCR STR LF (GAUZE/BANDAGES/DRESSINGS) ×4 IMPLANT
BNDG GAUZE ELAST 4 BULKY (GAUZE/BANDAGES/DRESSINGS) ×5 IMPLANT
CANISTER SUCT 3000ML PPV (MISCELLANEOUS) ×5 IMPLANT
CATH ROBINSON RED A/P 18FR (CATHETERS) ×10 IMPLANT
CATH THORACIC 28FR (CATHETERS) ×5 IMPLANT
CATH THORACIC 36FR (CATHETERS) ×5 IMPLANT
CATH THORACIC 36FR RT ANG (CATHETERS) ×5 IMPLANT
CLIP FOGARTY SPRING 6M (CLIP) ×1 IMPLANT
CLIP VESOCCLUDE MED 24/CT (CLIP) IMPLANT
CLIP VESOCCLUDE SM WIDE 24/CT (CLIP) IMPLANT
CONN Y 3/8X3/8X3/8  BEN (MISCELLANEOUS) ×1
CONN Y 3/8X3/8X3/8 BEN (MISCELLANEOUS) IMPLANT
CONTAINER PROTECT SURGISLUSH (MISCELLANEOUS) ×10 IMPLANT
DERMABOND ADVANCED (GAUZE/BANDAGES/DRESSINGS) ×1
DERMABOND ADVANCED .7 DNX12 (GAUZE/BANDAGES/DRESSINGS) IMPLANT
DRAPE CARDIOVASCULAR INCISE (DRAPES) ×1
DRAPE SRG 135X102X78XABS (DRAPES) ×4 IMPLANT
DRAPE WARM FLUID 44X44 (DRAPES) ×5 IMPLANT
DRSG AQUACEL AG ADV 3.5X14 (GAUZE/BANDAGES/DRESSINGS) ×1 IMPLANT
DRSG COVADERM 4X14 (GAUZE/BANDAGES/DRESSINGS) ×5 IMPLANT
ELECT CAUTERY BLADE 6.4 (BLADE) ×5 IMPLANT
ELECT REM PT RETURN 9FT ADLT (ELECTROSURGICAL) ×10
ELECTRODE REM PT RTRN 9FT ADLT (ELECTROSURGICAL) ×8 IMPLANT
FELT TEFLON 1X6 (MISCELLANEOUS) ×9 IMPLANT
GAUZE 4X4 16PLY ~~LOC~~+RFID DBL (SPONGE) ×1 IMPLANT
GAUZE SPONGE 4X4 12PLY STRL (GAUZE/BANDAGES/DRESSINGS) ×10 IMPLANT
GAUZE SPONGE 4X4 12PLY STRL LF (GAUZE/BANDAGES/DRESSINGS) ×2 IMPLANT
GLOVE SURG ENC MOIS LTX SZ6 (GLOVE) IMPLANT
GLOVE SURG ENC MOIS LTX SZ6.5 (GLOVE) IMPLANT
GLOVE SURG ENC MOIS LTX SZ7 (GLOVE) IMPLANT
GLOVE SURG ENC MOIS LTX SZ7.5 (GLOVE) IMPLANT
GLOVE SURG MICRO LTX SZ6 (GLOVE) ×8 IMPLANT
GLOVE SURG MICRO LTX SZ7 (GLOVE) ×11 IMPLANT
GLOVE SURG MICRO LTX SZ7.5 (GLOVE) ×4 IMPLANT
GLOVE SURG ORTHO LTX SZ7.5 (GLOVE) IMPLANT
GLOVE SURG POLYISO LF SZ6.5 (GLOVE) ×1 IMPLANT
GLOVE SURG UNDER POLY LF SZ6 (GLOVE) IMPLANT
GLOVE SURG UNDER POLY LF SZ6.5 (GLOVE) IMPLANT
GLOVE SURG UNDER POLY LF SZ7 (GLOVE) IMPLANT
GOWN STRL REUS W/ TWL LRG LVL3 (GOWN DISPOSABLE) ×16 IMPLANT
GOWN STRL REUS W/ TWL XL LVL3 (GOWN DISPOSABLE) ×4 IMPLANT
GOWN STRL REUS W/TWL LRG LVL3 (GOWN DISPOSABLE) ×8
GOWN STRL REUS W/TWL XL LVL3 (GOWN DISPOSABLE) ×1
HEMOSTAT POWDER SURGIFOAM 1G (HEMOSTASIS) ×15 IMPLANT
HEMOSTAT SURGICEL 2X14 (HEMOSTASIS) ×5 IMPLANT
INSERT FOGARTY 61MM (MISCELLANEOUS) IMPLANT
INSERT FOGARTY XLG (MISCELLANEOUS) IMPLANT
KIT BASIN OR (CUSTOM PROCEDURE TRAY) ×5 IMPLANT
KIT CATH CPB BARTLE (MISCELLANEOUS) ×5 IMPLANT
KIT SUCTION CATH 14FR (SUCTIONS) ×5 IMPLANT
KIT TURNOVER KIT B (KITS) ×5 IMPLANT
KIT VASOVIEW HEMOPRO 2 VH 4000 (KITS) ×5 IMPLANT
NS IRRIG 1000ML POUR BTL (IV SOLUTION) ×25 IMPLANT
PACK E OPEN HEART (SUTURE) ×5 IMPLANT
PACK OPEN HEART (CUSTOM PROCEDURE TRAY) ×5 IMPLANT
PAD ARMBOARD 7.5X6 YLW CONV (MISCELLANEOUS) ×10 IMPLANT
PAD ELECT DEFIB RADIOL ZOLL (MISCELLANEOUS) ×5 IMPLANT
PENCIL BUTTON HOLSTER BLD 10FT (ELECTRODE) ×5 IMPLANT
POSITIONER HEAD DONUT 9IN (MISCELLANEOUS) ×5 IMPLANT
PUNCH AORTIC ROTATE 4.0MM (MISCELLANEOUS) IMPLANT
PUNCH AORTIC ROTATE 4.5MM 8IN (MISCELLANEOUS) ×5 IMPLANT
PUNCH AORTIC ROTATE 5MM 8IN (MISCELLANEOUS) IMPLANT
SET MPS 3-ND DEL (MISCELLANEOUS) ×1 IMPLANT
SOL PREP POV-IOD 4OZ 10% (MISCELLANEOUS) ×2 IMPLANT
SPONGE INTESTINAL PEANUT (DISPOSABLE) IMPLANT
SPONGE T-LAP 18X18 ~~LOC~~+RFID (SPONGE) ×4 IMPLANT
SPONGE T-LAP 4X18 ~~LOC~~+RFID (SPONGE) ×7 IMPLANT
SUPPORT HEART JANKE-BARRON (MISCELLANEOUS) ×5 IMPLANT
SUT BONE WAX W31G (SUTURE) ×5 IMPLANT
SUT MNCRL AB 4-0 PS2 18 (SUTURE) IMPLANT
SUT PROLENE 3 0 SH DA (SUTURE) IMPLANT
SUT PROLENE 3 0 SH1 36 (SUTURE) ×5 IMPLANT
SUT PROLENE 4 0 RB 1 (SUTURE)
SUT PROLENE 4 0 SH DA (SUTURE) IMPLANT
SUT PROLENE 4-0 RB1 .5 CRCL 36 (SUTURE) IMPLANT
SUT PROLENE 5 0 C 1 36 (SUTURE) IMPLANT
SUT PROLENE 6 0 C 1 30 (SUTURE) IMPLANT
SUT PROLENE 7 0 BV 1 (SUTURE) IMPLANT
SUT PROLENE 7 0 BV1 MDA (SUTURE) ×6 IMPLANT
SUT PROLENE 8 0 BV175 6 (SUTURE) IMPLANT
SUT SILK  1 MH (SUTURE)
SUT SILK 1 MH (SUTURE) IMPLANT
SUT SILK 2 0 SH (SUTURE) IMPLANT
SUT STEEL 6MS V (SUTURE) ×2 IMPLANT
SUT STEEL STERNAL CCS#1 18IN (SUTURE) IMPLANT
SUT STEEL SZ 6 DBL 3X14 BALL (SUTURE) IMPLANT
SUT VIC AB 1 CTX 36 (SUTURE) ×2
SUT VIC AB 1 CTX36XBRD ANBCTR (SUTURE) ×8 IMPLANT
SUT VIC AB 2-0 CT1 27 (SUTURE) ×1
SUT VIC AB 2-0 CT1 TAPERPNT 27 (SUTURE) IMPLANT
SUT VIC AB 2-0 CTX 27 (SUTURE) ×2 IMPLANT
SUT VIC AB 3-0 SH 27 (SUTURE)
SUT VIC AB 3-0 SH 27X BRD (SUTURE) IMPLANT
SUT VIC AB 3-0 X1 27 (SUTURE) ×1 IMPLANT
SUT VICRYL 4-0 PS2 18IN ABS (SUTURE) IMPLANT
SYSTEM SAHARA CHEST DRAIN ATS (WOUND CARE) ×5 IMPLANT
TAPE CLOTH SURG 4X10 WHT LF (GAUZE/BANDAGES/DRESSINGS) ×2 IMPLANT
TAPE PAPER 2X10 WHT MICROPORE (GAUZE/BANDAGES/DRESSINGS) ×1 IMPLANT
TOWEL GREEN STERILE (TOWEL DISPOSABLE) ×5 IMPLANT
TOWEL GREEN STERILE FF (TOWEL DISPOSABLE) ×5 IMPLANT
TRAY FOLEY SLVR 16FR TEMP STAT (SET/KITS/TRAYS/PACK) ×5 IMPLANT
TUBING LAP HI FLOW INSUFFLATIO (TUBING) ×5 IMPLANT
UNDERPAD 30X36 HEAVY ABSORB (UNDERPADS AND DIAPERS) ×5 IMPLANT
WATER STERILE IRR 1000ML POUR (IV SOLUTION) ×10 IMPLANT

## 2021-12-13 NOTE — Progress Notes (Signed)
°  Echocardiogram Echocardiogram Transesophageal has been performed.  Katherine Pratt 12/13/2021, 8:27 AM

## 2021-12-13 NOTE — Discharge Summary (Signed)
[address]       ,[place] [address]             [phone]    Physician Discharge Summary  Patient ID: Katherine Pratt MRN: [id number] DOB/AGE: 05/28/1951 71 y.o.  Admit date: 12/09/2021 Discharge date: 12/20/2021  Admission Diagnoses:  Patient Active Problem List   Diagnosis Date Noted   S/P CABG x 4 12/13/2021   NSTEMI (non-ST elevated myocardial infarction) ([place]) 12/09/2021   Degenerative cervical disc 11/09/2019   Adhesive capsulitis of right shoulder 09/16/2019   Right cervical radiculopathy 09/16/2019   Degenerative spinal arthritis 04/28/2018   Greater trochanteric bursitis of right hip 04/02/2017   Morbid obesity ([place]) 07/24/2016   Prepatellar bursitis, right knee 07/24/2016   Primary osteoarthritis of right knee 07/24/2016   Acute non-recurrent maxillary sinusitis 05/20/2016   Uncontrolled type 2 diabetes mellitus with diabetic polyneuropathy, without long-term current use of insulin 09/25/2015   Fatty liver 08/01/2015   HLD (hyperlipidemia) 08/01/2015   Malignant neoplasm of right female breast ([place]) 08/01/2015   Benign essential hypertension 04/14/2014     Discharge Diagnoses:  Patient Active Problem List   Diagnosis Date Noted   S/P CABG x 4 12/13/2021   NSTEMI (non-ST elevated myocardial infarction) ([place]) 12/09/2021   Degenerative cervical disc 11/09/2019   Adhesive capsulitis of right shoulder 09/16/2019   Right cervical radiculopathy 09/16/2019   Degenerative spinal arthritis 04/28/2018   Greater trochanteric bursitis of right hip 04/02/2017   Morbid obesity ([place]) 07/24/2016   Prepatellar bursitis, right knee 07/24/2016   Primary osteoarthritis of right knee 07/24/2016   Acute non-recurrent maxillary sinusitis 05/20/2016   Uncontrolled type 2 diabetes mellitus with diabetic polyneuropathy, without long-term current use of insulin 09/25/2015   Fatty liver 08/01/2015   HLD (hyperlipidemia) 08/01/2015   Malignant neoplasm of right  female breast ([place]) 08/01/2015   Benign essential hypertension 04/14/2014     Discharged Condition: good  History of Present Illness:     This is a 71 year old female with a past medical history of obesity, hyperlipidemia, hypertension, diabetes mellitus, right breast cancer (lumpectomy and radiation) who presented initially to [facility] ED [facility] with complaints of chest pain. She reported intermittent chest pain ("in my heart, not my chest") that was stabbing like and fairly severe on and off throughout the night 12/08/2021.  Per medical record, she sat on the side of the bed and rocked. Her left arm and shoulder hurt and she was sweating a lot. She denied shortness of breath, nausea, vomiting. She took a Pepcid and was able to get some sleep. On 12/09/2021, she took the trash out and had sharp chest pain (not as bad as the night before). She felt weak and thought her legs were going to give out. Of note, she had COVID booster on Thursday 12/29. She called a friend to take her to the [facility] ED. She ruled in for a NSTEMI. She was put on a Heparin drip. She was transferred to [facility] for further evaluation and treatment. She underwent a cardiac catheterization on 01/04. Results showed LVEF 50-55% and severe multivessel CAD (please see specific results below). At the time of my exam, she is eating lunch. She denies chest pain or shortness of breath.  Following full review of the patient's studies and evaluation of the patient's status Dr. Cyndia Bent recommended proceeding with CABG.  Hospital course:  The patient was felt to be medically stable to  proceed and on 12/13/2021 patient was taken the operating room and underwent coronary artery bypass grafting x4.  Patient tolerated procedure well was taken to the surgical intensive care unit in stable condition. She was extubated the evening of surgery. Swan and a line were removed on post op day one. She remained in sinus rhythm and on no  drips. Chest tubes and wires were removed on post op day two.  She was transferred to [place] for continued post surgical rehabilitation.  She has continued to make very steady progress.  She has maintained normal sinus rhythm.  She has had some hypertension and Norvasc has been resumed at home dose when she is also on lisinopril.  Oxygen has been weaned and she maintains good saturations on room air.  Incisions are healing well without evidence of infection.  She is tolerating diet.  Blood sugars are under adequate control and she will be on home medication regimen at time of discharge with the addition of Jardiance.  Incisions are all noted to be healing well without evidence of infection.  Activity level continues to improve over time as does strength and endurance with ambulation.  She does have some shortness of breath that she attributes to facemask when she walks.  This is improving with time.  Overall, at the time of discharge the patient is felt to be quite stable.  Consults: cardiology  Significant Diagnostic Studies:  DG Chest 2 View  Result Date: 12/09/2021 CLINICAL DATA:  Central chest pain since yesterday EXAM: CHEST - 2 VIEW COMPARISON:  06/01/2019 FINDINGS: The heart size and mediastinal contours are within normal limits. Both lungs are clear. The visualized skeletal structures are unremarkable. IMPRESSION: No active cardiopulmonary disease. Electronically Signed   By: Randa Ngo M.D.   On: 12/09/2021 19:51   CT Angio Chest PE W and/or Wo Contrast  Result Date: 12/09/2021 CLINICAL DATA:  Pulmonary embolism (PE) suspected, positive D-dimer EXAM: CT ANGIOGRAPHY CHEST WITH CONTRAST TECHNIQUE: Multidetector CT imaging of the chest was performed using the standard protocol during bolus administration of intravenous contrast. Multiplanar CT image reconstructions and MIPs were obtained to evaluate the vascular anatomy. CONTRAST:  17mL OMNIPAQUE IOHEXOL 350 MG/ML SOLN COMPARISON:  None.  FINDINGS: Cardiovascular: Satisfactory opacification of the pulmonary arteries to the segmental level. No evidence of pulmonary embolism. Normal heart size. No significant pericardial effusion. The thoracic aorta is normal in caliber. Mild atherosclerotic plaque of the thoracic aorta. Coronary artery calcifications. Mediastinum/Nodes: Borderline enlarged 1 cm left axillary lymph node. Otherwise asymmetrically prominent but nonenlarged left axillary lymph nodes. No enlarged mediastinal, hilar, or axillary lymph nodes. Thyroid gland, trachea, and esophagus demonstrate no significant findings. Lungs/Pleura: No focal consolidation. No pulmonary nodule. No pulmonary mass. No pleural effusion. No pneumothorax. Upper Abdomen: No acute abnormality.  Likely splenule (4:97). Musculoskeletal: No chest wall abnormality. No suspicious lytic or blastic osseous lesions. No acute displaced fracture. Multilevel degenerative changes of the spine. Degenerative changes of the left shoulder. Review of the MIP images confirms the above findings. IMPRESSION: 1. No pulmonary embolus. 2. No acute intrathoracic abnormality. 3. Borderline enlarged 1 cm left axillary lymph node. Otherwise asymmetrically prominent but nonenlarged left axillary lymph nodes. Correlate with recent left-sided COVID vaccine or with diagnostic mammography if no recent left-sided COVID vaccine. 4.  Aortic Atherosclerosis (ICD10-I70.0). Electronically Signed   By: Iven Finn M.D.   On: 12/09/2021 21:16   CARDIAC CATHETERIZATION  Result Date: 12/11/2021   Prox RCA lesion is 95% stenosed.   Mid  RCA lesion is 70% stenosed.   Dist RCA lesion is 50% stenosed.   1st RPL lesion is 80% stenosed.   1st Mrg-1 lesion is 90% stenosed.   1st Mrg-2 lesion is 80% stenosed.   3rd Mrg lesion is 95% stenosed.   Mid LAD-1 lesion is 90% stenosed.   Prox LAD to Mid LAD lesion is 50% stenosed.   Mid LAD-2 lesion is 30% stenosed.   Dist LAD-2 lesion is 40% stenosed.   Dist LAD-1  lesion is 50% stenosed.   The left ventricular systolic function is normal.   LV end diastolic pressure is normal.   The left ventricular ejection fraction is 50-55% by visual estimate. Severe multivessel CAD with coronary calcification and high-grade stenoses in all 3 major coronary arteries. Preserved global LV contractility with EF estimate at 55% with subtle mid anterolateral hypocontractility.  LVEDP 15 mmHg. RECOMMENDATION: Surgical consultation for consideration of CABG revascularization.  Increase anti-ischemic medical regimen.  Aggressive lipid-lowering therapy.   DG Chest Port 1 View  Result Date: 12/15/2021 CLINICAL DATA:  71 year old female status post CABG postoperative day 2. EXAM: PORTABLE CHEST 1 VIEW COMPARISON:  Portable chest 12/14/2021 and earlier. FINDINGS: Portable AP semi upright view at 0526 hours. Swan-Ganz catheter removed. Right IJ introducer sheath remains. Mediastinal and left chest tube remain. Continued low lung volumes. Stable cardiac size and mediastinal contours. Sequelae of CABG. No pneumothorax. No pulmonary edema, pulmonary vascularity appears stable to mildly improved. No definite pleural effusion. Perihilar and lung base atelectasis greater on the left. Relatively normal appearing gas in the stomach. Stable visualized osseous structures. IMPRESSION: 1. Swan-Ganz catheter removed. Otherwise stable lines and tubes. 2. Atelectasis.  No pneumothorax or pulmonary edema. Electronically Signed   By: Genevie Ann M.D.   On: 12/15/2021 07:54   DG Chest Port 1 View  Result Date: 12/14/2021 CLINICAL DATA:  In follow-up CABG surgery. EXAM: PORTABLE CHEST 1 VIEW COMPARISON:  12/13/2021 and older studies. FINDINGS: Cardiac silhouette is stable.  No mediastinal widening. Left greater than right lung base opacities are noted consistent with atelectasis, unchanged from the previous day's study. Remainder of the lungs is clear. No pneumothorax. Endotracheal tube and nasal/orogastric tube have  been removed. Right internal jugular Swan-Ganz catheter, mediastinal tube and left chest tube are stable. IMPRESSION: 1. No acute findings or evidence of an operative complication. 2. Status post extubation. 3. Left greater than right lung base opacities are stable consistent with atelectasis. No evidence of pulmonary edema. No pneumothorax or mediastinal widening. Electronically Signed   By: Lajean Manes M.D.   On: 12/14/2021 08:28   DG Chest Port 1 View  Result Date: 12/13/2021 CLINICAL DATA:  Status post CABG EXAM: PORTABLE CHEST 1 VIEW COMPARISON:  Chest two views 12/09/2021 FINDINGS: Interval median sternotomy. Right internal jugular approach Swan-Ganz catheter with tip overlying the pulmonary artery outflow tract, new from prior. New endotracheal tube with tip terminating approximately 2.9 cm above the carina. Enteric tube descends below the diaphragm with the tip excluded by collimation. Left sided chest tube with tip overlying the superolateral aspect of the left hemithorax. Mildly decreased lung volumes. No large pleural effusion is seen. No pneumothorax. IMPRESSION:: IMPRESSION: 1. Interval median sternotomy for CABG. 2. Support apparatus as above, in appropriate position. 3. No acute lung process. Electronically Signed   By: Yvonne Kendall   On: 12/13/2021 14:06   ECHOCARDIOGRAM COMPLETE  Result Date: 12/11/2021    ECHOCARDIOGRAM REPORT   Patient Name:   PAMMY VESEY Date of  Exam: 12/11/2021 Medical Rec #:  [id number]    Height:       65.0 in Accession #:    [id number]   Weight:       238.9 lb Date of Birth:  [birth year]   BSA:          2.133 m Patient Age:    1 years     BP:           139/71 mmHg Patient Gender: F            HR:           61 bpm. Exam Location:  Inpatient Procedure: 2D Echo, Cardiac Doppler and Color Doppler Indications:    NSTEMI I21.4  History:        Patient has no prior history of Echocardiogram examinations.                 Risk Factors:Diabetes, Dyslipidemia and Hypertension.   Sonographer:    Bernadene Person RDCS Referring Phys: [address] [place]  1. Aortic valve appears calcified with indeterminant number of cusps; no AS by doppler.  2. Left ventricular ejection fraction, by estimation, is 55 to 60%. The left ventricle has normal function. The left ventricle has no regional wall motion abnormalities. There is mild left ventricular hypertrophy. Left ventricular diastolic parameters are indeterminate.  3. Right ventricular systolic function is normal. The right ventricular size is normal. There is normal pulmonary artery systolic pressure.  4. The mitral valve is normal in structure. Trivial mitral valve regurgitation. No evidence of mitral stenosis.  5. The aortic valve is normal in structure. Aortic valve regurgitation is not visualized. No aortic stenosis is present.  6. The inferior vena cava is dilated in size with >50% respiratory variability, suggesting right atrial pressure of 8 mmHg. Comparison(s): No prior Echocardiogram. FINDINGS  Left Ventricle: Left ventricular ejection fraction, by estimation, is 55 to 60%. The left ventricle has normal function. The left ventricle has no regional wall motion abnormalities. The left ventricular internal cavity size was normal in size. There is  mild left ventricular hypertrophy. Left ventricular diastolic parameters are indeterminate. Right Ventricle: The right ventricular size is normal. Right ventricular systolic function is normal. There is normal pulmonary artery systolic pressure. The tricuspid regurgitant velocity is 2.24 m/s, and with an assumed right atrial pressure of 8 mmHg,  the estimated right ventricular systolic pressure is 41.9 mmHg. Left Atrium: Left atrial size was normal in size. Right Atrium: Right atrial size was normal in size. Pericardium: There is no evidence of pericardial effusion. Mitral Valve: The mitral valve is normal in structure. Trivial mitral valve regurgitation. No evidence of mitral valve  stenosis. Tricuspid Valve: The tricuspid valve is normal in structure. Tricuspid valve regurgitation is trivial. No evidence of tricuspid stenosis. Aortic Valve: The aortic valve is normal in structure. Aortic valve regurgitation is not visualized. No aortic stenosis is present. Pulmonic Valve: The pulmonic valve was not well visualized. Pulmonic valve regurgitation is not visualized. No evidence of pulmonic stenosis. Aorta: The aortic root is normal in size and structure. Venous: The inferior vena cava is dilated in size with greater than 50% respiratory variability, suggesting right atrial pressure of 8 mmHg. IAS/Shunts: No atrial level shunt detected by color flow Doppler. Additional Comments: Aortic valve appears calcified with indeterminant number of cusps; no AS by doppler.  LEFT VENTRICLE PLAX 2D LVIDd:         3.50 cm   Diastology LVIDs:  2.70 cm   LV e' medial:    6.58 cm/s LV PW:         1.10 cm   LV E/e' medial:  15.0 LV IVS:        1.20 cm   LV e' lateral:   9.23 cm/s LVOT diam:     1.90 cm   LV E/e' lateral: 10.7 LV SV:         86 LV SV Index:   41 LVOT Area:     2.84 cm  RIGHT VENTRICLE RV S prime:     10.80 cm/s TAPSE (M-mode): 2.4 cm LEFT ATRIUM             Index        RIGHT ATRIUM           Index LA diam:        3.40 cm 1.59 cm/m   RA Area:     15.40 cm LA Vol (A2C):   42.6 ml 19.97 ml/m  RA Volume:   38.30 ml  17.95 ml/m LA Vol (A4C):   52.2 ml 24.47 ml/m LA Biplane Vol: 48.4 ml 22.69 ml/m  AORTIC VALVE LVOT Vmax:   137.00 cm/s LVOT Vmean:  84.400 cm/s LVOT VTI:    0.305 m  AORTA Ao Root diam: 2.90 cm Ao Asc diam:  2.90 cm MITRAL VALVE                TRICUSPID VALVE MV Area (PHT): 2.38 cm     TR Peak grad:   20.1 mmHg MV Decel Time: 319 msec     TR Vmax:        224.00 cm/s MV E velocity: 98.50 cm/s MV A velocity: 111.00 cm/s  SHUNTS MV E/A ratio:  0.89         Systemic VTI:  0.30 m                             Systemic Diam: 1.90 cm Kirk Ruths MD Electronically signed by Kirk Ruths MD Signature Date/Time: 12/11/2021/4:13:03 PM    Final    VAS US DOPPLER PRE CABG  Result Date: 12/12/2021 PREOPERATIVE VASCULAR EVALUATION Patient Name:  MAGEN SURIANO  Date of Exam:   12/12/2021 Medical Rec #: [id number]     Accession #:    [id number] Date of Birth: Feb 05, 1951    Patient Gender: F Patient Age:   29 years Exam Location:  [facility] Procedure:      VAS US DOPPLER PRE CABG Referring Phys: Gilford Raid --------------------------------------------------------------------------------  Indications:      Pre-CABG. Risk Factors:     Hypertension, hyperlipidemia, Diabetes. Comparison Study: no prior Performing Technologist: Archie Patten RVS  Examination Guidelines: A complete evaluation includes B-mode imaging, spectral Doppler, color Doppler, and power Doppler as needed of all accessible portions of each vessel. Bilateral testing is considered an integral part of a complete examination. Limited examinations for reoccurring indications may be performed as noted.  Right Carotid Findings: +----------+--------+--------+--------+------------+--------+             PSV cm/s EDV cm/s Stenosis Describe     Comments  +----------+--------+--------+--------+------------+--------+  CCA Prox   72       14                heterogenous           +----------+--------+--------+--------+------------+--------+  CCA Distal 60       14  heterogenous           +----------+--------+--------+--------+------------+--------+  ICA Prox   84       27       1-39%    heterogenous           +----------+--------+--------+--------+------------+--------+  ICA Distal 80       17                                       +----------+--------+--------+--------+------------+--------+  ECA        71       7                                        +----------+--------+--------+--------+------------+--------+ +----------+--------+-------+--------+------------+             PSV cm/s EDV cms Describe Arm Pressure   +----------+--------+-------+--------+------------+  Subclavian 109                                     +----------+--------+-------+--------+------------+ +---------+--------+--+--------+--+---------+  Vertebral PSV cm/s 94 EDV cm/s 24 Antegrade  +---------+--------+--+--------+--+---------+ Left Carotid Findings: +----------+--------+--------+--------+------------+--------+             PSV cm/s EDV cm/s Stenosis Describe     Comments  +----------+--------+--------+--------+------------+--------+  CCA Prox   68       12                heterogenous           +----------+--------+--------+--------+------------+--------+  CCA Distal 70       16                heterogenous           +----------+--------+--------+--------+------------+--------+  ICA Prox   56       20       1-39%    heterogenous           +----------+--------+--------+--------+------------+--------+  ICA Distal 52       16                                       +----------+--------+--------+--------+------------+--------+  ECA        61                                                +----------+--------+--------+--------+------------+--------+ +----------+--------+--------+--------+------------+  Subclavian PSV cm/s EDV cm/s Describe Arm Pressure  +----------+--------+--------+--------+------------+             127                                      +----------+--------+--------+--------+------------+ +---------+--------+--+--------+--+---------+  Vertebral PSV cm/s 52 EDV cm/s 17 Antegrade  +---------+--------+--+--------+--+---------+  ABI Findings: +--------+------------------+-----+---------+--------------------+  Right    Rt Pressure (mmHg) Index Waveform  Comment               +--------+------------------+-----+---------+--------------------+  Brachial  triphasic restricted extremity  +--------+------------------+-----+---------+--------------------+  PTA      174                1.18  triphasic                        +--------+------------------+-----+---------+--------------------+  DP       172                1.17  triphasic                       +--------+------------------+-----+---------+--------------------+ +--------+------------------+-----+---------+-------+  Left     Lt Pressure (mmHg) Index Waveform  Comment  +--------+------------------+-----+---------+-------+  Brachial 147                      triphasic          +--------+------------------+-----+---------+-------+  PTA      144                0.98  triphasic          +--------+------------------+-----+---------+-------+  DP       166                1.13  triphasic          +--------+------------------+-----+---------+-------+ +-------+---------------+----------------+  ABI/TBI Today's ABI/TBI Previous ABI/TBI  +-------+---------------+----------------+  Right   1.18                              +-------+---------------+----------------+  Left    1.13                              +-------+---------------+----------------+  Right Doppler Findings: +--------+--------+-----+---------+--------------------+  Site     Pressure Index Doppler   Comments              +--------+--------+-----+---------+--------------------+  Brachial                triphasic restricted extremity  +--------+--------+-----+---------+--------------------+  Radial                  triphasic                       +--------+--------+-----+---------+--------------------+  Ulnar                   triphasic                       +--------+--------+-----+---------+--------------------+  Left Doppler Findings: +--------+--------+-----+---------+--------+  Site     Pressure Index Doppler   Comments  +--------+--------+-----+---------+--------+  Brachial 147            triphasic           +--------+--------+-----+---------+--------+  Radial                  triphasic           +--------+--------+-----+---------+--------+  Ulnar                   triphasic            +--------+--------+-----+---------+--------+  Summary: Right Carotid: Velocities in the right ICA are consistent with a 1-39% stenosis. Left Carotid: Velocities in the left ICA are consistent with a 1-39% stenosis. Vertebrals: Bilateral vertebral arteries demonstrate antegrade flow. Right ABI: Resting right ankle-brachial index is  within normal range. No evidence of significant right lower extremity arterial disease. Left ABI: Resting left ankle-brachial index is within normal range. No evidence of significant left lower extremity arterial disease. Right Upper Extremity: Doppler waveforms remain within normal limits with right radial compression. Doppler waveforms remain within normal limits with right ulnar compression. Left Upper Extremity: Doppler waveforms remain within normal limits with left radial compression. Doppler waveforms remain within normal limits with left ulnar compression.  Electronically signed by Jamelle Haring on 12/12/2021 at 4:59:41 PM.    Final       Treatments: surgery:  /05/2022   Surgeon:  Gaye Pollack, MD   First Assistant: Jadene Pierini,  PA-C:   An experienced assistant was required given the complexity of this surgery and the standard of surgical care. The assistant was needed for exposure, dissection, suctioning, retraction of delicate tissues and sutures, instrument exchange and for overall help during this procedure.     Preoperative Diagnosis:  Severe multi-vessel coronary artery disease     Postoperative Diagnosis:  Same     Procedure:   Median Sternotomy Extracorporeal circulation 3.   Coronary artery bypass grafting x 4   Left internal mammary artery graft to the LAD SVG to OM1 SVG to OM2 SVG to PDA 4.   Endoscopic vein harvest from the right leg     Anesthesia:  General Endotracheal    Discharge Exam: Blood pressure (!) 165/65, pulse 72, temperature 98.4 F (36.9 C), temperature source Oral, resp. rate (!) 25, height 5\' 5"  (1.651 m), weight 112  kg, SpO2 98 %.  General appearance: alert, cooperative, and no distress Heart: regular rate and rhythm and occas extrasystole Lungs: min dim in bases Abdomen: benign Extremities: + LE edema Wound: incis healing well  Discharge Medications:  The patient has been discharged on:   1.Beta Blocker:  Yes Kaito.Grams   ]                              No   [   ]                              If No, reason:  2.Ace Inhibitor/ARB: Yes [ y  ]                                     No  [    ]                                     If No, reason:  3.Statin:   Yes [ y  ]                  No  [   ]                  If No, reason:  4.Ecasa:  Yes  [   y]                  No   [   ]                  If No, reason:  Patient had ACS upon admission:  Plavix/P2Y12 inhibitor: Yes [  y ]  No  [   ]     Discharge Instructions     Discharge patient   Complete by: As directed    Discharge disposition: 01-Home or Self Care   Discharge patient date: 12/20/2021      Allergies as of 12/20/2021       Reactions   Aspirin Other (See Comments)   On meloxicam   Keflex [cephalexin] Hives   Losartan Rash        Medication List     STOP taking these medications    Accu-Chek Guide test strip Generic drug: glucose blood   BC HEADACHE POWDER PO   ibuprofen 200 MG tablet Commonly known as: ADVIL       TAKE these medications    amLODipine 10 MG tablet Commonly known as: NORVASC Take 10 mg by mouth daily.   aspirin 81 MG EC tablet Take 1 tablet (81 mg total) by mouth daily. Swallow whole.   atorvastatin 80 MG tablet Commonly known as: LIPITOR Take 1 tablet (80 mg total) by mouth daily. What changed:  medication strength how much to take   clopidogrel 75 MG tablet Commonly known as: PLAVIX Take 1 tablet (75 mg total) by mouth daily.   empagliflozin 10 MG Tabs tablet Commonly known as: JARDIANCE Take 1 tablet (10 mg total) by mouth daily.    furosemide 40 MG tablet Commonly known as: LASIX Take 1 tablet (40 mg total) by mouth daily.   lisinopril 20 MG tablet Commonly known as: ZESTRIL Take 1 tablet (20 mg total) by mouth daily.   metoprolol tartrate 25 MG tablet Commonly known as: LOPRESSOR Take 0.5 tablets (12.5 mg total) by mouth 2 (two) times daily.   oxyCODONE 5 MG immediate release tablet Commonly known as: Oxy IR/ROXICODONE Take 1 tablet (5 mg total) by mouth every 6 (six) hours as needed for up to 7 days for severe pain.   polyvinyl alcohol 1.4 % ophthalmic solution Commonly known as: LIQUIFILM TEARS Place 1 drop into both eyes as needed for dry eyes.   potassium chloride SA 20 MEQ tablet Commonly known as: KLOR-CON M Take 1 tablet (20 mEq total) by mouth daily.   Trulicity 3 YY/4.8GN Sopn Generic drug: Dulaglutide Inject 3 mg into the skin once a week.               Durable Medical Equipment  (From admission, onward)           Start     Ordered   12/17/21 0809  For home use only DME Walker rolling  Once       Question Answer Comment  Walker: With 5 Inch Wheels   Patient needs a walker to treat with the following condition S/P CABG (coronary artery bypass graft)      12/17/21 [place] Follow up.   Why: rolling walker arranged- to be delivered to room prior to discharge Contact information: [place] [address] [phone]                 Signed: John Giovanni, PA-C  12/20/2021, 8:51 AM

## 2021-12-13 NOTE — Op Note (Signed)
CARDIOVASCULAR SURGERY OPERATIVE NOTE  12/13/2021  Surgeon:  Gaye Pollack, MD  First Assistant: Jadene Pierini,  PA-C:   An experienced assistant was required given the complexity of this surgery and the standard of surgical care. The assistant was needed for exposure, dissection, suctioning, retraction of delicate tissues and sutures, instrument exchange and for overall help during this procedure.   Preoperative Diagnosis:  Severe multi-vessel coronary artery disease   Postoperative Diagnosis:  Same   Procedure:  Median Sternotomy Extracorporeal circulation 3.   Coronary artery bypass grafting x 4  Left internal mammary artery graft to the LAD SVG to OM1 SVG to OM2 SVG to PDA 4.   Endoscopic vein harvest from the right leg   Anesthesia:  General Endotracheal   Clinical History/Surgical Indication:  This 71 year old woman presented with recent onset of substernal chest pain with exertion and ruled in for an NSTEMI.  Cardiac catheterization shows severe three-vessel coronary disease with significant proximal coronary calcification and high-grade stenoses in all 3 coronary territories.  Left ventricular ejection fraction is 55 to 60%.  There is trivial mitral regurgitation and no aortic stenosis or insufficiency.  She has a history of breast cancer with right breast lumpectomy and radiation therapy at age 58.  I agree that with diabetes and severe three-vessel coronary disease with significant coronary calcification that coronary bypass graft surgery is the best treatment for her. I discussed the operative procedure with the patient and her sister and brother-in-law including alternatives, benefits and risks; including but not limited to bleeding, blood transfusion, infection, stroke, myocardial infarction, graft failure, heart block requiring a permanent pacemaker, organ dysfunction, and death.   Arlyce Dice understands and agrees to proceed.   Preparation:  The patient was seen in the preoperative holding area and the correct patient, correct operation were confirmed with the patient after reviewing the medical record and catheterization. The consent was signed by me. Preoperative antibiotics were given. A pulmonary arterial line and radial arterial line were placed by the anesthesia team. The patient was taken back to the operating room and positioned supine on the operating room table. After being placed under general endotracheal anesthesia by the anesthesia team a foley catheter was placed. The neck, chest, abdomen, and both legs were prepped with betadine soap and solution and draped in the usual sterile manner. A surgical time-out was taken and the correct patient and operative procedure were confirmed with the nursing and anesthesia staff.   Cardiopulmonary Bypass:  A median sternotomy was performed. The pericardium was opened in the midline. Right ventricular function appeared normal. The ascending aorta was of normal size and had no palpable plaque. There were no contraindications to aortic cannulation or cross-clamping. The patient was fully systemically heparinized and the ACT was maintained > 400 sec. The proximal aortic arch was cannulated with a 20 F aortic cannula for arterial inflow. Venous cannulation was performed via the right atrial appendage using a two-staged venous cannula. An antegrade cardioplegia/vent cannula was inserted into the mid-ascending aorta. Aortic occlusion was performed with a single cross-clamp. Systemic cooling to 32 degrees Centigrade and topical cooling of the heart with iced saline were used. Hyperkalemic antegrade cold blood cardioplegia was used to induce diastolic arrest and was then given at about 20 minute intervals throughout the period of arrest to maintain myocardial temperature at or below 10 degrees centigrade. A temperature probe was inserted  into the interventricular septum and an insulating pad was placed in the pericardium.   Left internal mammary  artery harvest:  The left side of the sternum was retracted using the Rultract retractor. The left internal mammary artery was harvested as a pedicle graft. All side branches were clipped. It was a medium-sized vessel of good quality with excellent blood flow. It was ligated distally and divided. It was sprayed with topical papaverine solution to prevent vasospasm.   Endoscopic vein harvest:  The right greater saphenous vein was harvested endoscopically through a 2 cm incision medial to the right knee. It was harvested from the upper thigh to below the knee. It was a medium-sized vein of good quality. The side branches were all ligated with 4-0 silk ties.    Coronary arteries:  The coronary arteries were examined.  LAD:  Large vessels with diffuse segmental distal disease LCX:  OM1 is a medium caliber vessel with no distal disease. OM2 or distal LCX is small but graftable with no distal disease. RCA:  diffusely diseased. The PDA was graftable distally but small. The PL branches were very small and not graftable.   Grafts:  LIMA to the LAD: 2.5 mm. It was sewn end to side using 8-0 prolene continuous suture. SVG to OM1:  1.75 mm. It was sewn end to side using 7-0 prolene continuous suture. SVG to OM2:  1.5 mm. It was sewn end to side using 7-0 prolene continuous suture. SVG to PDA:  1.5 mm. It was sewn end to side using 7-0 prolene continuous suture.  The proximal vein graft anastomoses were performed to the mid-ascending aorta using continuous 6-0 prolene suture. Graft markers were placed around the proximal anastomoses.   Completion:  The patient was rewarmed to 37 degrees Centigrade. The clamp was removed from the LIMA pedicle and there was rapid warming of the septum and return of ventricular fibrillation. The crossclamp was removed with a time of 89 minutes. There was  spontaneous return of sinus rhythm. The distal and proximal anastomoses were checked for hemostasis. The position of the grafts was satisfactory. Two temporary epicardial pacing wires were placed on the right atrium and two on the right ventricle. The patient was weaned from CPB without difficulty on no inotropes. CPB time was 105 minutes. Cardiac output was 4.5 LPM. TEE showed normal LV systolic function. Heparin was fully reversed with protamine and the aortic and venous cannulas removed. Hemostasis was achieved. Mediastinal and left pleural drainage tubes were placed. The sternum was closed with #6 stainless steel wires. The fascia was closed with continuous # 1 vicryl suture. The subcutaneous tissue was closed with 2-0 vicryl continuous suture. The skin was closed with 3-0 vicryl subcuticular suture. All sponge, needle, and instrument counts were reported correct at the end of the case. Dry sterile dressings were placed over the incisions and around the chest tubes which were connected to pleurevac suction. The patient was then transported to the surgical intensive care unit in stable condition.

## 2021-12-13 NOTE — Plan of Care (Signed)

## 2021-12-13 NOTE — Procedures (Signed)
Extubation Procedure Note  Patient Details:   Name: Katherine Pratt DOB: [birth year] MRN: [id number]   Airway Documentation:    Vent end date: 12/13/21 Vent end time: 1845   Evaluation  O2 sats: stable throughout Complications: No apparent complications Patient did tolerate procedure well. Bilateral Breath Sounds: Clear, Diminished   Pt extubated to 4L [place] per rapid wean protocol. NIF -35 and VC 659ml. Pt had positive cuff leak prior to extubation. No stridor noted. Pt able to voice her name.  [name] 12/13/2021, 6:46 PM

## 2021-12-13 NOTE — Progress Notes (Signed)
Spoke with on call TCTS physician Dr. Kipp Brood via telephone regarding pt ABG results pre-extubation Ph 7.29 Co2 44 pO2-85 hCO3 21.3 S02- 95 Telephone order given to give 1 amp of sodium bicarb and continue with planned extubated.

## 2021-12-13 NOTE — Interval H&P Note (Signed)
History and Physical Interval Note:  12/13/2021 6:56 AM  Katherine Pratt  has presented today for surgery, with the diagnosis of CAD.  The various methods of treatment have been discussed with the patient and family. After consideration of risks, benefits and other options for treatment, the patient has consented to  Procedure(s): CORONARY ARTERY BYPASS GRAFTING (CABG) (N/A) TRANSESOPHAGEAL ECHOCARDIOGRAM (TEE) (N/A) as a surgical intervention.  The patient's history has been reviewed, patient examined, no change in status, stable for surgery.  I have reviewed the patient's chart and labs.  Questions were answered to the patient's satisfaction.     Gaye Pollack

## 2021-12-13 NOTE — Brief Op Note (Signed)
12/09/2021 - 12/13/2021  1:03 PM  PATIENT:  Katherine Pratt  71 y.o. female  PRE-OPERATIVE DIAGNOSIS:  CORONARY ARTERY DISEASE  POST-OPERATIVE DIAGNOSIS:  CORONARY ARTERY DISEASE  PROCEDURE:  Procedure(s): CORONARY ARTERY BYPASS GRAFTING (CABG) X  4  , ON PUMP, USING LEFT INTERNAL MAMMARY ARTERY AND RIGHT ENDOSCOPIC GREATER SAPHENOUS VEIN CONDUITS (N/A) TRANSESOPHAGEAL ECHOCARDIOGRAM (TEE) (N/A) ENDOVEIN HARVEST OF GREATER SAPHENOUS VEIN (Right) APPLICATION OF CELL SAVER LIMA-LAD SVG-OM1 SVG-OM2 SVG-PD EVH 45/20  SURGEON:  Surgeon(s) and Role:    * Bartle, Fernande Boyden, MD - Primary  PHYSICIAN ASSISTANT: Shavona Gunderman PA-C  ASSISTANTS: STAFF   ANESTHESIA:   general  EBL:  700 mL   BLOOD ADMINISTERED:none  DRAINS:  LEFT PLEURAL AND MEDIASTINAL CHEST TUBES    LOCAL MEDICATIONS USED:  NONE  SPECIMEN:  No Specimen  DISPOSITION OF SPECIMEN:  N/A  COUNTS:  YES  TOURNIQUET:  * No tourniquets in log *  DICTATION: .Dragon Dictation  PLAN OF CARE: Admit to inpatient   PATIENT DISPOSITION:  ICU - intubated and hemodynamically stable.   Delay start of Pharmacological VTE agent (>24hrs) due to surgical blood loss or risk of bleeding: yes  COMPLICATIONS: NO KNOWN

## 2021-12-13 NOTE — Anesthesia Procedure Notes (Signed)
Central Venous Catheter Insertion Performed by: Oleta Mouse, MD, anesthesiologist Start/End1/05/2022 7:03 AM, 12/13/2021 7:13 AM Patient location: Pre-op. Preanesthetic checklist: patient identified, IV checked, site marked, risks and benefits discussed, surgical consent, monitors and equipment checked, pre-op evaluation, timeout performed and anesthesia consent Position: Trendelenburg Lidocaine 1% used for infiltration and patient sedated Hand hygiene performed  and maximum sterile barriers used  Catheter size: 8.5 Fr Sheath introducer Procedure performed using ultrasound guided technique. Ultrasound Notes:anatomy identified, needle tip was noted to be adjacent to the nerve/plexus identified, no ultrasound evidence of intravascular and/or intraneural injection and image(s) printed for medical record Attempts: 1 Following insertion, line sutured, dressing applied and Biopatch. Post procedure assessment: blood return through all ports, free fluid flow and no air  Patient tolerated the procedure well with no immediate complications.

## 2021-12-13 NOTE — Anesthesia Preprocedure Evaluation (Addendum)
Anesthesia Evaluation  Patient identified by MRN, date of birth, ID band Patient awake    Reviewed: Allergy & Precautions, NPO status , Patient's Chart, lab work & pertinent test results  History of Anesthesia Complications Negative for: history of anesthetic complications  Airway Mallampati: III  TM Distance: >3 FB Neck ROM: Full    Dental  (+) Dental Advisory Given, Chipped, Teeth Intact,    Pulmonary neg pulmonary ROS,    breath sounds clear to auscultation       Cardiovascular hypertension, Pt. on medications (-) angina+ CAD and + Past MI  (-) dysrhythmias  Rhythm:Regular     Neuro/Psych neg Seizures  Neuromuscular disease    GI/Hepatic negative GI ROS, Neg liver ROS,   Endo/Other  diabetes, Type 2Morbid obesity  Renal/GU negative Renal ROS     Musculoskeletal  (+) Arthritis ,   Abdominal   Peds  Hematology   Anesthesia Other Findings   Reproductive/Obstetrics                            Anesthesia Physical Anesthesia Plan  ASA: 4  Anesthesia Plan: General   Post-op Pain Management:    Induction: Intravenous  PONV Risk Score and Plan: 3 and Treatment may vary due to age or medical condition  Airway Management Planned: Oral ETT  Additional Equipment: Arterial line, CVP, PA Cath, TEE and Ultrasound Guidance Line Placement  Intra-op Plan:   Post-operative Plan: Post-operative intubation/ventilation  Informed Consent: I have reviewed the patients History and Physical, chart, labs and discussed the procedure including the risks, benefits and alternatives for the proposed anesthesia with the patient or authorized representative who has indicated his/her understanding and acceptance.     Dental advisory given  Plan Discussed with: CRNA and Anesthesiologist  Anesthesia Plan Comments:         Anesthesia Quick Evaluation

## 2021-12-13 NOTE — Anesthesia Procedure Notes (Signed)
Arterial Line Insertion Start/End1/05/2022 6:50 AM, 12/13/2021 6:53 AM Performed by: Lorie Phenix, CRNA, CRNA  Patient location: Pre-op. Preanesthetic checklist: patient identified, IV checked, site marked, risks and benefits discussed, surgical consent, monitors and equipment checked, pre-op evaluation, timeout performed and anesthesia consent Lidocaine 1% used for infiltration Left, radial was placed Catheter size: 20 G Hand hygiene performed  and maximum sterile barriers used  Allen's test indicative of satisfactory collateral circulation Attempts: 1 Procedure performed without using ultrasound guided technique. Following insertion, dressing applied and Biopatch. Post procedure assessment: normal  Patient tolerated the procedure well with no immediate complications.

## 2021-12-13 NOTE — Anesthesia Procedure Notes (Signed)
Central Venous Catheter Insertion Performed by: Oleta Mouse, MD, anesthesiologist Start/End1/05/2022 7:03 AM, 12/13/2021 7:13 AM Patient location: Pre-op. Preanesthetic checklist: patient identified, IV checked, site marked, risks and benefits discussed, surgical consent, monitors and equipment checked, pre-op evaluation, timeout performed and anesthesia consent Maximum sterile barriers used  PA cath was placed.Swan type:thermodilution Procedure performed without using ultrasound guided technique. Attempts: 1 Patient tolerated the procedure well with no immediate complications.

## 2021-12-13 NOTE — Progress Notes (Signed)
EVENING ROUNDS NOTE :     [address]       [place] [address]             [phone]                 Day of Surgery Procedure(s) (LRB): CORONARY ARTERY BYPASS GRAFTING (CABG) X  4  , ON PUMP, USING LEFT INTERNAL MAMMARY ARTERY AND RIGHT ENDOSCOPIC GREATER SAPHENOUS VEIN CONDUITS (N/A) TRANSESOPHAGEAL ECHOCARDIOGRAM (TEE) (N/A) ENDOVEIN HARVEST OF GREATER SAPHENOUS VEIN (Right) APPLICATION OF CELL SAVER   Total Length of Stay:  LOS: 3 days  Events:   No events Low CT output extubated    BP 117/78    Pulse 89    Temp (!) 97 F (36.1 C)    Resp 18    Ht 5\' 5"  (1.651 m)    Wt 109.8 kg    SpO2 100%    BMI 40.28 kg/m   PAP: (22-46)/(14-29) 35/22 CVP:  [16 mmHg-23 mmHg] 17 mmHg CO:  [2.2 L/min-3.4 L/min] 3.4 L/min CI:  [1 L/min/m2-1.6 L/min/m2] 1.6 L/min/m2  Vent Mode: PSV;CPAP FiO2 (%):  [40 %-50 %] 40 % Set Rate:  [4 bmp-12 bmp] 4 bmp Vt Set:  [450 mL] 450 mL PEEP:  [5 cmH20] 5 cmH20 Pressure Support:  [10 cmH20] 10 cmH20 Plateau Pressure:  [13 cmH20-16 cmH20] 13 cmH20   sodium chloride     [START ON 12/14/2021] sodium chloride     sodium chloride     dexmedetomidine (PRECEDEX) IV infusion Stopped (12/13/21 1602)   famotidine (PEPCID) IV Stopped (12/13/21 1401)   insulin 4.8 Units/hr (12/13/21 2000)   lactated ringers     lactated ringers     lactated ringers 20 mL/hr at 12/13/21 2000   [START ON 12/14/2021] levofloxacin (LEVAQUIN) IV     nitroGLYCERIN     phenylephrine (NEO-SYNEPHRINE) Adult infusion 10 mcg/min (12/13/21 2000)    I/O last 3 completed shifts: In: 5097.9 [P.O.:836; I.V.:2629.9; Blood:415; IV Piggyback:1217.1] Out: 4944 [Urine:3665; Blood:700; Chest Tube:170]   CBC Latest Ref Rng & Units 12/13/2021 12/13/2021 12/13/2021  WBC 4.0 - 10.5 K/uL - - 10.5  Hemoglobin 12.0 - 15.0 g/dL 11.6(L) 11.9(L) 11.7(L)  Hematocrit 36.0 - 46.0 % 34.0(L) 35.0(L) 36.2  Platelets 150 - 400 K/uL - - 162    BMP Latest Ref Rng & Units 12/13/2021 12/13/2021  12/13/2021  Glucose 70 - 99 mg/dL - 169(H) -  BUN 8 - 23 mg/dL - 11 -  Creatinine 0.44 - 1.00 mg/dL - 0.78 -  Sodium 135 - 145 mmol/L 140 136 140  Potassium 3.5 - 5.1 mmol/L 4.9 5.3(H) 5.3(H)  Chloride 98 - 111 mmol/L - 112(H) -  CO2 22 - 32 mmol/L - 21(L) -  Calcium 8.9 - 10.3 mg/dL - 7.6(L) -    ABG    Component Value Date/Time   PHART 7.300 (L) 12/13/2021 1958   PCO2ART 43.7 12/13/2021 1958   PO2ART 104 12/13/2021 1958   HCO3 21.7 12/13/2021 1958   TCO2 23 12/13/2021 1958   ACIDBASEDEF 5.0 (H) 12/13/2021 1958   O2SAT 98.0 12/13/2021 1958       Melodie Bouillon, MD 12/13/2021 9:27 PM

## 2021-12-13 NOTE — Anesthesia Procedure Notes (Signed)
Procedure Name: Intubation Date/Time: 12/13/2021 7:50 AM Performed by: Lorie Phenix, CRNA Pre-anesthesia Checklist: Patient identified, Emergency Drugs available, Suction available and Patient being monitored Patient Re-evaluated:Patient Re-evaluated prior to induction Oxygen Delivery Method: Circle system utilized Preoxygenation: Pre-oxygenation with 100% oxygen Induction Type: IV induction Ventilation: Mask ventilation without difficulty Laryngoscope Size: Mac and 3 Grade View: Grade II Tube type: Oral Tube size: 8.0 mm Number of attempts: 1 Airway Equipment and Method: Stylet Placement Confirmation: ETT inserted through vocal cords under direct vision, positive ETCO2 and breath sounds checked- equal and bilateral Secured at: 22 cm Tube secured with: Tape Dental Injury: Teeth and Oropharynx as per pre-operative assessment

## 2021-12-13 NOTE — Transfer of Care (Signed)
Immediate Anesthesia Transfer of Care Note  Patient: Katherine Pratt  Procedure(s) Performed: CORONARY ARTERY BYPASS GRAFTING (CABG) X  4  , ON PUMP, USING LEFT INTERNAL MAMMARY ARTERY AND RIGHT ENDOSCOPIC GREATER SAPHENOUS VEIN CONDUITS (Chest) TRANSESOPHAGEAL ECHOCARDIOGRAM (TEE) ENDOVEIN HARVEST OF GREATER SAPHENOUS VEIN (Right) APPLICATION OF CELL SAVER  Patient Location: SICU  Anesthesia Type:General  Level of Consciousness: Patient remains intubated per anesthesia plan  Airway & Oxygen Therapy: Patient remains intubated per anesthesia plan and Patient placed on Ventilator (see vital sign flow sheet for setting)  Post-op Assessment: Report given to RN and Post -op Vital signs reviewed and stable  Post vital signs: Reviewed and stable  Last Vitals:  Vitals Value Taken Time  BP 97/69 12/13/21 1316  Temp 36.3 C 12/13/21 1328  Pulse 80 12/13/21 1328  Resp 14 12/13/21 1328  SpO2 100 % 12/13/21 1328  Vitals shown include unvalidated device data.  Last Pain:  Vitals:   12/13/21 0459  TempSrc: Oral  PainSc:       Patients Stated Pain Goal: 0 (61/44/31 5400)  Complications: No notable events documented.

## 2021-12-13 NOTE — Hospital Course (Addendum)
History of Present Illness:     This is a 71 year old female with a past medical history of obesity, hyperlipidemia, hypertension, diabetes mellitus, right breast cancer (lumpectomy and radiation) who presented initially to [facility] with complaints of chest pain. She reported intermittent chest pain ("in my heart, not my chest") that was stabbing like and fairly severe on and off throughout the night 12/08/2021.  Per medical record, she sat on the side of the bed and rocked. Her left arm and shoulder hurt and she was sweating a lot. She denied shortness of breath, nausea, vomiting. She took a Pepcid and was able to get some sleep. On 12/09/2021, she took the trash out and had sharp chest pain (not as bad as the night before). She felt weak and thought her legs were going to give out. Of note, she had COVID booster on Thursday 12/29. She called a friend to take her to the [facility] ED. She ruled in for a NSTEMI. She was put on a Heparin drip. She was transferred to [facility] for further evaluation and treatment. She underwent a cardiac catheterization on 01/04. Results showed LVEF 50-55% and severe multivessel CAD (please see specific results below). At the time of my exam, she is eating lunch. She denies chest pain or shortness of breath.  Following full review of the patient's studies and evaluation of the patient's status Dr. Cyndia Bent recommended proceeding with CABG.  Hospital course:  The patient was felt to be medically stable to proceed and on 12/13/2021 patient was taken the operating room and underwent coronary artery bypass grafting x4.  Patient tolerated procedure well was taken to the surgical intensive care unit in stable condition. She was extubated the evening of surgery. Swan and a line were removed on post op day one. She remained in sinus rhythm and on no drips. Chest tubes and wires were removed on post op day two.  She was transferred to [facility] for continued post  surgical rehabilitation.  She has continued to make very steady progress.  She has maintained normal sinus rhythm.  She has had some hypertension and Norvasc has been resumed at home dose when she is also on lisinopril.  Oxygen has been weaned and she maintains good saturations on room air.  Incisions are healing well without evidence of infection.  She is tolerating diet.  Blood sugars are under adequate control and she will be on home medication regimen at time of discharge with the addition of Jardiance.  Incisions are all noted to be healing well without evidence of infection.  Activity level continues to improve over time as does strength and endurance with ambulation.  She does have some shortness of breath that she attributes to facemask when she walks.  This is improving with time.  Overall, at the time of discharge the patient is felt to be quite stable.

## 2021-12-14 ENCOUNTER — Inpatient Hospital Stay (HOSPITAL_COMMUNITY): Payer: Medicare HMO

## 2021-12-14 DIAGNOSIS — I214 Non-ST elevation (NSTEMI) myocardial infarction: Secondary | ICD-10-CM | POA: Diagnosis not present

## 2021-12-14 LAB — CBC
HCT: 31.7 % — ABNORMAL LOW (ref 36.0–46.0)
HCT: 31.6 % — ABNORMAL LOW (ref 36.0–46.0)
Hemoglobin: 10.9 g/dL — ABNORMAL LOW (ref 12.0–15.0)
Hemoglobin: 10.6 g/dL — ABNORMAL LOW (ref 12.0–15.0)
MCH: 32.6 pg (ref 26.0–34.0)
MCH: 32.1 pg (ref 26.0–34.0)
MCHC: 34.4 g/dL (ref 30.0–36.0)
MCHC: 33.5 g/dL (ref 30.0–36.0)
MCV: 94.9 fL (ref 80.0–100.0)
MCV: 95.8 fL (ref 80.0–100.0)
Platelets: 122 10*3/uL — ABNORMAL LOW (ref 150–400)
Platelets: 136 10*3/uL — ABNORMAL LOW (ref 150–400)
RBC: 3.34 MIL/uL — ABNORMAL LOW (ref 3.87–5.11)
RBC: 3.3 MIL/uL — ABNORMAL LOW (ref 3.87–5.11)
RDW: 12.5 % (ref 11.5–15.5)
RDW: 12.7 % (ref 11.5–15.5)
WBC: 8.3 10*3/uL (ref 4.0–10.5)
WBC: 9.8 10*3/uL (ref 4.0–10.5)
nRBC: 0 % (ref 0.0–0.2)
nRBC: 0 % (ref 0.0–0.2)

## 2021-12-14 LAB — GLUCOSE, CAPILLARY
Glucose-Capillary: 118 mg/dL — ABNORMAL HIGH (ref 70–99)
Glucose-Capillary: 125 mg/dL — ABNORMAL HIGH (ref 70–99)
Glucose-Capillary: 128 mg/dL — ABNORMAL HIGH (ref 70–99)
Glucose-Capillary: 128 mg/dL — ABNORMAL HIGH (ref 70–99)
Glucose-Capillary: 133 mg/dL — ABNORMAL HIGH (ref 70–99)
Glucose-Capillary: 136 mg/dL — ABNORMAL HIGH (ref 70–99)
Glucose-Capillary: 141 mg/dL — ABNORMAL HIGH (ref 70–99)
Glucose-Capillary: 143 mg/dL — ABNORMAL HIGH (ref 70–99)
Glucose-Capillary: 145 mg/dL — ABNORMAL HIGH (ref 70–99)
Glucose-Capillary: 146 mg/dL — ABNORMAL HIGH (ref 70–99)
Glucose-Capillary: 152 mg/dL — ABNORMAL HIGH (ref 70–99)
Glucose-Capillary: 175 mg/dL — ABNORMAL HIGH (ref 70–99)

## 2021-12-14 LAB — MAGNESIUM
Magnesium: 3 mg/dL — ABNORMAL HIGH (ref 1.7–2.4)
Magnesium: 2.9 mg/dL — ABNORMAL HIGH (ref 1.7–2.4)

## 2021-12-14 LAB — BASIC METABOLIC PANEL
Anion gap: 3 — ABNORMAL LOW (ref 5–15)
Anion gap: 6 (ref 5–15)
BUN: 9 mg/dL (ref 8–23)
BUN: 14 mg/dL (ref 8–23)
CO2: 22 mmol/L (ref 22–32)
CO2: 22 mmol/L (ref 22–32)
Calcium: 8.1 mg/dL — ABNORMAL LOW (ref 8.9–10.3)
Calcium: 8.3 mg/dL — ABNORMAL LOW (ref 8.9–10.3)
Chloride: 110 mmol/L (ref 98–111)
Chloride: 108 mmol/L (ref 98–111)
Creatinine, Ser: 0.67 mg/dL (ref 0.44–1.00)
Creatinine, Ser: 0.89 mg/dL (ref 0.44–1.00)
GFR, Estimated: >60 mL/min (ref >60)
GFR, Estimated: >60 mL/min (ref >60)
Glucose, Bld: 138 mg/dL — ABNORMAL HIGH (ref 70–99)
Glucose, Bld: 193 mg/dL — ABNORMAL HIGH (ref 70–99)
Potassium: 4.4 mmol/L (ref 3.5–5.1)
Potassium: 4.2 mmol/L (ref 3.5–5.1)
Sodium: 135 mmol/L (ref 135–145)
Sodium: 136 mmol/L (ref 135–145)

## 2021-12-14 MED ORDER — CHLORHEXIDINE GLUCONATE 0.12 % MT SOLN: 15.0000 mL | Freq: Two times a day (BID) | OROMUCOSAL | Status: DC

## 2021-12-14 MED ORDER — INSULIN DETEMIR 100 UNIT/ML ~~LOC~~ SOLN
10.0000 [IU] | Freq: Once | SUBCUTANEOUS | Status: AC
  Administered 2021-12-14: 10 [IU] via SUBCUTANEOUS
  Filled 2021-12-14: qty 0.1

## 2021-12-14 MED ORDER — INSULIN DETEMIR 100 UNIT/ML ~~LOC~~ SOLN
10.0000 [IU] | Freq: Every day | SUBCUTANEOUS | Status: DC
  Administered 2021-12-15 – 2021-12-20 (×6): 10 [IU] via SUBCUTANEOUS
  Filled 2021-12-14 (×6): qty 0.1

## 2021-12-14 MED ORDER — ORAL CARE MOUTH RINSE: 15.0000 mL | Freq: Two times a day (BID) | OROMUCOSAL | Status: DC

## 2021-12-14 MED ORDER — ALBUMIN HUMAN 5 % IV SOLN
250.0000 mL | Freq: Once | INTRAVENOUS | Status: AC
  Administered 2021-12-14: 12.5 g via INTRAVENOUS
  Filled 2021-12-14: qty 250

## 2021-12-14 MED ORDER — INSULIN ASPART 100 UNIT/ML IJ SOLN
0.0000 [IU] | INTRAMUSCULAR | Status: DC
  Administered 2021-12-14: 2 [IU] via SUBCUTANEOUS
  Administered 2021-12-14: 4 [IU] via SUBCUTANEOUS
  Administered 2021-12-15 (×2): 2 [IU] via SUBCUTANEOUS
  Administered 2021-12-15: 4 [IU] via SUBCUTANEOUS
  Administered 2021-12-15 – 2021-12-16 (×4): 2 [IU] via SUBCUTANEOUS

## 2021-12-14 NOTE — Progress Notes (Signed)
Progress Note  Patient Name: Katherine Pratt Date of Encounter: 12/14/2021  [facility] Cardiologist: None   Subjective   Continues to improve.  Postop.  Inpatient Medications    Scheduled Meds:  acetaminophen  1,000 mg Oral Q6H   Or   acetaminophen (TYLENOL) oral liquid 160 mg/5 mL  1,000 mg Per Tube Q6H   aspirin EC  325 mg Oral Daily   Or   aspirin  324 mg Per Tube Daily   atorvastatin  80 mg Oral Daily   bisacodyl  10 mg Oral Daily   Or   bisacodyl  10 mg Rectal Daily   chlorhexidine gluconate (MEDLINE KIT)  15 mL Mouth Rinse BID   Chlorhexidine Gluconate Cloth  6 each Topical Daily   docusate sodium  200 mg Oral Daily   mouth rinse  15 mL Mouth Rinse 10 times per day   metoprolol tartrate  12.5 mg Oral BID   Or   metoprolol tartrate  12.5 mg Per Tube BID   [START ON 12/15/2021] pantoprazole  40 mg Oral Daily   sodium chloride flush  3 mL Intravenous Q12H   Continuous Infusions:  sodium chloride     sodium chloride     sodium chloride     dexmedetomidine (PRECEDEX) IV infusion Stopped (12/13/21 1602)   famotidine     insulin 1.9 Units/hr (12/14/21 0800)   lactated ringers     lactated ringers     lactated ringers 20 mL/hr at 12/14/21 0800   levofloxacin (LEVAQUIN) IV     nitroGLYCERIN     phenylephrine (NEO-SYNEPHRINE) Adult infusion Stopped (12/13/21 2058)   PRN Meds: sodium chloride, dextrose, lactated ringers, metoprolol tartrate, midazolam, morphine injection, ondansetron (ZOFRAN) IV, oxyCODONE, sodium chloride flush, traMADol   Vital Signs    Vitals:   12/14/21 0630 12/14/21 0645 12/14/21 0700 12/14/21 0800  BP: 128/69  127/64 106/63  Pulse: 79 78 80 80  Resp: 17 (!) _0 Temp: 99.1 F (37.3 C) 99 F (37.2 C) 99.1 F (37.3 C) 99 F (37.2 C)  TempSrc:      SpO2: 98% 97% 97% 95%  Weight:      Height:        Intake/Output Summary (Last 24 hours) at 12/14/2021 0825 Last data filed at 12/14/2021 0800 Gross per 24 hour  Intake 4925.79 ml   Output 3905 ml  Net 1020.79 ml   Last 3 Weights 12/14/2021 12/13/2021 12/12/2021  Weight (lbs) 250 lb 6.4 oz 242 lb 1 oz 238 lb 11.2 oz  Weight (kg) 113.581 kg 109.8 kg 108.274 kg      Telemetry    AV pacing- Personally Reviewed  ECG    Sinus rhythm with first-degree AV block 340 ms- Personally Reviewed  Physical Exam   GEN: No acute distress.  Swan in place cardiac index 1.9 Neck: No JVD Cardiac: RRR, no murmurs, rubs, or gallops.  Respiratory: Clear to auscultation bilaterally. GI: Soft, nontender, non-distended  MS: No edema; No deformity. Neuro:  Nonfocal  Psych: Normal affect   Labs    High Sensitivity Troponin:   Recent Labs  Lab 12/09/21 1936 12/09/21 2148 12/10/21 0535 12/10/21 1407 12/10/21 1611  TROPONINIHS 288* 285* 226* 130* 126*     Chemistry Recent Labs  Lab 12/09/21 1936 12/11/21 0052 12/12/21 0343 12/13/21 0756 12/13/21 1209 12/13/21 1326 12/13/21 1824 12/13/21 1958 12/14/21 0428  NA 138 138 135   < > 139   < > 136  140 140 135  K 3.5 3.7 3.4*   < > 4.5   < > 5.3*   5.3* 4.9 4.4  CL 104 106 108   < > 107  --  112*  --  110  CO2 _0 --   --   --  21*  --  22  GLUCOSE 130* 169* 141*   < > 156*  --  169*  --  138*  BUN _1 < > 11  --  11  --  9  CREATININE 0.98 0.88 0.89   < > 0.60  --  0.78  --  0.67  CALCIUM 8.8* 8.3* 8.4*  --   --   --  7.6*  --  8.1*  MG  --   --   --   --   --   --  3.6*  --  3.0*  PROT 7.1 5.8*  --   --   --   --   --   --   --   ALBUMIN 3.5 2.9*  --   --   --   --   --   --   --   AST 43* 25  --   --   --   --   --   --   --   ALT 57* 39  --   --   --   --   --   --   --   ALKPHOS 114 106  --   --   --   --   --   --   --   BILITOT 0.4 0.6  --   --   --   --   --   --   --   GFRNONAA >60 >60 >60  --   --   --  >60  --  >60  ANIONGAP _2 --   --   --  3*  --  3*   < > = values in this interval not displayed.    Lipids  Recent Labs  Lab 12/10/21 1407  CHOL 154  TRIG 96  HDL 32*  LDLCALC 103*   CHOLHDL 4.8    Hematology Recent Labs  Lab 12/13/21 1323 12/13/21 1326 12/13/21 1824 12/13/21 1958 12/14/21 0428  WBC 9.4  --  10.5  --  8.3  RBC 3.41*  --  3.75*  --  3.34*  HGB 10.6*   < > 11.9*   11.7* 11.6* 10.9*  HCT 32.6*   < > 35.0*   36.2 34.0* 31.7*  MCV 95.6  --  96.5  --  94.9  MCH 31.1  --  31.2  --  32.6  MCHC 32.5  --  32.3  --  34.4  RDW 12.4  --  12.6  --  12.5  PLT 143*  --  162  --  122*   < > = values in this interval not displayed.   Thyroid  Recent Labs  Lab 12/10/21 1407  TSH 1.489    BNP Recent Labs  Lab 12/09/21 1936  BNP 123.5*    DDimer  Recent Labs  Lab 12/09/21 1936  DDIMER 0.63*     Radiology    DG Chest Port 1 View  Result Date: 12/13/2021 CLINICAL DATA:  Status post CABG EXAM: PORTABLE CHEST 1 VIEW COMPARISON:  Chest two views 12/09/2021 FINDINGS: Interval median sternotomy. Right internal jugular approach Swan-Ganz catheter  with tip overlying the pulmonary artery outflow tract, new from prior. New endotracheal tube with tip terminating approximately 2.9 cm above the carina. Enteric tube descends below the diaphragm with the tip excluded by collimation. Left sided chest tube with tip overlying the superolateral aspect of the left hemithorax. Mildly decreased lung volumes. No large pleural effusion is seen. No pneumothorax. IMPRESSION:: IMPRESSION: 1. Interval median sternotomy for CABG. 2. Support apparatus as above, in appropriate position. 3. No acute lung process. Electronically Signed   By: Yvonne Kendall   On: 12/13/2021 14:06   VAS US DOPPLER PRE CABG  Result Date: 12/12/2021 PREOPERATIVE VASCULAR EVALUATION Patient Name:  Katherine Pratt  Date of Exam:   12/12/2021 Medical Rec #: [id number]     Accession #:    [id number] Date of Birth: 1951-09-27    Patient Gender: F Patient Age:   36 years Exam Location:  [facility] Procedure:      VAS US DOPPLER PRE CABG Referring Phys: Gilford Raid  --------------------------------------------------------------------------------  Indications:      Pre-CABG. Risk Factors:     Hypertension, hyperlipidemia, Diabetes. Comparison Study: no prior Performing Technologist: Archie Patten RVS  Examination Guidelines: A complete evaluation includes B-mode imaging, spectral Doppler, color Doppler, and power Doppler as needed of all accessible portions of each vessel. Bilateral testing is considered an integral part of a complete examination. Limited examinations for reoccurring indications may be performed as noted.  Right Carotid Findings: +----------+--------+--------+--------+------------+--------+             PSV cm/s EDV cm/s Stenosis Describe     Comments  +----------+--------+--------+--------+------------+--------+  CCA Prox   72       14                heterogenous           +----------+--------+--------+--------+------------+--------+  CCA Distal 60       14                heterogenous           +----------+--------+--------+--------+------------+--------+  ICA Prox   84       27       1-39%    heterogenous           +----------+--------+--------+--------+------------+--------+  ICA Distal 80       17                                       +----------+--------+--------+--------+------------+--------+  ECA        71       7                                        +----------+--------+--------+--------+------------+--------+ +----------+--------+-------+--------+------------+             PSV cm/s EDV cms Describe Arm Pressure  +----------+--------+-------+--------+------------+  Subclavian 109                                     +----------+--------+-------+--------+------------+ +---------+--------+--+--------+--+---------+  Vertebral PSV cm/s 94 EDV cm/s 24 Antegrade  +---------+--------+--+--------+--+---------+ Left Carotid Findings: +----------+--------+--------+--------+------------+--------+             PSV cm/s EDV cm/s Stenosis Describe     Comments   +----------+--------+--------+--------+------------+--------+  CCA Prox   68       12                heterogenous           +----------+--------+--------+--------+------------+--------+  CCA Distal 70       16                heterogenous           +----------+--------+--------+--------+------------+--------+  ICA Prox   56       20       1-39%    heterogenous           +----------+--------+--------+--------+------------+--------+  ICA Distal 52       16                                       +----------+--------+--------+--------+------------+--------+  ECA        61                                                +----------+--------+--------+--------+------------+--------+ +----------+--------+--------+--------+------------+  Subclavian PSV cm/s EDV cm/s Describe Arm Pressure  +----------+--------+--------+--------+------------+             127                                      +----------+--------+--------+--------+------------+ +---------+--------+--+--------+--+---------+  Vertebral PSV cm/s 52 EDV cm/s 17 Antegrade  +---------+--------+--+--------+--+---------+  ABI Findings: +--------+------------------+-----+---------+--------------------+  Right    Rt Pressure (mmHg) Index Waveform  Comment               +--------+------------------+-----+---------+--------------------+  Brachial                          triphasic restricted extremity  +--------+------------------+-----+---------+--------------------+  PTA      174                1.18  triphasic                       +--------+------------------+-----+---------+--------------------+  DP       172                1.17  triphasic                       +--------+------------------+-----+---------+--------------------+ +--------+------------------+-----+---------+-------+  Left     Lt Pressure (mmHg) Index Waveform  Comment  +--------+------------------+-----+---------+-------+  Brachial 147                      triphasic           +--------+------------------+-----+---------+-------+  PTA      144                0.98  triphasic          +--------+------------------+-----+---------+-------+  DP       166                1.13  triphasic          +--------+------------------+-----+---------+-------+ +-------+---------------+----------------+  ABI/TBI Today's ABI/TBI Previous ABI/TBI  +-------+---------------+----------------+  Right   1.18                              +-------+---------------+----------------+  Left    1.13                              +-------+---------------+----------------+  Right Doppler Findings: +--------+--------+-----+---------+--------------------+  Site     Pressure Index Doppler   Comments              +--------+--------+-----+---------+--------------------+  Brachial                triphasic restricted extremity  +--------+--------+-----+---------+--------------------+  Radial                  triphasic                       +--------+--------+-----+---------+--------------------+  Ulnar                   triphasic                       +--------+--------+-----+---------+--------------------+  Left Doppler Findings: +--------+--------+-----+---------+--------+  Site     Pressure Index Doppler   Comments  +--------+--------+-----+---------+--------+  Brachial 147            triphasic           +--------+--------+-----+---------+--------+  Radial                  triphasic           +--------+--------+-----+---------+--------+  Ulnar                   triphasic           +--------+--------+-----+---------+--------+  Summary: Right Carotid: Velocities in the right ICA are consistent with a 1-39% stenosis. Left Carotid: Velocities in the left ICA are consistent with a 1-39% stenosis. Vertebrals: Bilateral vertebral arteries demonstrate antegrade flow. Right ABI: Resting right ankle-brachial index is within normal range. No evidence of significant right lower extremity arterial disease. Left ABI: Resting left  ankle-brachial index is within normal range. No evidence of significant left lower extremity arterial disease. Right Upper Extremity: Doppler waveforms remain within normal limits with right radial compression. Doppler waveforms remain within normal limits with right ulnar compression. Left Upper Extremity: Doppler waveforms remain within normal limits with left radial compression. Doppler waveforms remain within normal limits with left ulnar compression.  Electronically signed by Jamelle Haring on 12/12/2021 at 4:59:41 PM.    Final      Patient Profile     71 y.o. female post CABG  Assessment & Plan    CAD post CABG -Surgical note reviewed.  Continue with current supportive care. Left internal mammary artery graft to the LAD SVG to OM1 SVG to OM2 SVG to PDA   Currently on aspirin, atorvastatin 80 mg, metoprolol 12.5 mg on order set.-Be careful with metoprolol given first-degree AV block No arrhythmias currently. Currently AV paced.  First-degree AV block - Quite long 340 ms.  Currently AV pacing.  EKG this morning demonstrated this.  Continue to monitor.  Should note some improvement with recovery, preop EKG however demonstrated first-degree AV block of 270 ms. - Be careful with metoprolol, currently low-dose however has not been administered, given first-degree AV block  Hyperlipidemia - LDL goal less than 55.  On high intensity statin 80 mg.    For questions or updates, please contact [place] Please consult [email or web address] for contact info under        Signed, Candee Furbish, MD  12/14/2021, 8:25  AM

## 2021-12-14 NOTE — Progress Notes (Signed)
[address]       [place] [address]             [phone]                 1 Day Post-Op Procedure(s) (LRB): CORONARY ARTERY BYPASS GRAFTING (CABG) X  4  , ON PUMP, USING LEFT INTERNAL MAMMARY ARTERY AND RIGHT ENDOSCOPIC GREATER SAPHENOUS VEIN CONDUITS (N/A) TRANSESOPHAGEAL ECHOCARDIOGRAM (TEE) (N/A) ENDOVEIN HARVEST OF GREATER SAPHENOUS VEIN (Right) APPLICATION OF CELL SAVER   Events: No events Extubated overnight _______________________________________________________________ Vitals: BP 121/63    Pulse 80    Temp 98.8 F (37.1 C)    Resp (!) 22    Ht 5\' 5"  (1.651 m)    Wt 113.6 kg    SpO2 96%    BMI 41.67 kg/m  Filed Weights   12/12/21 0305 12/13/21 0459 12/14/21 0500  Weight: 108.3 kg 109.8 kg 113.6 kg     - Neuro: alert NAD  - Cardiovascular: sinus  Drips: none.   PAP: (22-46)/(9-26) 32/18 CVP:  [10 mmHg-23 mmHg] 13 mmHg CO:  [2.2 L/min-4.5 L/min] 4.4 L/min CI:  [1 L/min/m2-2.1 L/min/m2] 2.1 L/min/m2  - Pulm: EWOB   ABG    Component Value Date/Time   PHART 7.300 (L) 12/13/2021 1958   PCO2ART 43.7 12/13/2021 1958   PO2ART 104 12/13/2021 1958   HCO3 21.7 12/13/2021 1958   TCO2 23 12/13/2021 1958   ACIDBASEDEF 5.0 (H) 12/13/2021 1958   O2SAT 98.0 12/13/2021 1958    - Abd: ND - Extremity: warm  .Intake/Output      01/06 0701 01/07 0700 01/07 0701 01/08 0700   P.O.     I.V. (mL/kg) 2788 (24.5) 43.2 (0.4)   Blood 415    IV Piggyback 2100.8    Total Intake(mL/kg) 5303.8 (46.7) 43.2 (0.4)   Urine (mL/kg/hr) 2795 (1)    Emesis/NG output 0    Stool     Blood 700    Chest Tube 410    Total Output 3905    Net +1398.8 +43.2           _______________________________________________________________ Labs: CBC Latest Ref Rng & Units 12/14/2021 12/13/2021 12/13/2021  WBC 4.0 - 10.5 K/uL 8.3 - -  Hemoglobin 12.0 - 15.0 g/dL 10.9(L) 11.6(L) 11.9(L)  Hematocrit 36.0 - 46.0 % 31.7(L) 34.0(L) 35.0(L)  Platelets 150 - 400 K/uL 122(L) -  -   CMP Latest Ref Rng & Units 12/14/2021 12/13/2021 12/13/2021  Glucose 70 - 99 mg/dL 138(H) - 169(H)  BUN 8 - 23 mg/dL 9 - 11  Creatinine 0.44 - 1.00 mg/dL 0.67 - 0.78  Sodium 135 - 145 mmol/L 135 140 136  Potassium 3.5 - 5.1 mmol/L 4.4 4.9 5.3(H)  Chloride 98 - 111 mmol/L 110 - 112(H)  CO2 22 - 32 mmol/L 22 - 21(L)  Calcium 8.9 - 10.3 mg/dL 8.1(L) - 7.6(L)  Total Protein 6.5 - 8.1 g/dL - - -  Total Bilirubin 0.3 - 1.2 mg/dL - - -  Alkaline Phos 38 - 126 U/L - - -  AST 15 - 41 U/L - - -  ALT 0 - 44 U/L - - -    CXR: Clear, PV congestion  _______________________________________________________________  Assessment and Plan: POD 1 s/p CABG  Neuro: pain controlled CV: pacer set to back up.  Will remove swan and A-line.  On A/S/BB. Pulm: pulm hygiene. Renal: creat stable GI: on diet Heme: satble ID: [email or web address] Endo: SSI Dispo:  continue ICU care   Lajuana Matte 12/14/2021 9:48 AM

## 2021-12-14 NOTE — Plan of Care (Signed)

## 2021-12-14 NOTE — Progress Notes (Signed)
Patient UOP 150 for shift, paged Dr. Kipp Brood. 250 ml of 5% albumin ordered

## 2021-12-15 ENCOUNTER — Inpatient Hospital Stay (HOSPITAL_COMMUNITY): Payer: Medicare HMO

## 2021-12-15 DIAGNOSIS — I214 Non-ST elevation (NSTEMI) myocardial infarction: Secondary | ICD-10-CM | POA: Diagnosis not present

## 2021-12-15 LAB — CBC
HCT: 30.9 % — ABNORMAL LOW (ref 36.0–46.0)
Hemoglobin: 10.2 g/dL — ABNORMAL LOW (ref 12.0–15.0)
MCH: 32.5 pg (ref 26.0–34.0)
MCHC: 33 g/dL (ref 30.0–36.0)
MCV: 98.4 fL (ref 80.0–100.0)
Platelets: 150 10*3/uL (ref 150–400)
RBC: 3.14 MIL/uL — ABNORMAL LOW (ref 3.87–5.11)
RDW: 13.2 % (ref 11.5–15.5)
WBC: 10.1 10*3/uL (ref 4.0–10.5)
nRBC: 0 % (ref 0.0–0.2)

## 2021-12-15 LAB — BASIC METABOLIC PANEL
Anion gap: 6 (ref 5–15)
BUN: 19 mg/dL (ref 8–23)
CO2: 23 mmol/L (ref 22–32)
Calcium: 8.4 mg/dL — ABNORMAL LOW (ref 8.9–10.3)
Chloride: 105 mmol/L (ref 98–111)
Creatinine, Ser: 1.2 mg/dL — ABNORMAL HIGH (ref 0.44–1.00)
GFR, Estimated: 48 mL/min — ABNORMAL LOW (ref >60)
Glucose, Bld: 146 mg/dL — ABNORMAL HIGH (ref 70–99)
Potassium: 4.5 mmol/L (ref 3.5–5.1)
Sodium: 134 mmol/L — ABNORMAL LOW (ref 135–145)

## 2021-12-15 LAB — GLUCOSE, CAPILLARY
Glucose-Capillary: 132 mg/dL — ABNORMAL HIGH (ref 70–99)
Glucose-Capillary: 159 mg/dL — ABNORMAL HIGH (ref 70–99)
Glucose-Capillary: 171 mg/dL — ABNORMAL HIGH (ref 70–99)
Glucose-Capillary: 128 mg/dL — ABNORMAL HIGH (ref 70–99)
Glucose-Capillary: 142 mg/dL — ABNORMAL HIGH (ref 70–99)

## 2021-12-15 MED ORDER — SODIUM CHLORIDE 0.9% FLUSH: 3.0000 mL | INTRAVENOUS | Status: DC | PRN

## 2021-12-15 MED ORDER — ~~LOC~~ CARDIAC SURGERY, PATIENT & FAMILY EDUCATION: Freq: Once | Status: AC

## 2021-12-15 MED ORDER — SODIUM CHLORIDE 0.9% FLUSH
3.0000 mL | Freq: Two times a day (BID) | INTRAVENOUS | Status: DC
  Administered 2021-12-15 – 2021-12-19 (×9): 3 mL via INTRAVENOUS

## 2021-12-15 MED ORDER — SODIUM CHLORIDE 0.9 % IV SOLN: 250.0000 mL | INTRAVENOUS | Status: DC | PRN

## 2021-12-15 NOTE — Progress Notes (Signed)
Progress Note  Patient Name: Katherine Pratt Date of Encounter: 12/15/2021  [facility] Cardiologist: None   Subjective   Sitting up in chair.  Feeling better.  No chest pain or shortness of breath.  Inpatient Medications    Scheduled Meds:  acetaminophen  1,000 mg Oral Q6H   Or   acetaminophen (TYLENOL) oral liquid 160 mg/5 mL  1,000 mg Per Tube Q6H   aspirin EC  325 mg Oral Daily   Or   aspirin  324 mg Per Tube Daily   atorvastatin  80 mg Oral Daily   bisacodyl  10 mg Oral Daily   Or   bisacodyl  10 mg Rectal Daily   Chlorhexidine Gluconate Cloth  6 each Topical Daily   [facility], Patient & Family Education   Does not apply Once   docusate sodium  200 mg Oral Daily   insulin aspart  0-24 Units Subcutaneous Q4H   insulin detemir  10 Units Subcutaneous Daily   metoprolol tartrate  12.5 mg Oral BID   Or   metoprolol tartrate  12.5 mg Per Tube BID   pantoprazole  40 mg Oral Daily   sodium chloride flush  3 mL Intravenous Q12H   sodium chloride flush  3 mL Intravenous Q12H   Continuous Infusions:  sodium chloride     sodium chloride     sodium chloride Stopped (12/14/21 1119)   sodium chloride     dexmedetomidine (PRECEDEX) IV infusion Stopped (12/13/21 1602)   insulin Stopped (12/14/21 1341)   lactated ringers     lactated ringers     lactated ringers 20 mL/hr at 12/15/21 0700   nitroGLYCERIN     phenylephrine (NEO-SYNEPHRINE) Adult infusion Stopped (12/13/21 2058)   PRN Meds: sodium chloride, sodium chloride, dextrose, lactated ringers, metoprolol tartrate, midazolam, morphine injection, ondansetron (ZOFRAN) IV, oxyCODONE, sodium chloride flush, sodium chloride flush, traMADol   Vital Signs    Vitals:   12/15/21 0500 12/15/21 0600 12/15/21 0700 12/15/21 0739  BP: 113/63 106/80 98/77   Pulse: 78 90 79 77  Resp: 18 (!) 21 12 15   Temp:    98.1 F (36.7 C)  TempSrc:    Oral  SpO2: 94% 96% 95% 92%  Weight: 113.6 kg     Height:         Intake/Output Summary (Last 24 hours) at 12/15/2021 0839 Last data filed at 12/15/2021 0700 Gross per 24 hour  Intake 753.56 ml  Output 655 ml  Net 98.56 ml   Last 3 Weights 12/15/2021 12/14/2021 12/13/2021  Weight (lbs) 250 lb 7.1 oz 250 lb 6.4 oz 242 lb 1 oz  Weight (kg) 113.6 kg 113.581 kg 109.8 kg      Telemetry    Sinus rhythm first-degree AV block- Personally Reviewed  ECG    12/14/2021-sinus rhythm with first-degree AV block 340 ms- Personally Reviewed  Physical Exam   GEN: No acute distress.  Swan-Ganz catheter removed Neck: No JVD Cardiac: Regular rhythm, no murmurs, rubs, or gallops.  Respiratory: Clear to auscultation bilaterally. GI: Soft, nontender, non-distended  MS: No edema; No deformity. Neuro:  Nonfocal  Psych: Normal affect   Labs    High Sensitivity Troponin:   Recent Labs  Lab 12/09/21 1936 12/09/21 2148 12/10/21 0535 12/10/21 1407 12/10/21 1611  TROPONINIHS 288* 285* 226* 130* 126*     Chemistry Recent Labs  Lab 12/09/21 1936 12/11/21 0052 12/12/21 0343 12/13/21 1824 12/13/21 1958 12/14/21 0428 12/14/21 1602 12/15/21 0422  NA  138 138   < > 136   140   < > 135 136 134*  K 3.5 3.7   < > 5.3*   5.3*   < > 4.4 4.2 4.5  CL 104 106   < > 112*  --  110 108 105  CO2 24 22   < > 21*  --  22 22 23   GLUCOSE 130* 169*   < > 169*  --  138* 193* 146*  BUN 13 11   < > 11  --  9 14 19   CREATININE 0.98 0.88   < > 0.78  --  0.67 0.89 1.20*  CALCIUM 8.8* 8.3*   < > 7.6*  --  8.1* 8.3* 8.4*  MG  --   --   --  3.6*  --  3.0* 2.9*  --   PROT 7.1 5.8*  --   --   --   --   --   --   ALBUMIN 3.5 2.9*  --   --   --   --   --   --   AST 43* 25  --   --   --   --   --   --   ALT 57* 39  --   --   --   --   --   --   ALKPHOS 114 106  --   --   --   --   --   --   BILITOT 0.4 0.6  --   --   --   --   --   --   GFRNONAA >60 >60   < > >60  --  >60 >60 48*  ANIONGAP 10 10   < > 3*  --  3* 6 6   < > = values in this interval not displayed.    Lipids  Recent  Labs  Lab 12/10/21 1407  CHOL 154  TRIG 96  HDL 32*  LDLCALC 103*  CHOLHDL 4.8    Hematology Recent Labs  Lab 12/14/21 0428 12/14/21 1602 12/15/21 0422  WBC 8.3 9.8 10.1  RBC 3.34* 3.30* 3.14*  HGB 10.9* 10.6* 10.2*  HCT 31.7* 31.6* 30.9*  MCV 94.9 95.8 98.4  MCH 32.6 32.1 32.5  MCHC 34.4 33.5 33.0  RDW 12.5 12.7 13.2  PLT 122* 136* 150   Thyroid  Recent Labs  Lab 12/10/21 1407  TSH 1.489    BNP Recent Labs  Lab 12/09/21 1936  BNP 123.5*    DDimer  Recent Labs  Lab 12/09/21 1936  DDIMER 0.63*     Radiology    DG Chest Port 1 View  Result Date: 12/15/2021 CLINICAL DATA:  71 year old female status post CABG postoperative day 2. EXAM: PORTABLE CHEST 1 VIEW COMPARISON:  Portable chest 12/14/2021 and earlier. FINDINGS: Portable AP semi upright view at 0526 hours. Swan-Ganz catheter removed. Right IJ introducer sheath remains. Mediastinal and left chest tube remain. Continued low lung volumes. Stable cardiac size and mediastinal contours. Sequelae of CABG. No pneumothorax. No pulmonary edema, pulmonary vascularity appears stable to mildly improved. No definite pleural effusion. Perihilar and lung base atelectasis greater on the left. Relatively normal appearing gas in the stomach. Stable visualized osseous structures. IMPRESSION: 1. Swan-Ganz catheter removed. Otherwise stable lines and tubes. 2. Atelectasis.  No pneumothorax or pulmonary edema. Electronically Signed   By: Genevie Ann M.D.   On: 12/15/2021 07:54   DG Chest Port 1 View  Result Date: 12/14/2021  CLINICAL DATA:  In follow-up CABG surgery. EXAM: PORTABLE CHEST 1 VIEW COMPARISON:  12/13/2021 and older studies. FINDINGS: Cardiac silhouette is stable.  No mediastinal widening. Left greater than right lung base opacities are noted consistent with atelectasis, unchanged from the previous day's study. Remainder of the lungs is clear. No pneumothorax. Endotracheal tube and nasal/orogastric tube have been removed. Right  internal jugular Swan-Ganz catheter, mediastinal tube and left chest tube are stable. IMPRESSION: 1. No acute findings or evidence of an operative complication. 2. Status post extubation. 3. Left greater than right lung base opacities are stable consistent with atelectasis. No evidence of pulmonary edema. No pneumothorax or mediastinal widening. Electronically Signed   By: Lajean Manes M.D.   On: 12/14/2021 08:28   DG Chest Port 1 View  Result Date: 12/13/2021 CLINICAL DATA:  Status post CABG EXAM: PORTABLE CHEST 1 VIEW COMPARISON:  Chest two views 12/09/2021 FINDINGS: Interval median sternotomy. Right internal jugular approach Swan-Ganz catheter with tip overlying the pulmonary artery outflow tract, new from prior. New endotracheal tube with tip terminating approximately 2.9 cm above the carina. Enteric tube descends below the diaphragm with the tip excluded by collimation. Left sided chest tube with tip overlying the superolateral aspect of the left hemithorax. Mildly decreased lung volumes. No large pleural effusion is seen. No pneumothorax. IMPRESSION:: IMPRESSION: 1. Interval median sternotomy for CABG. 2. Support apparatus as above, in appropriate position. 3. No acute lung process. Electronically Signed   By: Yvonne Kendall   On: 12/13/2021 14:06     Patient Profile     71 y.o. female post CABG, first-degree AV block, hyperlipidemia  Assessment & Plan    CAD post CABG -Surgical note reviewed.  Continue with current supportive care. Left internal mammary artery graft to the LAD SVG to OM1 SVG to OM2 SVG to PDA   Currently on aspirin, atorvastatin 80 mg, metoprolol 12.5 mg on order set.-Be careful with metoprolol given first-degree AV block No arrhythmias currently. Pacer wires have been removed  First-degree AV block - Quite long 340 ms yesterday on ECG.  Pacer wires have been removed.   Continue to monitor.  Should note some improvement with recovery, preop EKG however demonstrated  first-degree AV block of 270 ms. - Be careful with metoprolol given first-degree AV block  Hyperlipidemia - LDL goal less than 55.  On high intensity statin 80 mg.  Continue to progress.   For questions or updates, please contact [place] Please consult [email or web address] for contact info under        Signed, Candee Furbish, MD  12/15/2021, 8:39 AM

## 2021-12-15 NOTE — Progress Notes (Signed)
Patient brought to 4E from [place]. VSS. Telemetry wall monitor applied, CCMD notified. Patient states pain 0/10. CHG bath completed. Patient oriented to room and staff. Call bell in reach.  Daymon Larsen, RN

## 2021-12-15 NOTE — Progress Notes (Signed)
[address]       [place] [address]             [phone]                 2 Days Post-Op Procedure(s) (LRB): CORONARY ARTERY BYPASS GRAFTING (CABG) X  4  , ON PUMP, USING LEFT INTERNAL MAMMARY ARTERY AND RIGHT ENDOSCOPIC GREATER SAPHENOUS VEIN CONDUITS (N/A) TRANSESOPHAGEAL ECHOCARDIOGRAM (TEE) (N/A) ENDOVEIN HARVEST OF GREATER SAPHENOUS VEIN (Right) APPLICATION OF CELL SAVER   Events: No events  _______________________________________________________________ Vitals: BP 98/77    Pulse 77    Temp 98.1 F (36.7 C) (Oral)    Resp 15    Ht 5\' 5"  (1.651 m)    Wt 113.6 kg    SpO2 92%    BMI 41.68 kg/m  Filed Weights   12/13/21 0459 12/14/21 0500 12/15/21 0500  Weight: 109.8 kg 113.6 kg 113.6 kg     - Neuro: alert NAD  - Cardiovascular: sinus  Drips: none.   PAP: (28-32)/(14-18) 28/14 CVP:  [11 mmHg-13 mmHg] 13 mmHg  - Pulm: EWOB   ABG    Component Value Date/Time   PHART 7.300 (L) 12/13/2021 1958   PCO2ART 43.7 12/13/2021 1958   PO2ART 104 12/13/2021 1958   HCO3 21.7 12/13/2021 1958   TCO2 23 12/13/2021 1958   ACIDBASEDEF 5.0 (H) 12/13/2021 1958   O2SAT 98.0 12/13/2021 1958    - Abd: ND - Extremity: warm  .Intake/Output      01/07 0701 01/08 0700 01/08 0701 01/09 0700   P.O. 150    I.V. (mL/kg) 475.6 (4.2)    Blood     IV Piggyback 149.9    Total Intake(mL/kg) 775.5 (6.8)    Urine (mL/kg/hr) 395 (0.1)    Emesis/NG output     Blood     Chest Tube 260    Total Output 655    Net +120.5            _______________________________________________________________ Labs: CBC Latest Ref Rng & Units 12/15/2021 12/14/2021 12/14/2021  WBC 4.0 - 10.5 K/uL 10.1 9.8 8.3  Hemoglobin 12.0 - 15.0 g/dL 10.2(L) 10.6(L) 10.9(L)  Hematocrit 36.0 - 46.0 % 30.9(L) 31.6(L) 31.7(L)  Platelets 150 - 400 K/uL 150 136(L) 122(L)   CMP Latest Ref Rng & Units 12/15/2021 12/14/2021 12/14/2021  Glucose 70 - 99 mg/dL 146(H) 193(H) 138(H)  BUN 8 - 23 mg/dL 19 14  9   Creatinine 0.44 - 1.00 mg/dL 1.20(H) 0.89 0.67  Sodium 135 - 145 mmol/L 134(L) 136 135  Potassium 3.5 - 5.1 mmol/L 4.5 4.2 4.4  Chloride 98 - 111 mmol/L 105 108 110  CO2 22 - 32 mmol/L 23 22 22   Calcium 8.9 - 10.3 mg/dL 8.4(L) 8.3(L) 8.1(L)  Total Protein 6.5 - 8.1 g/dL - - -  Total Bilirubin 0.3 - 1.2 mg/dL - - -  Alkaline Phos 38 - 126 U/L - - -  AST 15 - 41 U/L - - -  ALT 0 - 44 U/L - - -    CXR: Clear, PV congestion  _______________________________________________________________  Assessment and Plan: POD 2 s/p CABG  Neuro: pain controlled CV: On A/S/BB.  Will remove wires. Pulm: pulm hygiene.  Will remove chest tubes Renal: creat up slightly.  Net negative only 5lbs up.  Will hold off on diuresis since not on O2 GI: on diet Heme: satble ID: [email or web address] Endo: SSI Dispo: floor   Lajuana Matte 12/15/2021 7:53  AM

## 2021-12-16 ENCOUNTER — Encounter (HOSPITAL_COMMUNITY): Payer: Self-pay | Admitting: Surgery

## 2021-12-16 DIAGNOSIS — I214 Non-ST elevation (NSTEMI) myocardial infarction: Secondary | ICD-10-CM | POA: Diagnosis not present

## 2021-12-16 LAB — BASIC METABOLIC PANEL
Anion gap: 7 (ref 5–15)
BUN: 20 mg/dL (ref 8–23)
CO2: 24 mmol/L (ref 22–32)
Calcium: 8.5 mg/dL — ABNORMAL LOW (ref 8.9–10.3)
Chloride: 105 mmol/L (ref 98–111)
Creatinine, Ser: 1.02 mg/dL — ABNORMAL HIGH (ref 0.44–1.00)
GFR, Estimated: 59 mL/min — ABNORMAL LOW (ref >60)
Glucose, Bld: 131 mg/dL — ABNORMAL HIGH (ref 70–99)
Potassium: 4 mmol/L (ref 3.5–5.1)
Sodium: 136 mmol/L (ref 135–145)

## 2021-12-16 LAB — CBC
HCT: 32.4 % — ABNORMAL LOW (ref 36.0–46.0)
Hemoglobin: 10.4 g/dL — ABNORMAL LOW (ref 12.0–15.0)
MCH: 31.5 pg (ref 26.0–34.0)
MCHC: 32.1 g/dL (ref 30.0–36.0)
MCV: 98.2 fL (ref 80.0–100.0)
Platelets: 185 10*3/uL (ref 150–400)
RBC: 3.3 MIL/uL — ABNORMAL LOW (ref 3.87–5.11)
RDW: 13.2 % (ref 11.5–15.5)
WBC: 9.7 10*3/uL (ref 4.0–10.5)
nRBC: 0 % (ref 0.0–0.2)

## 2021-12-16 LAB — GLUCOSE, CAPILLARY
Glucose-Capillary: 111 mg/dL — ABNORMAL HIGH (ref 70–99)
Glucose-Capillary: 128 mg/dL — ABNORMAL HIGH (ref 70–99)
Glucose-Capillary: 131 mg/dL — ABNORMAL HIGH (ref 70–99)
Glucose-Capillary: 155 mg/dL — ABNORMAL HIGH (ref 70–99)
Glucose-Capillary: 156 mg/dL — ABNORMAL HIGH (ref 70–99)
Glucose-Capillary: 221 mg/dL — ABNORMAL HIGH (ref 70–99)

## 2021-12-16 MED ORDER — POTASSIUM CHLORIDE CRYS ER 20 MEQ PO TBCR
20.0000 meq | EXTENDED_RELEASE_TABLET | Freq: Two times a day (BID) | ORAL | Status: AC
  Administered 2021-12-16 (×2): 20 meq via ORAL
  Filled 2021-12-16 (×2): qty 1

## 2021-12-16 MED ORDER — INSULIN ASPART 100 UNIT/ML IJ SOLN
0.0000 [IU] | Freq: Three times a day (TID) | INTRAMUSCULAR | Status: DC
  Administered 2021-12-16 – 2021-12-17 (×4): 2 [IU] via SUBCUTANEOUS
  Administered 2021-12-18: 4 [IU] via SUBCUTANEOUS
  Administered 2021-12-19: 2 [IU] via SUBCUTANEOUS

## 2021-12-16 MED ORDER — FUROSEMIDE 40 MG PO TABS
40.0000 mg | ORAL_TABLET | Freq: Every day | ORAL | Status: DC
  Administered 2021-12-16 – 2021-12-20 (×5): 40 mg via ORAL
  Filled 2021-12-16 (×5): qty 1

## 2021-12-16 MED ORDER — METOPROLOL TARTRATE 25 MG/10 ML ORAL SUSPENSION: 12.5000 mg | Freq: Two times a day (BID) | ORAL | Status: DC

## 2021-12-16 MED ORDER — ENOXAPARIN SODIUM 40 MG/0.4ML IJ SOSY
40.0000 mg | PREFILLED_SYRINGE | INTRAMUSCULAR | Status: DC
  Administered 2021-12-16 – 2021-12-20 (×5): 40 mg via SUBCUTANEOUS
  Filled 2021-12-16 (×5): qty 0.4

## 2021-12-16 MED ORDER — METOPROLOL TARTRATE 25 MG PO TABS
25.0000 mg | ORAL_TABLET | Freq: Two times a day (BID) | ORAL | Status: DC
  Administered 2021-12-16 (×2): 25 mg via ORAL
  Filled 2021-12-16 (×2): qty 1

## 2021-12-16 MED ORDER — CLOPIDOGREL BISULFATE 75 MG PO TABS
75.0000 mg | ORAL_TABLET | Freq: Every day | ORAL | Status: DC
  Administered 2021-12-16 – 2021-12-20 (×5): 75 mg via ORAL
  Filled 2021-12-16 (×5): qty 1

## 2021-12-16 MED ORDER — LISINOPRIL 10 MG PO TABS
10.0000 mg | ORAL_TABLET | Freq: Every day | ORAL | Status: DC
  Administered 2021-12-16 – 2021-12-19 (×4): 10 mg via ORAL
  Filled 2021-12-16 (×4): qty 1

## 2021-12-16 MED FILL — Thrombin (Recombinant) For Soln 20000 Unit: CUTANEOUS | Qty: 1 | Status: AC

## 2021-12-16 NOTE — Progress Notes (Addendum)
[address]       [place] [address]             [phone]      3 Days Post-Op Procedure(s) (LRB): CORONARY ARTERY BYPASS GRAFTING (CABG) X  4  , ON PUMP, USING LEFT INTERNAL MAMMARY ARTERY AND RIGHT ENDOSCOPIC GREATER SAPHENOUS VEIN CONDUITS (N/A) TRANSESOPHAGEAL ECHOCARDIOGRAM (TEE) (N/A) ENDOVEIN HARVEST OF GREATER SAPHENOUS VEIN (Right) APPLICATION OF CELL SAVER Subjective: Some nausea, o/w feels pretty well  Objective: Vital signs in last 24 hours: Temp:  [98.1 F (36.7 C)-98.9 F (37.2 C)] 98.9 F (37.2 C) (01/09 0500) Pulse Rate:  [73-82] 73 (01/09 0500) Cardiac Rhythm: Normal sinus rhythm (01/08 1900) Resp:  [14-24] 20 (01/09 0500) BP: (102-147)/(60-82) 125/70 (01/09 0500) SpO2:  [65 %-97 %] 95 % (01/09 0500) Weight:  [113.4 kg] 113.4 kg (01/09 0500)  Hemodynamic parameters for last 24 hours:    Intake/Output from previous day: 01/08 0701 - 01/09 0700 In: 434.9 [P.O.:360; I.V.:74.9] Out: 390 [Urine:290; Chest Tube:100] Intake/Output this shift: No intake/output data recorded.  General appearance: alert, cooperative, and no distress Heart: regular rate and rhythm Lungs: dim in bases Abdomen: soft, non tender, obese Extremities: + LE edema Wound: evh sites healing well, sternal dressing in place  Lab Results: Recent Labs    12/15/21 0422 12/16/21 0119  WBC 10.1 9.7  HGB 10.2* 10.4*  HCT 30.9* 32.4*  PLT 150 185   BMET:  Recent Labs    12/15/21 0422 12/16/21 0119  NA 134* 136  K 4.5 4.0  CL 105 105  CO2 23 24  GLUCOSE 146* 131*  BUN 19 20  CREATININE 1.20* 1.02*  CALCIUM 8.4* 8.5*    PT/INR:  Recent Labs    12/13/21 1323  LABPROT 18.5*  INR 1.5*   ABG    Component Value Date/Time   PHART 7.300 (L) 12/13/2021 1958   HCO3 21.7 12/13/2021 1958   TCO2 23 12/13/2021 1958   ACIDBASEDEF 5.0 (H) 12/13/2021 1958   O2SAT 98.0 12/13/2021 1958   CBG (last 3)  Recent Labs    12/15/21 2018 12/16/21 0015  12/16/21 0411  GLUCAP 142* 128* 111*    Meds Scheduled Meds:  acetaminophen  1,000 mg Oral Q6H   Or   acetaminophen (TYLENOL) oral liquid 160 mg/5 mL  1,000 mg Per Tube Q6H   aspirin EC  325 mg Oral Daily   Or   aspirin  324 mg Per Tube Daily   atorvastatin  80 mg Oral Daily   bisacodyl  10 mg Oral Daily   Or   bisacodyl  10 mg Rectal Daily   Chlorhexidine Gluconate Cloth  6 each Topical Daily   docusate sodium  200 mg Oral Daily   insulin aspart  0-24 Units Subcutaneous Q4H   insulin detemir  10 Units Subcutaneous Daily   metoprolol tartrate  12.5 mg Oral BID   Or   metoprolol tartrate  12.5 mg Per Tube BID   pantoprazole  40 mg Oral Daily   sodium chloride flush  3 mL Intravenous Q12H   sodium chloride flush  3 mL Intravenous Q12H   Continuous Infusions:  sodium chloride     sodium chloride     sodium chloride Stopped (12/14/21 1119)   sodium chloride     dexmedetomidine (PRECEDEX) IV infusion Stopped (12/13/21 1602)   lactated ringers     lactated ringers     lactated ringers 20 mL/hr at 12/15/21  1000   nitroGLYCERIN     phenylephrine (NEO-SYNEPHRINE) Adult infusion Stopped (12/13/21 2058)   PRN Meds:.sodium chloride, sodium chloride, dextrose, lactated ringers, metoprolol tartrate, midazolam, morphine injection, ondansetron (ZOFRAN) IV, oxyCODONE, sodium chloride flush, sodium chloride flush, traMADol  Xrays DG Chest Port 1 View  Result Date: 12/15/2021 CLINICAL DATA:  71 year old female status post CABG postoperative day 2. EXAM: PORTABLE CHEST 1 VIEW COMPARISON:  Portable chest 12/14/2021 and earlier. FINDINGS: Portable AP semi upright view at 0526 hours. Swan-Ganz catheter removed. Right IJ introducer sheath remains. Mediastinal and left chest tube remain. Continued low lung volumes. Stable cardiac size and mediastinal contours. Sequelae of CABG. No pneumothorax. No pulmonary edema, pulmonary vascularity appears stable to mildly improved. No definite pleural  effusion. Perihilar and lung base atelectasis greater on the left. Relatively normal appearing gas in the stomach. Stable visualized osseous structures. IMPRESSION: 1. Swan-Ganz catheter removed. Otherwise stable lines and tubes. 2. Atelectasis.  No pneumothorax or pulmonary edema. Electronically Signed   By: Genevie Ann M.D.   On: 12/15/2021 07:54    Assessment/Plan: S/P Procedure(s) (LRB): CORONARY ARTERY BYPASS GRAFTING (CABG) X  4  , ON PUMP, USING LEFT INTERNAL MAMMARY ARTERY AND RIGHT ENDOSCOPIC GREATER SAPHENOUS VEIN CONDUITS (N/A) TRANSESOPHAGEAL ECHOCARDIOGRAM (TEE) (N/A) ENDOVEIN HARVEST OF GREATER SAPHENOUS VEIN (Right) APPLICATION OF CELL SAVER  POD#3  1 afeb, VSS S BP 102-147 , sinus rhythm, occ PVC, will increase beta blocker dose- add low dose lisinopril 2 sats good on RA 3 UOP- not all recorded, weight stable for last 3 days, normal renal fxn, will add some diuretic 4 expected ABLA improved trend, minor 5 no leukocytosis, thrombocytopenia is resolved 6 push rehab and pulm hygiene 7 advance diet as able 8 CBG's adeq control- resume trulicity when intake better, prob at d/c  9 lovenox for DVT ppx 10 consider plavix with NSTEMI   LOS: 6 days    John Giovanni PA-C Pager [phone] 12/16/2021    Chart reviewed, patient examined, agree with above. She is making good progress for POD 3. Continue IS, ambulation. Lasix and Kdur ordered.

## 2021-12-16 NOTE — Care Management Important Message (Signed)
Important Message  Patient Details  Name: Katherine Pratt MRN: [id number] Date of Birth: 1951-10-12   Medicare Important Message Given:  Yes     Shelda Altes 12/16/2021, 9:37 AM

## 2021-12-16 NOTE — Progress Notes (Signed)
Mobility Specialist: Progress Note   12/16/21 1519  Mobility  Activity Ambulated in hall  Level of Assistance Minimal assist, patient does 75% or more  Assistive Device Front wheel walker  Distance Ambulated (ft) 220 ft  Mobility Ambulated with assistance in hallway  Mobility Response Tolerated well  Mobility performed by Mobility specialist  $Mobility charge 1 Mobility   Pre-Mobility: 59 HR Post-Mobility: 65 HR  Pt required minA to sit EOB as well as to stand. Pt to BR per request, BM successful, then agreeable to ambulation. No c/o throughout. Pt sitting EOB after walk with call bell at her side.  [facility] Sahej Schrieber Mobility Specialist Mobility Specialist 4 [place]: [phone] Mobility Specialist 2 [place] and [place]: [phone]

## 2021-12-16 NOTE — Progress Notes (Addendum)
Progress Note  Patient Name: Katherine Pratt Date of Encounter: 12/16/2021  [facility] Cardiologist: Lauree Chandler, MD   Subjective   Feeling okay. Chest soreness.   Inpatient Medications    Scheduled Meds:  acetaminophen  1,000 mg Oral Q6H   Or   acetaminophen (TYLENOL) oral liquid 160 mg/5 mL  1,000 mg Per Tube Q6H   aspirin EC  325 mg Oral Daily   Or   aspirin  324 mg Per Tube Daily   atorvastatin  80 mg Oral Daily   bisacodyl  10 mg Oral Daily   Or   bisacodyl  10 mg Rectal Daily   docusate sodium  200 mg Oral Daily   enoxaparin (LOVENOX) injection  40 mg Subcutaneous Q24H   furosemide  40 mg Oral Daily   insulin aspart  0-24 Units Subcutaneous TID WC   insulin detemir  10 Units Subcutaneous Daily   lisinopril  10 mg Oral Daily   metoprolol tartrate  25 mg Oral BID   Or   metoprolol tartrate  12.5 mg Per Tube BID   pantoprazole  40 mg Oral Daily   potassium chloride  20 mEq Oral BID   sodium chloride flush  3 mL Intravenous Q12H   sodium chloride flush  3 mL Intravenous Q12H   Continuous Infusions:  sodium chloride     sodium chloride     sodium chloride Stopped (12/14/21 1119)   sodium chloride     lactated ringers     lactated ringers 20 mL/hr at 12/15/21 1000   PRN Meds: sodium chloride, sodium chloride, dextrose, metoprolol tartrate, ondansetron (ZOFRAN) IV, oxyCODONE, sodium chloride flush, sodium chloride flush, traMADol   Vital Signs    Vitals:   12/15/21 2228 12/16/21 0002 12/16/21 0500 12/16/21 0859  BP: (!) 141/65 129/65 125/70 (!) 132/58  Pulse: 81 77 73 80  Resp:  20 20 20   Temp:  98.3 F (36.8 C) 98.9 F (37.2 C) 99.6 F (37.6 C)  TempSrc:  Oral Oral Oral  SpO2:  96% 95% 91%  Weight:  113.4 kg 113.4 kg   Height:        Intake/Output Summary (Last 24 hours) at 12/16/2021 0953 Last data filed at 12/16/2021 0858 Gross per 24 hour  Intake 271.96 ml  Output 800 ml  Net -528.04 ml   Last 3 Weights 12/16/2021 12/16/2021 12/15/2021   Weight (lbs) 250 lb 250 lb 250 lb 7.1 oz  Weight (kg) 113.399 kg 113.399 kg 113.6 kg      Telemetry    SR - Personally Reviewed  ECG    N/A  Physical Exam   GEN: No acute distress.   Neck: No JVD Cardiac: RRR, no murmurs, rubs, or gallops. Surgical dressing.  Respiratory: Clear to auscultation bilaterally. GI: Soft, nontender, non-distended  MS: No edema; No deformity. Neuro:  Nonfocal  Psych: Normal affect   Labs    High Sensitivity Troponin:   Recent Labs  Lab 12/09/21 1936 12/09/21 2148 12/10/21 0535 12/10/21 1407 12/10/21 1611  TROPONINIHS 288* 285* 226* 130* 126*     Chemistry Recent Labs  Lab 12/09/21 1936 12/11/21 0052 12/12/21 0343 12/13/21 1824 12/13/21 1958 12/14/21 0428 12/14/21 1602 12/15/21 0422 12/16/21 0119  NA 138 138   < > 136   140   < > 135 136 134* 136  K 3.5 3.7   < > 5.3*   5.3*   < > 4.4 4.2 4.5 4.0  CL 104 106   < >  112*  --  110 108 105 105  CO2 24 22   < > 21*  --  22 22 23 24   GLUCOSE 130* 169*   < > 169*  --  138* 193* 146* 131*  BUN 13 11   < > 11  --  9 14 19 20   CREATININE 0.98 0.88   < > 0.78  --  0.67 0.89 1.20* 1.02*  CALCIUM 8.8* 8.3*   < > 7.6*  --  8.1* 8.3* 8.4* 8.5*  MG  --   --   --  3.6*  --  3.0* 2.9*  --   --   PROT 7.1 5.8*  --   --   --   --   --   --   --   ALBUMIN 3.5 2.9*  --   --   --   --   --   --   --   AST 43* 25  --   --   --   --   --   --   --   ALT 57* 39  --   --   --   --   --   --   --   ALKPHOS 114 106  --   --   --   --   --   --   --   BILITOT 0.4 0.6  --   --   --   --   --   --   --   GFRNONAA >60 >60   < > >60  --  >60 >60 48* 59*  ANIONGAP 10 10   < > 3*  --  3* 6 6 7    < > = values in this interval not displayed.    Lipids  Recent Labs  Lab 12/10/21 1407  CHOL 154  TRIG 96  HDL 32*  LDLCALC 103*  CHOLHDL 4.8    Hematology Recent Labs  Lab 12/14/21 1602 12/15/21 0422 12/16/21 0119  WBC 9.8 10.1 9.7  RBC 3.30* 3.14* 3.30*  HGB 10.6* 10.2* 10.4*  HCT 31.6* 30.9*  32.4*  MCV 95.8 98.4 98.2  MCH 32.1 32.5 31.5  MCHC 33.5 33.0 32.1  RDW 12.7 13.2 13.2  PLT 136* 150 185   Thyroid  Recent Labs  Lab 12/10/21 1407  TSH 1.489    BNP Recent Labs  Lab 12/09/21 1936  BNP 123.5*    DDimer  Recent Labs  Lab 12/09/21 1936  DDIMER 0.63*     Radiology    DG Chest Port 1 View  Result Date: 12/15/2021 CLINICAL DATA:  71 year old female status post CABG postoperative day 2. EXAM: PORTABLE CHEST 1 VIEW COMPARISON:  Portable chest 12/14/2021 and earlier. FINDINGS: Portable AP semi upright view at 0526 hours. Swan-Ganz catheter removed. Right IJ introducer sheath remains. Mediastinal and left chest tube remain. Continued low lung volumes. Stable cardiac size and mediastinal contours. Sequelae of CABG. No pneumothorax. No pulmonary edema, pulmonary vascularity appears stable to mildly improved. No definite pleural effusion. Perihilar and lung base atelectasis greater on the left. Relatively normal appearing gas in the stomach. Stable visualized osseous structures. IMPRESSION: 1. Swan-Ganz catheter removed. Otherwise stable lines and tubes. 2. Atelectasis.  No pneumothorax or pulmonary edema. Electronically Signed   By: Genevie Ann M.D.   On: 12/15/2021 07:54    Cardiac Studies   Echo 12/11/21 1. Aortic valve appears calcified with indeterminant number of cusps; no  AS by doppler.  2. Left ventricular ejection fraction, by estimation, is 55 to 60%. The  left ventricle has normal function. The left ventricle has no regional  wall motion abnormalities. There is mild left ventricular hypertrophy.  Left ventricular diastolic parameters  are indeterminate.   3. Right ventricular systolic function is normal. The right ventricular  size is normal. There is normal pulmonary artery systolic pressure.   4. The mitral valve is normal in structure. Trivial mitral valve  regurgitation. No evidence of mitral stenosis.   5. The aortic valve is normal in structure. Aortic  valve regurgitation is  not visualized. No aortic stenosis is present.   6. The inferior vena cava is dilated in size with >50% respiratory  variability, suggesting right atrial pressure of 8 mmHg.   Cath 12/11/2021   Prox RCA lesion is 95% stenosed.   Mid RCA lesion is 70% stenosed.   Dist RCA lesion is 50% stenosed.   1st RPL lesion is 80% stenosed.   1st Mrg-1 lesion is 90% stenosed.   1st Mrg-2 lesion is 80% stenosed.   3rd Mrg lesion is 95% stenosed.   Mid LAD-1 lesion is 90% stenosed.   Prox LAD to Mid LAD lesion is 50% stenosed.   Mid LAD-2 lesion is 30% stenosed.   Dist LAD-2 lesion is 40% stenosed.   Dist LAD-1 lesion is 50% stenosed.   The left ventricular systolic function is normal.   LV end diastolic pressure is normal.   The left ventricular ejection fraction is 50-55% by visual estimate.   Severe multivessel CAD with coronary calcification and high-grade stenoses in all 3 major coronary arteries.   Preserved global LV contractility with EF estimate at 55% with subtle mid anterolateral hypocontractility.  LVEDP 15 mmHg.   RECOMMENDATION: Surgical consultation for consideration of CABG revascularization.  Increase anti-ischemic medical regimen.  Aggressive lipid-lowering therapy.  Patient Profile     71 y.o. female with PMH of HTN, HLD, DM II and obesity presented with NSTEMI. Cath showed multivessel CAD, now s/p CABG x4.  Assessment & Plan    NSTEMI/ CAD - Cath 12/11/2021 multivessel CAD with 95% prox RCA, 70% mid RCA, 50% distal RCA, 80% RPL, 90% OM1, 80% OM1, 95% OM3, 90% mid LAD, 50% prox to mid LAD, 50% distal LAD. EF 50-55% - Echo 12/11/2021 EF 55-60%, mild LVH, trivial MR.  Left internal mammary artery graft to the LAD SVG to OM1 SVG to OM2 SVG to PDA - Continue ASA and statin - On BB (see below)  2. First Degree AV block - Was not on any rate control agent prior to arrival  - Bradycardia in 50s on admit - EKG 1/2 - PR interval 265ms - EKG 1/5 - PR  interval 23ms - EKG 1/7 - PR interval 350ms - His metoprolol has increased from 12.5mg  BID to 25mg  BID (already got dose this morning). Will repeat EKG. Could consider Coreg at lower dose  3. HLD - 12/10/2021: Cholesterol 154; HDL 32; LDL Cholesterol 103; Triglycerides 96; VLDL 19  - Continue Lipitor 80mg  qd  4. HTN - Added lisinopril -  Home amlodipine on hold  - BB as above  5.  T2CM - Continue current therapy - Start Jardiance 10mg  Qday.  For questions or updates, please contact [place] Please consult [email or web address] for contact info under        Signed, Leanor Kail, PA  12/16/2021, 9:53 AM     ATTENDING ATTESTATION:  After conducting a review of all available clinical  information with the care team, interviewing the patient, and performing a physical exam, I agree with the findings and plan described in this note.   GEN: No acute distress.   Cardiac: RRR, no murmurs, rubs, or gallops.  Respiratory: Coarse. GI: Soft, nontender, non-distended  MS: No edema; No deformity. Neuro:  Nonfocal   Patient doing well after MV CABG.  Cont current medical therapy with ASA, BB, statin.  Net negative overnight.  Given history of T2DM, will start Jardiance 10mg  qday.    Lenna Sciara, MD Pager [phone]

## 2021-12-16 NOTE — Progress Notes (Signed)
CARDIAC REHAB PHASE I   PRE:  Rate/Rhythm: 23 JR, 65 first deg    BP: sitting 122/61    SaO2: 93 RA  MODE:  Ambulation: 50 ft   POST:  Rate/Rhythm: 85 JR    BP: sitting 124/65     SaO2: 95 RA  Pt in recliner, sleeping. Upon awaking pt c/o diaphoresis. She is warm to touch but temp 98.5. Pt with difficulty moving hips to edge of recliner. Attempt x3 with rocking to stand over 10-15 min, some anxiety, she sts related to being hot and diaphoretic. Upon standing moved to [facility]. Once cleaned, pt stood with rocking and walked with RW, gait belt, assist x1. Slow pace. With distance c/o feeling weak/fatigued and wanting to hurry to sit. Pt also having feelings of claustrophobia in her clothes/wires. Pt made it to EOB and BP stable. Practiced IS, 550 ml. Left on EOB due to still feeling hot.  [id number]  Darrick Meigs CES, ACSM 12/16/2021 11:19 AM

## 2021-12-16 NOTE — Anesthesia Postprocedure Evaluation (Signed)
Anesthesia Post Note  Patient: Amaria Mundorf  Procedure(s) Performed: CORONARY ARTERY BYPASS GRAFTING (CABG) X  4  , ON PUMP, USING LEFT INTERNAL MAMMARY ARTERY AND RIGHT ENDOSCOPIC GREATER SAPHENOUS VEIN CONDUITS (Chest) TRANSESOPHAGEAL ECHOCARDIOGRAM (TEE) ENDOVEIN HARVEST OF GREATER SAPHENOUS VEIN (Right) APPLICATION OF CELL SAVER     Patient location during evaluation: SICU Anesthesia Type: General Level of consciousness: sedated Pain management: pain level controlled Vital Signs Assessment: post-procedure vital signs reviewed and stable Respiratory status: patient remains intubated per anesthesia plan Cardiovascular status: stable Postop Assessment: no apparent nausea or vomiting Anesthetic complications: no   No notable events documented.  Last Vitals:  Vitals:   12/16/21 0500 12/16/21 0859  BP: 125/70 (!) 132/58  Pulse: 73 80  Resp: 20 20  Temp: 37.2 C 37.6 C  SpO2: 95% 91%    Last Pain:  Vitals:   12/16/21 0859  TempSrc: Oral  PainSc:                  Kellyn Mccary

## 2021-12-16 NOTE — Progress Notes (Signed)
[place] for enoxaparin Indication: VTE prophylaxis  Allergies  Allergen Reactions   Aspirin Other (See Comments)    On meloxicam   Keflex [Cephalexin] Hives   Losartan Rash    Patient Measurements: Height: 5\' 5"  (165.1 cm) Weight: 113.4 kg (250 lb) IBW/kg (Calculated) : 57  Vital Signs: Temp: 98.9 F (37.2 C) (01/09 0500) Temp Source: Oral (01/09 0500) BP: 125/70 (01/09 0500) Pulse Rate: 73 (01/09 0500)  Labs: Recent Labs    12/13/21 1323 12/13/21 1326 12/14/21 1602 12/15/21 0422 12/16/21 0119  HGB 10.6*   < > 10.6* 10.2* 10.4*  HCT 32.6*   < > 31.6* 30.9* 32.4*  PLT 143*   < > 136* 150 185  APTT 37*  --   --   --   --   LABPROT 18.5*  --   --   --   --   INR 1.5*  --   --   --   --   CREATININE  --    < > 0.89 1.20* 1.02*   < > = values in this interval not displayed.    Estimated Creatinine Clearance: 63.6 mL/min (A) (by C-G formula based on SCr of 1.02 mg/dL (H)).   Medical History: Past Medical History:  Diagnosis Date   Breast cancer ([place])    Diabetes mellitus without complication ([place])    HLD (hyperlipidemia) 08/01/2015   Hypertension     Assessment: 28 yoF s/p CABG 1/6. Pharmacy consulted to dose enoxaparin for VTE ppx. CBC stable, chest tubes out.  Plan:  Enoxaparin 40mg  SQ daily Pharmacy will sign off, reconsult if needed  Arrie Senate, PharmD, BCPS, [facility] Clinical Pharmacist [phone] Please check AMION for all [place] numbers 12/16/2021

## 2021-12-17 LAB — GLUCOSE, CAPILLARY
Glucose-Capillary: 121 mg/dL — ABNORMAL HIGH (ref 70–99)
Glucose-Capillary: 142 mg/dL — ABNORMAL HIGH (ref 70–99)
Glucose-Capillary: 137 mg/dL — ABNORMAL HIGH (ref 70–99)
Glucose-Capillary: 166 mg/dL — ABNORMAL HIGH (ref 70–99)

## 2021-12-17 MED ORDER — METOPROLOL TARTRATE 12.5 MG HALF TABLET
12.5000 mg | ORAL_TABLET | Freq: Two times a day (BID) | ORAL | Status: DC
  Administered 2021-12-17 (×2): 12.5 mg via ORAL
  Filled 2021-12-17 (×2): qty 1

## 2021-12-17 MED ORDER — EMPAGLIFLOZIN 10 MG PO TABS
10.0000 mg | ORAL_TABLET | Freq: Every day | ORAL | Status: DC
  Administered 2021-12-17 – 2021-12-20 (×4): 10 mg via ORAL
  Filled 2021-12-17 (×4): qty 1

## 2021-12-17 MED ORDER — METOPROLOL TARTRATE 25 MG/10 ML ORAL SUSPENSION: 12.5000 mg | Freq: Two times a day (BID) | ORAL | Status: DC

## 2021-12-17 MED FILL — Potassium Chloride Inj 2 mEq/ML: INTRAVENOUS | Qty: 40 | Status: AC

## 2021-12-17 MED FILL — Magnesium Sulfate Inj 50%: INTRAMUSCULAR | Qty: 10 | Status: AC

## 2021-12-17 MED FILL — Albumin, Human Inj 5%: INTRAVENOUS | Qty: 250 | Status: AC

## 2021-12-17 MED FILL — Lidocaine HCl (Cardiac) IV PF Soln 100 MG/5ML (2%): INTRAVENOUS | Qty: 5 | Status: AC

## 2021-12-17 MED FILL — Sodium Chloride IV Soln 0.9%: INTRAVENOUS | Qty: 2000 | Status: AC

## 2021-12-17 MED FILL — Mannitol IV Soln 20%: INTRAVENOUS | Qty: 500 | Status: AC

## 2021-12-17 MED FILL — Heparin Sodium (Porcine) Inj 1000 Unit/ML: INTRAMUSCULAR | Qty: 20 | Status: AC

## 2021-12-17 MED FILL — Electrolyte-R (PH 7.4) Solution: INTRAVENOUS | Qty: 3000 | Status: AC

## 2021-12-17 MED FILL — Sodium Bicarbonate IV Soln 8.4%: INTRAVENOUS | Qty: 50 | Status: AC

## 2021-12-17 MED FILL — Lidocaine HCl Local Preservative Free (PF) Inj 2%: INTRAMUSCULAR | Qty: 15 | Status: AC

## 2021-12-17 MED FILL — Heparin Sodium (Porcine) Inj 1000 Unit/ML: Qty: 1000 | Status: AC

## 2021-12-17 NOTE — Progress Notes (Addendum)
[address]       [place] [address]             [phone]      4 Days Post-Op Procedure(s) (LRB): CORONARY ARTERY BYPASS GRAFTING (CABG) X  4  , ON PUMP, USING LEFT INTERNAL MAMMARY ARTERY AND RIGHT ENDOSCOPIC GREATER SAPHENOUS VEIN CONDUITS (N/A) TRANSESOPHAGEAL ECHOCARDIOGRAM (TEE) (N/A) ENDOVEIN HARVEST OF GREATER SAPHENOUS VEIN (Right) APPLICATION OF CELL SAVER Subjective: Slowly feeling better. And stronger  Objective: Vital signs in last 24 hours: Temp:  [97.9 F (36.6 C)-99.6 F (37.6 C)] 97.9 F (36.6 C) (01/10 0348) Pulse Rate:  [58-80] 58 (01/10 0348) Cardiac Rhythm: Normal sinus rhythm;Heart block (01/09 1900) Resp:  [16-20] 19 (01/10 0348) BP: (119-138)/(53-65) 138/55 (01/10 0348) SpO2:  [91 %-100 %] 98 % (01/10 0348) Weight:  [112.1 kg] 112.1 kg (01/10 0642)  Hemodynamic parameters for last 24 hours:    Intake/Output from previous day: 01/09 0701 - 01/10 0700 In: 733 [P.O.:730; I.V.:3] Out: 450 [Urine:450] Intake/Output this shift: No intake/output data recorded.  General appearance: alert, cooperative, and no distress Heart: regular rate and rhythm Lungs: dim in bases Abdomen: benign Extremities: edema is improved Wound: incis healing well EVH- remove aquaseal dressing today  Lab Results: Recent Labs    12/15/21 0422 12/16/21 0119  WBC 10.1 9.7  HGB 10.2* 10.4*  HCT 30.9* 32.4*  PLT 150 185   BMET:  Recent Labs    12/15/21 0422 12/16/21 0119  NA 134* 136  K 4.5 4.0  CL 105 105  CO2 23 24  GLUCOSE 146* 131*  BUN 19 20  CREATININE 1.20* 1.02*  CALCIUM 8.4* 8.5*    PT/INR: No results for input(s): LABPROT, INR in the last 72 hours. ABG    Component Value Date/Time   PHART 7.300 (L) 12/13/2021 1958   HCO3 21.7 12/13/2021 1958   TCO2 23 12/13/2021 1958   ACIDBASEDEF 5.0 (H) 12/13/2021 1958   O2SAT 98.0 12/13/2021 1958   CBG (last 3)  Recent Labs    12/16/21 1712 12/16/21 2011 12/17/21 0637  GLUCAP  131* 156* 121*    Meds Scheduled Meds:  acetaminophen  1,000 mg Oral Q6H   Or   acetaminophen (TYLENOL) oral liquid 160 mg/5 mL  1,000 mg Per Tube Q6H   aspirin EC  325 mg Oral Daily   Or   aspirin  324 mg Per Tube Daily   atorvastatin  80 mg Oral Daily   bisacodyl  10 mg Oral Daily   Or   bisacodyl  10 mg Rectal Daily   clopidogrel  75 mg Oral Daily   docusate sodium  200 mg Oral Daily   enoxaparin (LOVENOX) injection  40 mg Subcutaneous Q24H   furosemide  40 mg Oral Daily   insulin aspart  0-24 Units Subcutaneous TID WC   insulin detemir  10 Units Subcutaneous Daily   lisinopril  10 mg Oral Daily   metoprolol tartrate  25 mg Oral BID   Or   metoprolol tartrate  12.5 mg Per Tube BID   pantoprazole  40 mg Oral Daily   sodium chloride flush  3 mL Intravenous Q12H   sodium chloride flush  3 mL Intravenous Q12H   Continuous Infusions:  sodium chloride     sodium chloride     sodium chloride Stopped (12/14/21 1119)   sodium chloride     lactated ringers     lactated ringers 20 mL/hr at 12/15/21  1000   PRN Meds:.sodium chloride, sodium chloride, dextrose, metoprolol tartrate, ondansetron (ZOFRAN) IV, oxyCODONE, sodium chloride flush, sodium chloride flush, traMADol  Xrays No results found.  Assessment/Plan: S/P Procedure(s) (LRB): CORONARY ARTERY BYPASS GRAFTING (CABG) X  4  , ON PUMP, USING LEFT INTERNAL MAMMARY ARTERY AND RIGHT ENDOSCOPIC GREATER SAPHENOUS VEIN CONDUITS (N/A) TRANSESOPHAGEAL ECHOCARDIOGRAM (TEE) (N/A) ENDOVEIN HARVEST OF GREATER SAPHENOUS VEIN (Right) APPLICATION OF CELL SAVER POD#4   1 Tmax 99.6 , VSS, sinus rhythm, sinus brady, PVC's, will decrease metoprolol back to 12.5 BID 2 sats good on RA 3 voiding well, amounts not fully recorded, weight trending down on lasix 4 BS controlled- resume trulicity at d/c 5 on asa, plavix, beta blocker, statin and ACE-I, lovenox for DVT PPx 6 some anxiety with mobilization but improving, push pulm toilet/rehab  as able 7 poss home in am, recheck labs in am   LOS: 7 days    John Giovanni PA-C Pager [phone] 12/17/2021    Chart reviewed, patient examined, agree with above. She is making good progress. Removed Aquacell dressing and incision looks good.  He daughter [place] out of [place] and won't be here until Friday to take her home.

## 2021-12-17 NOTE — Progress Notes (Addendum)
CARDIAC REHAB PHASE I   PRE:  Rate/Rhythm: 67 first deg    BP: sitting 137/55    SaO2: 95 RA  MODE:  Ambulation: 310 ft   POST:  Rate/Rhythm: 85 SR first deg with PVCs    BP: sitting 157/72     SaO2: 98 RA  Pt much improved today. Able to stand from recliner and walk with RW. Slow and steady, used gait belt for light support. Rest briefly x2. To EOB, VSS. Encouraged x2 more walks and IS. Needs RW for home. [place], ACSM 12/17/2021 9:09 AM

## 2021-12-17 NOTE — Progress Notes (Addendum)
Overnight had bradycardia/junction rhythm. EKG yesterday with PR internal of 226ms. Agree with reducing metoprolol.   [facility] will sign off.   Medication Recommendations:  ASA (dose per surgery), Plavix, Lipitor, Lisinopril, Jardiance and metoprolol 12.5mg  BID (will not up-titrate) Other recommendations (labs, testing, etc):  None Follow up as an outpatient:  Arranged on 01/10/22  ATTENDING ATTESTATION:  After conducting a review of all available clinical information with the care team, interviewing the patient, and performing a physical exam, I agree with the findings and plan described in this note.   GEN: No acute distress.   Cardiac: RRR, no murmurs, rubs, or gallops.  Respiratory: Clear to auscultation bilaterally. GI: Soft, nontender, non-distended  MS: No edema; No deformity. Neuro:  Nonfocal   Patient doing well after uncomplicated MV CABG.  Cont current meds.  Start Jardiance 10mg  today.  Reduce metoprolol to 12.5mg  BID.  Will sign off, call with ?s.  Lenna Sciara, MD Pager [phone]

## 2021-12-17 NOTE — Progress Notes (Signed)
Mobility Specialist: Progress Note   12/17/21 1642  Mobility  Activity Ambulated in hall  Level of Assistance Contact guard assist, steadying assist  Assistive Device Front wheel walker  Distance Ambulated (ft) 330 ft  Mobility Ambulated with assistance in hallway  Mobility Response Tolerated well  Mobility performed by Mobility specialist  $Mobility charge 1 Mobility   Pre-Mobility: 65 HR Post-Mobility: 66 HR  Pt stopped x2 for brief standing breaks d/t feeling SOB, otherwise no c/o. Pt sitting EOB after walk with call bell and phone at her side.   [facility] Daved Mcfann Mobility Specialist Mobility Specialist 4 [place]: [phone] Mobility Specialist 2 [place] and [place]: [phone]

## 2021-12-18 LAB — CBC
HCT: 30.9 % — ABNORMAL LOW (ref 36.0–46.0)
Hemoglobin: 9.9 g/dL — ABNORMAL LOW (ref 12.0–15.0)
MCH: 30.8 pg (ref 26.0–34.0)
MCHC: 32 g/dL (ref 30.0–36.0)
MCV: 96.3 fL (ref 80.0–100.0)
Platelets: 234 10*3/uL (ref 150–400)
RBC: 3.21 MIL/uL — ABNORMAL LOW (ref 3.87–5.11)
RDW: 13.2 % (ref 11.5–15.5)
WBC: 7.9 10*3/uL (ref 4.0–10.5)
nRBC: 0 % (ref 0.0–0.2)

## 2021-12-18 LAB — BASIC METABOLIC PANEL
Anion gap: 8 (ref 5–15)
BUN: 20 mg/dL (ref 8–23)
CO2: 23 mmol/L (ref 22–32)
Calcium: 8.3 mg/dL — ABNORMAL LOW (ref 8.9–10.3)
Chloride: 109 mmol/L (ref 98–111)
Creatinine, Ser: 1.02 mg/dL — ABNORMAL HIGH (ref 0.44–1.00)
GFR, Estimated: 59 mL/min — ABNORMAL LOW (ref >60)
Glucose, Bld: 116 mg/dL — ABNORMAL HIGH (ref 70–99)
Potassium: 3.8 mmol/L (ref 3.5–5.1)
Sodium: 140 mmol/L (ref 135–145)

## 2021-12-18 LAB — GLUCOSE, CAPILLARY
Glucose-Capillary: 106 mg/dL — ABNORMAL HIGH (ref 70–99)
Glucose-Capillary: 161 mg/dL — ABNORMAL HIGH (ref 70–99)
Glucose-Capillary: 119 mg/dL — ABNORMAL HIGH (ref 70–99)
Glucose-Capillary: 133 mg/dL — ABNORMAL HIGH (ref 70–99)

## 2021-12-18 MED ORDER — ASPIRIN EC 81 MG PO TBEC
81.0000 mg | DELAYED_RELEASE_TABLET | Freq: Every day | ORAL | Status: DC
  Administered 2021-12-19 – 2021-12-20 (×2): 81 mg via ORAL
  Filled 2021-12-18 (×2): qty 1

## 2021-12-18 NOTE — Progress Notes (Signed)
°   12/18/21 2025  Clinical Encounter Type  Visited With Patient  Visit Type Follow-up  Referral From Nurse  Consult/Referral To Chaplain   Chaplain Jorene Guest responded to the consult request for an Advance Directive. The patient said she still needed to speak with her friend, Pamala Hurry, about being her HCPOA. The patient transitioned and explained what led her to be hospitalized. Chaplain Marjie Chea sat beside the patient and provided a listening presence as the patient engaged in storytelling. She explored her faith and values while speaking of her deceased husband was a former Theme park manager. 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 The patient said she recognizes God's healing presence in her life and why she survived the procedure. As for post-discharge, the patient said she has a strong support system with her church community. Ike Bene ended the visit with prayer. This note was prepared by Jeanine Luz, M.Div..  For questions please contact by phone [phone].

## 2021-12-18 NOTE — Progress Notes (Signed)
Mobility Specialist: Progress Note   12/18/21 1456  Mobility  Activity Ambulated in hall  Level of Assistance Modified independent, requires aide device or extra time  Assistive Device Front wheel walker  Distance Ambulated (ft) 420 ft  Mobility Ambulated with assistance in hallway  Mobility Response Tolerated well  Mobility performed by Mobility specialist  Bed Position Chair  $Mobility charge 1 Mobility   Post-Mobility: 82 HR  Pt received in chair and agreeable to mobility. C/o SOB throughout requiring x2 standing breaks. Pt to chair after session with all needs met.    [facility] Luci Bellucci Mobility Specialist Mobility Specialist 4 [place]: [phone] Mobility Specialist 2 [place] and [place]: [phone]

## 2021-12-18 NOTE — Progress Notes (Addendum)
[address]       [place] [address]             [phone]      5 Days Post-Op Procedure(s) (LRB): CORONARY ARTERY BYPASS GRAFTING (CABG) X  4  , ON PUMP, USING LEFT INTERNAL MAMMARY ARTERY AND RIGHT ENDOSCOPIC GREATER SAPHENOUS VEIN CONDUITS (N/A) TRANSESOPHAGEAL ECHOCARDIOGRAM (TEE) (N/A) ENDOVEIN HARVEST OF GREATER SAPHENOUS VEIN (Right) APPLICATION OF CELL SAVER Subjective: Feeling better each day  Objective: Vital signs in last 24 hours: Temp:  [98.2 F (36.8 C)-99.3 F (37.4 C)] 98.2 F (36.8 C) (01/11 0421) Pulse Rate:  [61-84] 61 (01/11 0421) Cardiac Rhythm: Normal sinus rhythm (01/10 1947) Resp:  [16-20] 20 (01/11 0421) BP: (129-157)/(54-72) 141/58 (01/11 0421) SpO2:  [96 %-100 %] 96 % (01/11 0421) Weight:  [112.1 kg] 112.1 kg (01/11 0421)  Hemodynamic parameters for last 24 hours:    Intake/Output from previous day: 01/10 0701 - 01/11 0700 In: 1080.9 [P.O.:1080; I.V.:0.9] Out: 600 [Urine:600] Intake/Output this shift: No intake/output data recorded.  General appearance: alert, cooperative, and no distress Heart: regular rate and rhythm and brady Lungs: clear to auscultation bilaterally Abdomen: benign Extremities: + LE edema Wound: incis healing well  Lab Results: Recent Labs    12/16/21 0119 12/18/21 0435  WBC 9.7 7.9  HGB 10.4* 9.9*  HCT 32.4* 30.9*  PLT 185 234   BMET:  Recent Labs    12/16/21 0119 12/18/21 0435  NA 136 140  K 4.0 3.8  CL 105 109  CO2 24 23  GLUCOSE 131* 116*  BUN 20 20  CREATININE 1.02* 1.02*  CALCIUM 8.5* 8.3*    PT/INR: No results for input(s): LABPROT, INR in the last 72 hours. ABG    Component Value Date/Time   PHART 7.300 (L) 12/13/2021 1958   HCO3 21.7 12/13/2021 1958   TCO2 23 12/13/2021 1958   ACIDBASEDEF 5.0 (H) 12/13/2021 1958   O2SAT 98.0 12/13/2021 1958   CBG (last 3)  Recent Labs    12/17/21 1618 12/17/21 2105 12/18/21 0556  GLUCAP 137* 166* 106*     Meds Scheduled Meds:  acetaminophen  1,000 mg Oral Q6H   Or   acetaminophen (TYLENOL) oral liquid 160 mg/5 mL  1,000 mg Per Tube Q6H   aspirin EC  325 mg Oral Daily   Or   aspirin  324 mg Per Tube Daily   atorvastatin  80 mg Oral Daily   bisacodyl  10 mg Oral Daily   Or   bisacodyl  10 mg Rectal Daily   clopidogrel  75 mg Oral Daily   docusate sodium  200 mg Oral Daily   empagliflozin  10 mg Oral Daily   enoxaparin (LOVENOX) injection  40 mg Subcutaneous Q24H   furosemide  40 mg Oral Daily   insulin aspart  0-24 Units Subcutaneous TID WC   insulin detemir  10 Units Subcutaneous Daily   lisinopril  10 mg Oral Daily   metoprolol tartrate  12.5 mg Oral BID   Or   metoprolol tartrate  12.5 mg Per Tube BID   pantoprazole  40 mg Oral Daily   sodium chloride flush  3 mL Intravenous Q12H   sodium chloride flush  3 mL Intravenous Q12H   Continuous Infusions:  sodium chloride     sodium chloride     sodium chloride Stopped (12/14/21 1119)   sodium chloride     lactated ringers     lactated ringers  20 mL/hr at 12/15/21 1000   PRN Meds:.sodium chloride, sodium chloride, dextrose, metoprolol tartrate, ondansetron (ZOFRAN) IV, oxyCODONE, sodium chloride flush, sodium chloride flush, traMADol  Xrays No results found.  Assessment/Plan: S/P Procedure(s) (LRB): CORONARY ARTERY BYPASS GRAFTING (CABG) X  4  , ON PUMP, USING LEFT INTERNAL MAMMARY ARTERY AND RIGHT ENDOSCOPIC GREATER SAPHENOUS VEIN CONDUITS (N/A) TRANSESOPHAGEAL ECHOCARDIOGRAM (TEE) (N/A) ENDOVEIN HARVEST OF GREATER SAPHENOUS VEIN (Right) APPLICATION OF CELL SAVER POD#5  1 Tmax 99.3, sBP 129-157, sinus rhythm/sinus brady, lopressor decreased yesterday, follow parameters but may need to stop 2 sats good on RA 3 good UOP, cont to diurese 4 creat stable at 1.02 5 H/H slightly lower but stable 08/16/29.9 6 CBG good control 7 sister will be available Friday  and other friend who will be helping her at home has a "cold"-  cont rehab and pulm hygiene   LOS: 8 days    John Giovanni PA-C Pager [phone] 12/18/2021    Chart reviewed, patient examined, agree with above. HR is sinus brady 50's this am. Will stop Lopressor. If BP remains elevated we can increase lisinopril and/or resume Norvasc.

## 2021-12-18 NOTE — Progress Notes (Signed)
CARDIAC REHAB PHASE I   PRE:  Rate/Rhythm: 68 sR    BP: sitting 151/57    SaO2: 96 RA  MODE:  Ambulation: 410 ft   POST:  Rate/Rhythm: 104 ST with PVCs    BP: sitting 158/59     SaO2: 96 RA  Pt stood slowly independently and walked hall with RW. Slow and steady. Increased ectopy. SOB with rest x1. Return to recliner. [phone]   [place], ACSM 12/18/2021 11:24 AM

## 2021-12-19 LAB — GLUCOSE, CAPILLARY
Glucose-Capillary: 104 mg/dL — ABNORMAL HIGH (ref 70–99)
Glucose-Capillary: 115 mg/dL — ABNORMAL HIGH (ref 70–99)
Glucose-Capillary: 140 mg/dL — ABNORMAL HIGH (ref 70–99)
Glucose-Capillary: 177 mg/dL — ABNORMAL HIGH (ref 70–99)

## 2021-12-19 MED ORDER — POTASSIUM CHLORIDE CRYS ER 20 MEQ PO TBCR
40.0000 meq | EXTENDED_RELEASE_TABLET | Freq: Once | ORAL | Status: AC
  Administered 2021-12-19: 40 meq via ORAL
  Filled 2021-12-19: qty 2

## 2021-12-19 MED ORDER — AMLODIPINE BESYLATE 10 MG PO TABS
10.0000 mg | ORAL_TABLET | Freq: Every day | ORAL | Status: DC
  Administered 2021-12-19 – 2021-12-20 (×2): 10 mg via ORAL
  Filled 2021-12-19 (×2): qty 1

## 2021-12-19 MED ORDER — ALUM & MAG HYDROXIDE-SIMETH 200-200-20 MG/5ML PO SUSP
30.0000 mL | Freq: Four times a day (QID) | ORAL | Status: DC | PRN
  Administered 2021-12-19: 30 mL via ORAL
  Filled 2021-12-19: qty 30

## 2021-12-19 MED ORDER — METOPROLOL TARTRATE 12.5 MG HALF TABLET
12.5000 mg | ORAL_TABLET | Freq: Two times a day (BID) | ORAL | Status: DC
  Administered 2021-12-19 – 2021-12-20 (×3): 12.5 mg via ORAL
  Filled 2021-12-19 (×3): qty 1

## 2021-12-19 NOTE — Progress Notes (Signed)
CARDIAC REHAB PHASE I   PRE:  Rate/Rhythm: 71 SR    BP: sitting 144/64    SaO2: 95 RA  MODE:  Ambulation: 410 ft   POST:  Rate/Rhythm: 116 ? Accelerated junctional, increased PVCs    BP: sitting 158/64     SaO2: 98 RA  Pt stood and ambulated with standby assist and RW. Slow and steady. HR noted to be accelerated without p wave, having PVCs. Pt admitted to SOB but blames mask. Rested briefly and return to room. Rhythm returned to NSR 80 with rest.  [phone]  [place], ACSM 12/19/2021 10:27 AM

## 2021-12-19 NOTE — Progress Notes (Signed)
Mobility Specialist: Progress Note   12/19/21 1644  Mobility  Activity Ambulated in hall  Level of Assistance Modified independent, requires aide device or extra time  Assistive Device Front wheel walker  Distance Ambulated (ft) 430 ft  Mobility Ambulated with assistance in hallway  Mobility Response Tolerated well  Mobility performed by Mobility specialist  $Mobility charge 1 Mobility   Pre-Mobility: 77 HR Post-Mobility: 80 HR, 144/77 BP  Pt independent to stand from recliner stating she feels much better compared to earlier today. Stopped x2 for brief standing breaks d/t SOB, otherwise asymptomatic. Pt back to bed after walk per request with call bell and phone in reach.   [facility] Henegar Mobility Specialist Mobility Specialist [address] [place]: [phone] Mobility Specialist 2 [place] and [place]: [phone]

## 2021-12-19 NOTE — Progress Notes (Addendum)
[address]       [place] [address]             [phone]      6 Days Post-Op Procedure(s) (LRB): CORONARY ARTERY BYPASS GRAFTING (CABG) X  4  , ON PUMP, USING LEFT INTERNAL MAMMARY ARTERY AND RIGHT ENDOSCOPIC GREATER SAPHENOUS VEIN CONDUITS (N/A) TRANSESOPHAGEAL ECHOCARDIOGRAM (TEE) (N/A) ENDOVEIN HARVEST OF GREATER SAPHENOUS VEIN (Right) APPLICATION OF CELL SAVER Subjective: Feels ok, some SOB with ambulation she attributes to wearing mask  Objective: Vital signs in last 24 hours: Temp:  [97.9 F (36.6 C)-98.6 F (37 C)] 98.5 F (36.9 C) (01/12 0453) Pulse Rate:  [56-84] 75 (01/12 0453) Cardiac Rhythm: Normal sinus rhythm (01/11 1911) Resp:  [16-21] 19 (01/12 0453) BP: (130-165)/(55-75) 151/63 (01/12 0453) SpO2:  [94 %-100 %] 97 % (01/12 0453) Weight:  [400 kg] 112 kg (01/12 0453)  Hemodynamic parameters for last 24 hours:    Intake/Output from previous day: 01/11 0701 - 01/12 0700 In: 480 [P.O.:480] Out: 1000 [Urine:1000] Intake/Output this shift: No intake/output data recorded.  General appearance: alert, cooperative, and no distress Heart: regular rate and rhythm and occas extrasystole Lungs: clear to auscultation bilaterally Abdomen: benign Extremities: + pedal edema Wound: incis healing well  Lab Results: Recent Labs    12/18/21 0435  WBC 7.9  HGB 9.9*  HCT 30.9*  PLT 234   BMET:  Recent Labs    12/18/21 0435  NA 140  K 3.8  CL 109  CO2 23  GLUCOSE 116*  BUN 20  CREATININE 1.02*  CALCIUM 8.3*    PT/INR: No results for input(s): LABPROT, INR in the last 72 hours. ABG    Component Value Date/Time   PHART 7.300 (L) 12/13/2021 1958   HCO3 21.7 12/13/2021 1958   TCO2 23 12/13/2021 1958   ACIDBASEDEF 5.0 (H) 12/13/2021 1958   O2SAT 98.0 12/13/2021 1958   CBG (last 3)  Recent Labs    12/18/21 1637 12/18/21 2110 12/19/21 0604  GLUCAP 119* 133* 104*    Meds Scheduled Meds:  aspirin EC  81 mg Oral Daily    atorvastatin  80 mg Oral Daily   bisacodyl  10 mg Oral Daily   Or   bisacodyl  10 mg Rectal Daily   clopidogrel  75 mg Oral Daily   docusate sodium  200 mg Oral Daily   empagliflozin  10 mg Oral Daily   enoxaparin (LOVENOX) injection  40 mg Subcutaneous Q24H   furosemide  40 mg Oral Daily   insulin aspart  0-24 Units Subcutaneous TID WC   insulin detemir  10 Units Subcutaneous Daily   lisinopril  10 mg Oral Daily   pantoprazole  40 mg Oral Daily   sodium chloride flush  3 mL Intravenous Q12H   sodium chloride flush  3 mL Intravenous Q12H   Continuous Infusions:  sodium chloride     sodium chloride     sodium chloride Stopped (12/14/21 1119)   sodium chloride     lactated ringers     lactated ringers 20 mL/hr at 12/15/21 1000   PRN Meds:.sodium chloride, sodium chloride, dextrose, ondansetron (ZOFRAN) IV, oxyCODONE, sodium chloride flush, sodium chloride flush, traMADol  Xrays No results found.  Assessment/Plan: S/P Procedure(s) (LRB): CORONARY ARTERY BYPASS GRAFTING (CABG) X  4  , ON PUMP, USING LEFT INTERNAL MAMMARY ARTERY AND RIGHT ENDOSCOPIC GREATER SAPHENOUS VEIN CONDUITS (N/A) TRANSESOPHAGEAL ECHOCARDIOGRAM (TEE) (N/A) ENDOVEIN HARVEST OF GREATER SAPHENOUS VEIN (  Right) APPLICATION OF CELL SAVER POD#6  1 afeb, VSS S BP 130's-160's- will resume norvasc at home dose, sinus rhythm but more frequent ectopy, EFx 55-60 preop - beta blocker stopped yesterday, D/W Dr Cyndia Bent , will resume at 12.5 bid 2 sats good on RA 3 voiding well, UOP not accurate, weight stable 4 BS well controlled , jardiance started per cardiology yesterday 5 cont ASA 81/Plavix 75 6 SOB with mobility- push rehab and pulm hygiene as able 7 home soon- family /friends assisting her will be available tomorrow  LOS: 9 days    John Giovanni PA-C Pager [phone] 12/19/2021    Chart reviewed, patient examined, agree with above. Beta blocker resumed for more frequent ectopy. Her HR is 70's this am. Plan  home tomorrow.

## 2021-12-20 LAB — BASIC METABOLIC PANEL
Anion gap: 12 (ref 5–15)
BUN: 12 mg/dL (ref 8–23)
CO2: 23 mmol/L (ref 22–32)
Calcium: 8.8 mg/dL — ABNORMAL LOW (ref 8.9–10.3)
Chloride: 105 mmol/L (ref 98–111)
Creatinine, Ser: 0.98 mg/dL (ref 0.44–1.00)
GFR, Estimated: >60 mL/min (ref >60)
Glucose, Bld: 121 mg/dL — ABNORMAL HIGH (ref 70–99)
Potassium: 4.3 mmol/L (ref 3.5–5.1)
Sodium: 140 mmol/L (ref 135–145)

## 2021-12-20 LAB — GLUCOSE, CAPILLARY: Glucose-Capillary: 134 mg/dL — ABNORMAL HIGH (ref 70–99)

## 2021-12-20 MED ORDER — ATORVASTATIN CALCIUM 80 MG PO TABS: 80.0000 mg | ORAL_TABLET | Freq: Every day | ORAL | 1 refills | Status: DC

## 2021-12-20 MED ORDER — EMPAGLIFLOZIN 10 MG PO TABS: 10.0000 mg | ORAL_TABLET | Freq: Every day | ORAL | 1 refills | Status: DC

## 2021-12-20 MED ORDER — METOPROLOL TARTRATE 25 MG PO TABS: 12.5000 mg | ORAL_TABLET | Freq: Two times a day (BID) | ORAL | 1 refills | Status: DC

## 2021-12-20 MED ORDER — CLOPIDOGREL BISULFATE 75 MG PO TABS: 75.0000 mg | ORAL_TABLET | Freq: Every day | ORAL | 1 refills | Status: DC

## 2021-12-20 MED ORDER — OXYCODONE HCL 5 MG PO TABS: 5.0000 mg | ORAL_TABLET | Freq: Four times a day (QID) | ORAL | 0 refills | Status: AC | PRN

## 2021-12-20 MED ORDER — FUROSEMIDE 40 MG PO TABS: 40.0000 mg | ORAL_TABLET | Freq: Every day | ORAL | 0 refills | Status: DC

## 2021-12-20 MED ORDER — LISINOPRIL 20 MG PO TABS: 20.0000 mg | ORAL_TABLET | Freq: Every day | ORAL | 1 refills | Status: DC

## 2021-12-20 MED ORDER — POTASSIUM CHLORIDE CRYS ER 20 MEQ PO TBCR: 20.0000 meq | EXTENDED_RELEASE_TABLET | Freq: Every day | ORAL | 0 refills | Status: DC

## 2021-12-20 MED ORDER — LISINOPRIL 20 MG PO TABS
20.0000 mg | ORAL_TABLET | Freq: Every day | ORAL | Status: DC
  Administered 2021-12-20: 20 mg via ORAL
  Filled 2021-12-20: qty 1

## 2021-12-20 MED ORDER — ASPIRIN 81 MG PO TBEC: 81.0000 mg | DELAYED_RELEASE_TABLET | Freq: Every day | ORAL | Status: AC

## 2021-12-20 NOTE — Progress Notes (Addendum)
Nursing dc note   CCMD  notified of dc order. Piv dcd. Site unremarkable. Patients pastor is coming to take  her home. Patient will be waiting at dc lounge , nicole nt made aware. Patient verbalized understanding of dc instructions. All belongings given to patient. Primary RN and patient both states that chest sutures will be taken out at the doctors  office ? January 19

## 2021-12-20 NOTE — Progress Notes (Signed)
[address]       [place] [address]             [phone]      7 Days Post-Op Procedure(s) (LRB): CORONARY ARTERY BYPASS GRAFTING (CABG) X  4  , ON PUMP, USING LEFT INTERNAL MAMMARY ARTERY AND RIGHT ENDOSCOPIC GREATER SAPHENOUS VEIN CONDUITS (N/A) TRANSESOPHAGEAL ECHOCARDIOGRAM (TEE) (N/A) ENDOVEIN HARVEST OF GREATER SAPHENOUS VEIN (Right) APPLICATION OF CELL SAVER Subjective: Conts to feel well  Objective: Vital signs in last 24 hours: Temp:  [97.7 F (36.5 C)-99.5 F (37.5 C)] 98.6 F (37 C) (01/13 0351) Pulse Rate:  [65-80] 65 (01/13 0351) Cardiac Rhythm: Normal sinus rhythm (01/12 1900) Resp:  [18-20] 18 (01/13 0351) BP: (142-162)/(58-77) 142/71 (01/13 0351) SpO2:  [96 %-100 %] 98 % (01/13 0351)  Hemodynamic parameters for last 24 hours:    Intake/Output from previous day: 01/12 0701 - 01/13 0700 In: 343 [P.O.:340; I.V.:3] Out: 1 [Urine:1] Intake/Output this shift: No intake/output data recorded.  General appearance: alert, cooperative, and no distress Heart: regular rate and rhythm and occas extrasystole Lungs: min dim in bases Abdomen: benign Extremities: + LE edema Wound: incis healing well  Lab Results: Recent Labs    12/18/21 0435  WBC 7.9  HGB 9.9*  HCT 30.9*  PLT 234   BMET:  Recent Labs    12/18/21 0435 12/20/21 0351  NA 140 140  K 3.8 4.3  CL 109 105  CO2 23 23  GLUCOSE 116* 121*  BUN 20 12  CREATININE 1.02* 0.98  CALCIUM 8.3* 8.8*    PT/INR: No results for input(s): LABPROT, INR in the last 72 hours. ABG    Component Value Date/Time   PHART 7.300 (L) 12/13/2021 1958   HCO3 21.7 12/13/2021 1958   TCO2 23 12/13/2021 1958   ACIDBASEDEF 5.0 (H) 12/13/2021 1958   O2SAT 98.0 12/13/2021 1958   CBG (last 3)  Recent Labs    12/19/21 1619 12/19/21 2117 12/20/21 0612  GLUCAP 140* 177* 134*    Meds Scheduled Meds:  amLODipine  10 mg Oral Daily   aspirin EC  81 mg Oral Daily   atorvastatin  80 mg  Oral Daily   bisacodyl  10 mg Oral Daily   Or   bisacodyl  10 mg Rectal Daily   clopidogrel  75 mg Oral Daily   docusate sodium  200 mg Oral Daily   empagliflozin  10 mg Oral Daily   enoxaparin (LOVENOX) injection  40 mg Subcutaneous Q24H   furosemide  40 mg Oral Daily   insulin aspart  0-24 Units Subcutaneous TID WC   insulin detemir  10 Units Subcutaneous Daily   lisinopril  10 mg Oral Daily   metoprolol tartrate  12.5 mg Oral BID   pantoprazole  40 mg Oral Daily   sodium chloride flush  3 mL Intravenous Q12H   sodium chloride flush  3 mL Intravenous Q12H   Continuous Infusions:  sodium chloride     sodium chloride     sodium chloride Stopped (12/14/21 1119)   sodium chloride     lactated ringers     lactated ringers 20 mL/hr at 12/15/21 1000   PRN Meds:.sodium chloride, sodium chloride, alum & mag hydroxide-simeth, dextrose, ondansetron (ZOFRAN) IV, oxyCODONE, sodium chloride flush, sodium chloride flush, traMADol  Xrays No results found.  Assessment/Plan: S/P Procedure(s) (LRB): CORONARY ARTERY BYPASS GRAFTING (CABG) X  4  , ON PUMP, USING LEFT INTERNAL MAMMARY ARTERY AND  RIGHT ENDOSCOPIC GREATER SAPHENOUS VEIN CONDUITS (N/A) TRANSESOPHAGEAL ECHOCARDIOGRAM (TEE) (N/A) ENDOVEIN HARVEST OF GREATER SAPHENOUS VEIN (Right) APPLICATION OF CELL SAVER  POD#7 1 afeb, VSS still hypertensive, will increase lisinopril, cont norvasc and lopressor at current dosing. Sinus in 70's, occas PVC 2 sats good on RA, som SOB with ambulation, mostly discomfort from face-mask 3 unmeasured UOP, creat stable, weight stable- will cont lasix/K+ one more week 4 BS adeq control, resume trulicity at d/c, stop insulin, cont jardiance 5 stable for d/c - has help at home. Has a walker, reviewed instructions     LOS: 10 days    John Giovanni PA-C Pager [phone] 12/20/2021

## 2021-12-20 NOTE — TOC Transition Note (Signed)
Transition of Care (TOC) - CM/SW Discharge Note Marvetta Gibbons RN, BSN Transitions of Care Unit 4E- RN Case Manager See Treatment Team for direct phone #    Patient Details  Name: Katherine Pratt MRN: [id number] Date of Birth: 06-11-1951  Transition of Care [facility]) CM/SW Contact:  Dawayne Patricia, RN Phone Number: 12/20/2021, 12:35 PM   Clinical Narrative:    Pt stable for transition home today, DME-RW has been delivered to room. No further TOC needs noted. Family to assist post discharge. Pt has transportation home.    Final next level of care: Home/Self Care Barriers to Discharge: Barriers Resolved   Patient Goals and CMS Choice Patient states their goals for this hospitalization and ongoing recovery are:: return home   Choice offered to / list presented to : NA  Discharge Placement                 Home      Discharge Plan and Services   Discharge Planning Services: CM Consult Post Acute Care Choice: Durable Medical Equipment          DME Arranged: Walker rolling DME Agency: [employer] Date DME Agency Contacted: 12/17/21 Time DME Agency Contacted: [id number] Representative spoke with at DME Agency: Freda Munro HH Arranged: NA [place] Agency: NA        Social Determinants of Health ([place]) Interventions     Readmission Risk Interventions No flowsheet data found.

## 2021-12-20 NOTE — Progress Notes (Signed)
Discussed with pt IS, sternal precautions, diet, exercise, and CRPII. Pt voiced understanding. Video system not working currently. Will refer to [facility].  [id number] Yves Dill CES, ACSM 10:33 AM 12/20/2021

## 2021-12-20 NOTE — Care Management Important Message (Signed)
Important Message  Patient Details  Name: Katherine Pratt MRN: [id number] Date of Birth: [birth year]   Medicare Important Message Given:  Yes     Shelda Altes 12/20/2021, 10:29 AM

## 2021-12-20 NOTE — Progress Notes (Signed)
SWOT RN completed discharge instructions with patient. Patient taken down to discharge lounge while waiting for ride. Gilford Rile was provided for patient upon discharge. All questions answered.  [place] N Marshal Eskew

## 2021-12-24 ENCOUNTER — Telehealth (HOSPITAL_COMMUNITY): Payer: Self-pay

## 2021-12-24 ENCOUNTER — Other Ambulatory Visit (HOSPITAL_COMMUNITY): Payer: Self-pay | Admitting: *Deleted

## 2021-12-24 DIAGNOSIS — Z951 Presence of aortocoronary bypass graft: Secondary | ICD-10-CM

## 2021-12-24 NOTE — Telephone Encounter (Signed)
Per phase I cardiac rehab, fax cardiac rehab referral to [place] [facility]. 

## 2021-12-25 ENCOUNTER — Ambulatory Visit (INDEPENDENT_AMBULATORY_CARE_PROVIDER_SITE_OTHER): Payer: Self-pay | Admitting: *Deleted

## 2021-12-25 ENCOUNTER — Other Ambulatory Visit: Payer: Self-pay

## 2021-12-25 DIAGNOSIS — Z4802 Encounter for removal of sutures: Secondary | ICD-10-CM

## 2021-12-25 NOTE — Progress Notes (Signed)
Patient arrived for nurse visit to remove sutures post-CABG 1/6 by Dr. Cyndia Bent.  Three sutures removed with no signs or symptoms of infection noted.  Incisions well approximated.  Patient tolerated suture removal well.  Patient and family instructed to keep the incision site clean and dry.  Patient and family acknowledged instructions given.  All questions answered.

## 2021-12-26 ENCOUNTER — Other Ambulatory Visit: Payer: Self-pay

## 2021-12-26 MED ORDER — FUROSEMIDE 40 MG PO TABS: 40.0000 mg | ORAL_TABLET | Freq: Every day | ORAL | 0 refills | Status: DC

## 2021-12-26 MED ORDER — POTASSIUM CHLORIDE CRYS ER 20 MEQ PO TBCR: 20.0000 meq | EXTENDED_RELEASE_TABLET | Freq: Every day | ORAL | 0 refills | Status: DC

## 2021-12-26 NOTE — Progress Notes (Signed)
Patient contacted the office stating that she is experiencing bilateral lower extremity edema. She is s/p CABG with Dr. Cyndia Bent 12/13/21. She states that she has been elevating her legs when she is seated. She does not have a pair of compression stockings to wear. Advised to wear compression stockings to help with swelling. She states that she is not short of breath, no dry cough, and the swelling is from her foot to leg and almost to thigh. She is taking her Lasix as prescribed and has one more day left.  Spoke to [place], [place] who gave patient another 15 days worth of Lasix 40 mg/ Potassium 20 meq daily until she is seen in follow-up by Cardiology. Script sent in electronically to patient's preferred pharmacy, patient aware and acknowledges receipt.

## 2022-01-03 ENCOUNTER — Other Ambulatory Visit: Payer: Self-pay | Admitting: Surgical

## 2022-01-06 ENCOUNTER — Encounter (HOSPITAL_COMMUNITY): Payer: Self-pay | Admitting: Radiology

## 2022-01-09 ENCOUNTER — Encounter: Payer: Self-pay | Admitting: Physician Assistant

## 2022-01-09 DIAGNOSIS — I700 Atherosclerosis of aorta: Secondary | ICD-10-CM | POA: Insufficient documentation

## 2022-01-09 DIAGNOSIS — I2510 Atherosclerotic heart disease of native coronary artery without angina pectoris: Secondary | ICD-10-CM

## 2022-01-09 HISTORY — DX: Atherosclerotic heart disease of native coronary artery without angina pectoris: I25.10

## 2022-01-09 HISTORY — DX: Atherosclerosis of aorta: I70.0

## 2022-01-09 NOTE — Progress Notes (Signed)
Cardiology Office Note:    Date:  01/10/2022   ID:  Jnaya Butrick, DOB [birth year], MRN [id number]  PCP:  Elisabeth Cara, PA-C  [facility] Providers Cardiologist:  Lauree Chandler, MD    Referring MD: Elisabeth Cara, *   Chief Complaint:  Hospitalization Follow-up (Non-STEMI, CABG)    Patient Profile: Coronary artery disease NSTEMI s/p CABG x4 12/2021 Morbid obesity DJD Diabetes mellitus Hypertension Hyperlipidemia Hepatic steatosis Right breast CA; s/p lumpectomy and radiation  Prior CV Studies: Pre-CABG Dopplers 12/12/2021 Bilateral ICA 1-39  Echocardiogram 12/11/2021 EF 55-60, no RWMA, mild LVH, normal RVSF, normal PASP, trivial MR  Cardiac catheterization 12/11/2021 Three-vessel CAD EF 55; subtle mid anterolateral hypocontractility  Chest CTA 12/09/2021 Aortic atherosclerosis    History of Present Illness:   Katherine Pratt is a 71 y.o. female with the above problem list.  She was admitted 1/2-1/13 with a non-STEMI.  EF was preserved by echocardiogram.  Cardiac catheterization demonstrated three-vessel CAD and she underwent CABG with Dr. Cyndia Bent (LIMA-LAD, S-OM1, S-OM2, S-PDA; endoscopic vein harvest from right leg).  Her postoperative course was unremarkable.  She returns for follow-up.   She is here alone.  Overall, she has been doing well since she was discharged from the hospital.  Chest soreness is improving.  She has not had significant shortness of breath.  She has some lower extremity edema, especially on the right.  This improves at times.  She has not had orthopnea.  She has had some burning in her legs when she lays down and has to sleep in a recliner sometimes.  She has not had syncope or near syncope.  She has not had palpitations.    Past Medical History:  Diagnosis Date   Aortic atherosclerosis ([place]) 01/09/2022   Chest CTA 12/09/2021: Aortic atherosclerosis   Breast cancer (HCC)    CAD (coronary artery disease) 01/09/2022   NSTEMI s/p CABG in  12/2021 (LIMA-LAD, S-OM1, S-OM2, S-PDA; endoscopic vein harvest from right leg) Pre-CABG Dopplers 12/12/2021: Bilateral ICA 1-39 Echocardiogram 12/11/2021: EF 55-60, no RWMA, mild LVH, normal RVSF, normal PASP, trivial MR   Diabetes mellitus without complication (HCC)    HLD (hyperlipidemia) 08/01/2015   Hypertension    Current Medications: Current Meds  Medication Sig   amLODipine (NORVASC) 10 MG tablet Take 10 mg by mouth daily.   aspirin EC 81 MG EC tablet Take 1 tablet (81 mg total) by mouth daily. Swallow whole.   empagliflozin (JARDIANCE) 10 MG TABS tablet Take 1 tablet (10 mg total) by mouth daily.   furosemide (LASIX) 40 MG tablet Take 1 tablet (40 mg total) by mouth daily.   lisinopril (ZESTRIL) 20 MG tablet Take 1 tablet (20 mg total) by mouth daily.   polyvinyl alcohol (LIQUIFILM TEARS) 1.4 % ophthalmic solution Place 1 drop into both eyes as needed for dry eyes.   TRULICITY 3 JO/8.4ZY SOPN Inject 3 mg into the skin once a week.   [DISCONTINUED] atorvastatin (LIPITOR) 80 MG tablet Take 1 tablet (80 mg total) by mouth daily.   [DISCONTINUED] clopidogrel (PLAVIX) 75 MG tablet Take 1 tablet (75 mg total) by mouth daily.   [DISCONTINUED] metoprolol tartrate (LOPRESSOR) 25 MG tablet Take 0.5 tablets (12.5 mg total) by mouth 2 (two) times daily.   [DISCONTINUED] potassium chloride SA (KLOR-CON M) 20 MEQ tablet Take 1 tablet (20 mEq total) by mouth daily.    Allergies:   Aspirin, Keflex [cephalexin], and Losartan   Social History   Tobacco Use   Smoking status:  Never   Smokeless tobacco: Never  Vaping Use   Vaping Use: Never used  Substance Use Topics   Alcohol use: No   Drug use: No    Family Hx: The patient's family history is not on file.  Review of Systems  Constitutional: Negative for decreased appetite.  Respiratory:  Negative for cough.   Gastrointestinal:  Negative for constipation.    EKGs/Labs/Other Test Reviewed:    EKG:  EKG is  ordered today.  The ekg ordered  today demonstrates NSR, HR 80, normal axis, first-degree block, PR 296, PVCs, QTC 440, inferior and anterolateral T wave inversions  Recent Labs: 12/09/2021: B Natriuretic Peptide 123.5 12/10/2021: TSH 1.489 12/11/2021: ALT 39 12/14/2021: Magnesium 2.9 12/18/2021: Hemoglobin 9.9; Platelets 234 12/20/2021: BUN 12; Creatinine, Ser 0.98; Potassium 4.3; Sodium 140   Recent Lipid Panel Recent Labs    12/10/21 1407  CHOL 154  TRIG 96  HDL 32*  VLDL 19  LDLCALC 103*     Risk Assessment/Calculations:         Physical Exam:    VS:  BP 136/60 (BP Location: Left Arm, Patient Position: Sitting, Cuff Size: Large)    Pulse 80    Ht 5' 5"  (1.651 m)    Wt 237 lb (107.5 kg)    SpO2 97%    BMI 39.44 kg/m     Wt Readings from Last 3 Encounters:  01/10/22 237 lb (107.5 kg)  12/19/21 246 lb 14.6 oz (112 kg)  07/18/21 249 lb (112.9 kg)    Constitutional:      Appearance: Healthy appearance. Not in distress.  Neck:     Vascular: No JVR. JVD normal.  Pulmonary:     Effort: Pulmonary effort is normal.     Breath sounds: No wheezing. No rales.  Cardiovascular:     Normal rate. Regular rhythm. Normal S1. Normal S2.      Murmurs: There is a grade 1/6 systolic murmur at the URSB.  Edema:    Peripheral edema present.    Ankle: bilateral trace edema of the ankle. Abdominal:     Palpations: Abdomen is soft.  Skin:    General: Skin is warm and dry.  Neurological:     Mental Status: Alert and oriented to person, place and time.     Cranial Nerves: Cranial nerves are intact.        ASSESSMENT & PLAN:   NSTEMI (non-ST elevated myocardial infarction) ([place]) She is progressing well after recent non-STEMI followed by CABG.  She sees Dr. Cyndia Bent soon.  I have encouraged her to pursue cardiac rehab.  She does live in [facility] and may want to go to their rehab facility.  We discussed the importance of continuing dual antiplatelet therapy for 1 year post MI.  Continue aspirin 81 mg daily, atorvastatin 80 mg  daily, clopidogrel 75 mg daily, lisinopril 20 mg daily, metoprolol tartrate 25 mg twice daily.  She does have inferior and inferolateral T wave inversions.  I suspect that this is related to her MI and reperfusion.  Arrange follow-up EKG at next office visit in 6 to 8 weeks.  First degree AV block She seems to be tolerating beta-blocker therapy well.  She inadvertently has been taking 25 mg of metoprolol tartrate instead of 12.5 mg.  Continue metoprolol tartrate 25 mg twice daily.  PVC's (premature ventricular contractions) She is asymptomatic.  She remains on furosemide and potassium.  Obtain BMET, magnesium today.  If potassium and magnesium are normal, consider 3-day  ZIO to quantify PVCs.  Aortic atherosclerosis (HCC) Continue aspirin, statin.  HLD (hyperlipidemia) Continue atorvastatin 80 mg daily.  Arrange fasting CMET, lipids in 6 to 8 weeks.  Benign essential hypertension Blood pressure is well controlled.  Continue amlodipine 10 mg daily, furosemide 40 mg daily, lisinopril 20 mg daily, metoprolol tartrate 25 mg twice daily.           Dispo:  Return in about 8 weeks (around 03/07/2022) for Routine follow up in 8 weeks with Dr. Angelena Form. .   Medication Adjustments/Labs and Tests Ordered: Current medicines are reviewed at length with the patient today.  Concerns regarding medicines are outlined above.  Tests Ordered: Orders Placed This Encounter  Procedures   Basic Metabolic Panel (BMET)   Magnesium   Comp Met (CMET)   Lipid Profile   EKG 12-Lead   Medication Changes: Meds ordered this encounter  Medications   metoprolol tartrate (LOPRESSOR) 25 MG tablet    Sig: Take 1 tablet (25 mg total) by mouth 2 (two) times daily.    Dispense:  180 tablet    Refill:  3   potassium chloride SA (KLOR-CON M) 20 MEQ tablet    Sig: Take 1 tablet (20 mEq total) by mouth daily.    Dispense:  90 tablet    Refill:  3   clopidogrel (PLAVIX) 75 MG tablet    Sig: Take 1 tablet (75 mg total)  by mouth daily.    Dispense:  90 tablet    Refill:  3   atorvastatin (LIPITOR) 80 MG tablet    Sig: Take 1 tablet (80 mg total) by mouth daily.    Dispense:  90 tablet    Refill:  3   Signed, Richardson Dopp, PA-C  01/10/2022 9:23 AM    [facility] [address], [place], [place]  [address] Phone: [phone]; Fax: [phone]

## 2022-01-10 ENCOUNTER — Encounter: Payer: Self-pay | Admitting: Physician Assistant

## 2022-01-10 ENCOUNTER — Other Ambulatory Visit: Payer: Self-pay

## 2022-01-10 ENCOUNTER — Ambulatory Visit: Payer: Medicare HMO | Admitting: Physician Assistant

## 2022-01-10 VITALS — BP 136/60 | HR 80 | Ht 65.0 in | Wt 237.0 lb

## 2022-01-10 DIAGNOSIS — I700 Atherosclerosis of aorta: Secondary | ICD-10-CM | POA: Diagnosis not present

## 2022-01-10 DIAGNOSIS — E7800 Pure hypercholesterolemia, unspecified: Secondary | ICD-10-CM

## 2022-01-10 DIAGNOSIS — I214 Non-ST elevation (NSTEMI) myocardial infarction: Secondary | ICD-10-CM | POA: Diagnosis not present

## 2022-01-10 DIAGNOSIS — I440 Atrioventricular block, first degree: Secondary | ICD-10-CM | POA: Insufficient documentation

## 2022-01-10 DIAGNOSIS — I493 Ventricular premature depolarization: Secondary | ICD-10-CM | POA: Insufficient documentation

## 2022-01-10 DIAGNOSIS — I10 Essential (primary) hypertension: Secondary | ICD-10-CM

## 2022-01-10 LAB — BASIC METABOLIC PANEL
BUN/Creatinine Ratio: 22 (ref 12–28)
BUN: 20 mg/dL (ref 8–27)
CO2: 25 mmol/L (ref 20–29)
Calcium: 9.5 mg/dL (ref 8.7–10.3)
Chloride: 105 mmol/L (ref 96–106)
Creatinine, Ser: 0.92 mg/dL (ref 0.57–1.00)
Glucose: 115 mg/dL — ABNORMAL HIGH (ref 70–99)
Potassium: 4.6 mmol/L (ref 3.5–5.2)
Sodium: 142 mmol/L (ref 134–144)
eGFR: 67 mL/min/{1.73_m2} (ref >59)

## 2022-01-10 LAB — MAGNESIUM: Magnesium: 2.5 mg/dL — ABNORMAL HIGH (ref 1.6–2.3)

## 2022-01-10 MED ORDER — CLOPIDOGREL BISULFATE 75 MG PO TABS: 75.0000 mg | ORAL_TABLET | Freq: Every day | ORAL | 3 refills | Status: DC

## 2022-01-10 MED ORDER — METOPROLOL TARTRATE 25 MG PO TABS: 25.0000 mg | ORAL_TABLET | Freq: Two times a day (BID) | ORAL | 3 refills | Status: DC

## 2022-01-10 MED ORDER — POTASSIUM CHLORIDE CRYS ER 20 MEQ PO TBCR: 20.0000 meq | EXTENDED_RELEASE_TABLET | Freq: Every day | ORAL | 3 refills | Status: DC

## 2022-01-10 MED ORDER — ATORVASTATIN CALCIUM 80 MG PO TABS: 80.0000 mg | ORAL_TABLET | Freq: Every day | ORAL | 3 refills | Status: DC

## 2022-01-10 NOTE — Assessment & Plan Note (Addendum)
She is progressing well after recent non-STEMI followed by CABG.  She sees Dr. Cyndia Bent soon.  I have encouraged her to pursue cardiac rehab.  She does live in [facility] and may want to go to their rehab facility.  We discussed the importance of continuing dual antiplatelet therapy for 1 year post MI.  Continue aspirin 81 mg daily, atorvastatin 80 mg daily, clopidogrel 75 mg daily, lisinopril 20 mg daily, metoprolol tartrate 25 mg twice daily.  She does have inferior and inferolateral T wave inversions.  I suspect that this is related to her MI and reperfusion.  Arrange follow-up EKG at next office visit in 6 to 8 weeks.

## 2022-01-10 NOTE — Assessment & Plan Note (Signed)
She seems to be tolerating beta-blocker therapy well.  She inadvertently has been taking 25 mg of metoprolol tartrate instead of 12.5 mg.  Continue metoprolol tartrate 25 mg twice daily.

## 2022-01-10 NOTE — Assessment & Plan Note (Signed)
She is asymptomatic.  She remains on furosemide and potassium.  Obtain BMET, magnesium today.  If potassium and magnesium are normal, consider 3-day ZIO to quantify PVCs.

## 2022-01-10 NOTE — Patient Instructions (Signed)
Medication Instructions:   Your physician recommends that you continue on your current medications as directed. Please refer to the Current Medication list given to you today.  *If you need a refill on your cardiac medications before your next appointment, please call your pharmacy*   Lab Work:  TODAY!!!!!!  BMET/MAG  Your physician recommends that you return for a FASTING lipid profile/CMET. Same days as appointment. March 27. You can come in on the day of your appointment anytime between 7:30-4:30 fasting from midnight the night before.    If you have labs (blood work) drawn today and your tests are completely normal, you will receive your results only by: Elsa (if you have MyChart) OR A paper copy in the mail If you have any lab test that is abnormal or we need to change your treatment, we will call you to review the results.   Testing/Procedures:  None ordered.   Follow-Up: At [facility], you and your health needs are our priority.  As part of our continuing mission to provide you with exceptional heart care, we have created designated Provider Care Teams.  These Care Teams include your primary Cardiologist (physician) and Advanced Practice Providers (APPs -  Physician Assistants and Nurse Practitioners) who all work together to provide you with the care you need, when you need it.  We recommend signing up for the patient portal called "MyChart".  Sign up information is provided on this After Visit Summary.  MyChart is used to connect with patients for Virtual Visits (Telemedicine).  Patients are able to view lab/test results, encounter notes, upcoming appointments, etc.  Non-urgent messages can be sent to your provider as well.   To learn more about what you can do with MyChart, go to [email or web address].    Your next appointment:   8 week(s)  The format for your next appointment:   In Person  Provider:   Lauree Chandler, MD

## 2022-01-10 NOTE — Assessment & Plan Note (Signed)
Blood pressure is well controlled.  Continue amlodipine 10 mg daily, furosemide 40 mg daily, lisinopril 20 mg daily, metoprolol tartrate 25 mg twice daily.

## 2022-01-10 NOTE — Assessment & Plan Note (Signed)
Continue atorvastatin 80 mg daily.  Arrange fasting CMET, lipids in 6 to 8 weeks.

## 2022-01-10 NOTE — Assessment & Plan Note (Signed)
Continue aspirin, statin.  

## 2022-01-11 ENCOUNTER — Other Ambulatory Visit: Payer: Self-pay | Admitting: Surgical

## 2022-01-12 ENCOUNTER — Other Ambulatory Visit: Payer: Self-pay | Admitting: Surgical

## 2022-01-13 ENCOUNTER — Ambulatory Visit: Payer: Medicare HMO

## 2022-01-13 ENCOUNTER — Other Ambulatory Visit: Payer: Self-pay | Admitting: *Deleted

## 2022-01-13 ENCOUNTER — Other Ambulatory Visit: Payer: Self-pay | Admitting: Surgical

## 2022-01-13 DIAGNOSIS — I493 Ventricular premature depolarization: Secondary | ICD-10-CM

## 2022-01-13 NOTE — Progress Notes (Unsigned)
Enrolled patient for a 3 day Zio XT monitor to be mailed to patients home  

## 2022-01-14 ENCOUNTER — Other Ambulatory Visit: Payer: Self-pay | Admitting: Surgery

## 2022-01-14 DIAGNOSIS — Z951 Presence of aortocoronary bypass graft: Secondary | ICD-10-CM

## 2022-01-14 LAB — ECHO INTRAOPERATIVE TEE
AR max vel: 2.06 cm2
AV Area VTI: 1.59 cm2
AV Area mean vel: 1.94 cm2
AV Mean grad: 6 mmHg
AV Peak grad: 11.3 mmHg
Ao pk vel: 1.68 m/s
Height: 65 in
MV VTI: 2.93 cm2
Weight: 3873.04 oz

## 2022-01-15 ENCOUNTER — Encounter: Payer: Self-pay | Admitting: Surgery

## 2022-01-15 ENCOUNTER — Other Ambulatory Visit (INDEPENDENT_AMBULATORY_CARE_PROVIDER_SITE_OTHER): Payer: Medicare HMO

## 2022-01-15 ENCOUNTER — Ambulatory Visit (INDEPENDENT_AMBULATORY_CARE_PROVIDER_SITE_OTHER): Payer: Self-pay | Admitting: Surgery

## 2022-01-15 ENCOUNTER — Other Ambulatory Visit: Payer: Self-pay | Admitting: *Deleted

## 2022-01-15 ENCOUNTER — Ambulatory Visit
Admission: RE | Admit: 2022-01-15 | Discharge: 2022-01-15 | Disposition: A | Discharge status: Home / Self Care | Payer: Medicare HMO | Source: Ambulatory Visit | Attending: Surgery | Admitting: Surgery

## 2022-01-15 ENCOUNTER — Other Ambulatory Visit: Payer: Self-pay

## 2022-01-15 VITALS — BP 140/82 | HR 76 | Resp 20 | Ht 65.0 in | Wt 237.0 lb

## 2022-01-15 DIAGNOSIS — Z951 Presence of aortocoronary bypass graft: Secondary | ICD-10-CM

## 2022-01-15 DIAGNOSIS — I493 Ventricular premature depolarization: Secondary | ICD-10-CM

## 2022-01-15 NOTE — Progress Notes (Unsigned)
3 day ZIO XT Serial # [id number] from office inventory applied .

## 2022-01-15 NOTE — Progress Notes (Signed)
° °  HPI: Patient returns for routine postoperative follow-up having undergone CABG x 4 on 12/13/2021. The patient's early postoperative recovery while in the hospital was notable for an uncomplicated postop course. Since hospital discharge the patient reports that she has been feeling well. She is walking daily without chest pain or shortness of breath.   Current Outpatient Medications  Medication Sig Dispense Refill   amLODipine (NORVASC) 10 MG tablet Take 10 mg by mouth daily.     aspirin EC 81 MG EC tablet Take 1 tablet (81 mg total) by mouth daily. Swallow whole.     atorvastatin (LIPITOR) 80 MG tablet Take 1 tablet (80 mg total) by mouth daily. 90 tablet 3   clopidogrel (PLAVIX) 75 MG tablet Take 1 tablet (75 mg total) by mouth daily. 90 tablet 3   empagliflozin (JARDIANCE) 10 MG TABS tablet Take 1 tablet (10 mg total) by mouth daily. 30 tablet 1   furosemide (LASIX) 40 MG tablet Take 1 tablet (40 mg total) by mouth daily. 15 tablet 0   lisinopril (ZESTRIL) 20 MG tablet Take 1 tablet (20 mg total) by mouth daily. 30 tablet 1   metoprolol tartrate (LOPRESSOR) 25 MG tablet Take 1 tablet (25 mg total) by mouth 2 (two) times daily. 180 tablet 3   polyvinyl alcohol (LIQUIFILM TEARS) 1.4 % ophthalmic solution Place 1 drop into both eyes as needed for dry eyes.     potassium chloride SA (KLOR-CON M) 20 MEQ tablet Take 1 tablet (20 mEq total) by mouth daily. 90 tablet 3   TRULICITY 3 YJ/8.5UD SOPN Inject 3 mg into the skin once a week.     No current facility-administered medications for this visit.    Physical Exam: BP 140/82    Pulse 76    Resp 20    Ht 5\' 5"  (1.651 m)    Wt 237 lb (107.5 kg)    SpO2 98% Comment: RA   BMI 39.44 kg/m  She looks well Cardiac exam shows a regular rate and rhythm with normal heart sounds Lungs are clear The incisions are healing well. The sternum is stable.  Diagnostic Tests:  Narrative & Impression  CLINICAL DATA:  Status post CABG four weeks ago.    EXAM: CHEST - 2 VIEW   COMPARISON:  12/15/2021   FINDINGS: Improved inspiration. Borderline enlarged cardiac silhouette without significant change. Improved aeration at the left lung base with a small amount of residual linear density. Improved aeration at the right lung base with a possible minimal right pleural effusion. Thoracic spine degenerative changes. Post CABG changes.   IMPRESSION: 1. Improved aeration at the lung bases with a small amount of residual linear atelectasis or scarring at the left lung base. 2. Possible small right pleural effusion.     Electronically Signed   By: Claudie Revering M.D.   On: 01/15/2022 10:15    Impression:  She is making a good recovery following CABG. I encouraged her to continue walking as much as possible. She is planning to do cardiac rehab and that could start any time. I told her that she can return to driving but should refrain from any heaving lifting for 3 months postop to let her sternum heal.  Plan:  She will continue to follow up with her PCP and cardiology and will return to see me if the need arises.   Gaye Pollack, MD [facility] [phone]

## 2022-01-31 ENCOUNTER — Telehealth: Payer: Self-pay | Admitting: Cardiovascular Disease

## 2022-01-31 NOTE — Telephone Encounter (Signed)
Patient was returning call to discuss Monitor results

## 2022-01-31 NOTE — Telephone Encounter (Signed)
Spoke with the patient and made her aware to increase metoprolol to 25 mg BID. Patient verbalized understanding.

## 2022-01-31 NOTE — Telephone Encounter (Signed)
Increase Metoprolol tartrate to 25 mg twice daily. Richardson Dopp, PA-C    01/31/2022 1:02 PM

## 2022-01-31 NOTE — Telephone Encounter (Signed)
Liliane Shi, PA-C  01/29/2022  5:33 PM EST     Monitor shows frequent PVCs.   PLAN:  -Increase Metoprolol tartrate to 37.5 mg twice daily. -RN visit for EKG in 2 weeks Richardson Dopp, PA-C    01/29/2022 5:32 PM     Spoke with the patient who states that she has only been taking metoprolol 25 mg once per day. She has been doing this ever since her visit with Nicki Reaper on 2/3. Will send message to Nicki Reaper to see if he still wants her to increase to 37.5 BID since she was not taking 25 BID while wearing the monitor.

## 2022-02-16 ENCOUNTER — Other Ambulatory Visit: Payer: Self-pay | Admitting: Surgical

## 2022-02-19 ENCOUNTER — Other Ambulatory Visit: Payer: Self-pay

## 2022-02-19 ENCOUNTER — Telehealth: Payer: Self-pay | Admitting: Cardiovascular Disease

## 2022-02-19 MED ORDER — EMPAGLIFLOZIN 10 MG PO TABS: 10.0000 mg | ORAL_TABLET | Freq: Every day | ORAL | 5 refills | Status: DC

## 2022-02-19 NOTE — Telephone Encounter (Signed)
? ?*  STAT* If patient is at the pharmacy, call can be transferred to refill team. ? ? ?1. Which medications need to be refilled? (please list name of each medication and dose if known) empagliflozin (JARDIANCE) 10 MG TABS tablet ? ?2. Which pharmacy/location (including street and city if local pharmacy) is medication to be sent to? [id number] [place] [place] - [address]? ?3. Do they need a 30 day or 90 day supply? 30 days ?

## 2022-02-21 ENCOUNTER — Telehealth: Payer: Self-pay | Admitting: Cardiovascular Disease

## 2022-02-21 MED ORDER — LISINOPRIL 20 MG PO TABS: 20.0000 mg | ORAL_TABLET | Freq: Every day | ORAL | 3 refills | Status: DC

## 2022-02-21 NOTE — Telephone Encounter (Signed)
?*  STAT* If patient is at the pharmacy, call can be transferred to refill team. ? ? ?1. Which medications need to be refilled? (please list name of each medication and dose if known) lisinopril (ZESTRIL) 20 MG tablet ? ?2. Which pharmacy/location (including street and city if local pharmacy) is medication to be sent to? [id number] [place], [place] [address]? ?3. Do they need a 30 day or 90 day supply? 90  ?

## 2022-02-21 NOTE — Telephone Encounter (Signed)
Pt's medication was sent to pt's pharmacy as requested. Confirmation received.  °

## 2022-03-03 ENCOUNTER — Other Ambulatory Visit: Payer: Medicare HMO | Admitting: *Deleted

## 2022-03-03 ENCOUNTER — Ambulatory Visit: Payer: Medicare HMO | Admitting: Cardiovascular Disease

## 2022-03-03 ENCOUNTER — Encounter: Payer: Self-pay | Admitting: Cardiovascular Disease

## 2022-03-03 ENCOUNTER — Other Ambulatory Visit: Payer: Self-pay

## 2022-03-03 VITALS — BP 126/70 | HR 62 | Ht 65.0 in | Wt 234.2 lb

## 2022-03-03 DIAGNOSIS — E7800 Pure hypercholesterolemia, unspecified: Secondary | ICD-10-CM

## 2022-03-03 DIAGNOSIS — I10 Essential (primary) hypertension: Secondary | ICD-10-CM

## 2022-03-03 DIAGNOSIS — I2510 Atherosclerotic heart disease of native coronary artery without angina pectoris: Secondary | ICD-10-CM

## 2022-03-03 DIAGNOSIS — I493 Ventricular premature depolarization: Secondary | ICD-10-CM | POA: Diagnosis not present

## 2022-03-03 DIAGNOSIS — I700 Atherosclerosis of aorta: Secondary | ICD-10-CM

## 2022-03-03 DIAGNOSIS — I214 Non-ST elevation (NSTEMI) myocardial infarction: Secondary | ICD-10-CM

## 2022-03-03 DIAGNOSIS — I440 Atrioventricular block, first degree: Secondary | ICD-10-CM

## 2022-03-03 LAB — LIPID PANEL
Chol/HDL Ratio: 3.4 ratio (ref 0.0–4.4)
Cholesterol, Total: 128 mg/dL (ref 100–199)
HDL: 38 mg/dL — ABNORMAL LOW (ref >39)
LDL Chol Calc (NIH): 75 mg/dL (ref 0–99)
Triglycerides: 71 mg/dL (ref 0–149)
VLDL Cholesterol Cal: 15 mg/dL (ref 5–40)

## 2022-03-03 LAB — BASIC METABOLIC PANEL
BUN/Creatinine Ratio: 18 (ref 12–28)
BUN: 16 mg/dL (ref 8–27)
CO2: 25 mmol/L (ref 20–29)
Calcium: 9.2 mg/dL (ref 8.7–10.3)
Chloride: 107 mmol/L — ABNORMAL HIGH (ref 96–106)
Creatinine, Ser: 0.9 mg/dL (ref 0.57–1.00)
Glucose: 131 mg/dL — ABNORMAL HIGH (ref 70–99)
Potassium: 4.2 mmol/L (ref 3.5–5.2)
Sodium: 142 mmol/L (ref 134–144)
eGFR: 68 mL/min/{1.73_m2} (ref >59)

## 2022-03-03 LAB — HEPATIC FUNCTION PANEL
ALT: 21 IU/L (ref 0–32)
AST: 21 IU/L (ref 0–40)
Albumin: 4.1 g/dL (ref 3.7–4.7)
Alkaline Phosphatase: 145 IU/L — ABNORMAL HIGH (ref 44–121)
Bilirubin Total: 0.4 mg/dL (ref 0.0–1.2)
Bilirubin, Direct: 0.11 mg/dL (ref 0.00–0.40)
Total Protein: 6.9 g/dL (ref 6.0–8.5)

## 2022-03-03 LAB — MAGNESIUM: Magnesium: 2.3 mg/dL (ref 1.6–2.3)

## 2022-03-03 NOTE — Progress Notes (Signed)
? ?Chief Complaint  ?Patient presents with  ? Follow-up  ?  CAD  ? ?History of Present Illness: 71 yo female with history of CAD s/p CABG, obesity, diabetes, HTN, hyperlipidemia and breast cancer here today for cardiac follow up. She was admitted to [facility] in January 2023 with a NSTEMI. Cardiac cath with severe three vessel CAD. She underwent 4V CABG in January 2023. Echo 12/11/21 with LVEF=55-60%, no significant valve disease. She was doing well when seen in our office February 2023. Cardiac monitor February 2023 with PVCs. Metoprolol dose increased.  ? ?She is here today for follow up. The patient denies any chest pain, dyspnea, palpitations, lower extremity edema, orthopnea, PND, dizziness, near syncope or syncope.  ? ?Primary Care Physician: Belva Bertin, [place], PA-C ? ? ?Past Medical History:  ?Diagnosis Date  ? Aortic atherosclerosis ([place]) 01/09/2022  ? Chest CTA 12/09/2021: Aortic atherosclerosis  ? Breast cancer ([place])   ? CAD (coronary artery disease) 01/09/2022  ? NSTEMI s/p CABG in 12/2021 (LIMA-LAD, S-OM1, S-OM2, S-PDA; endoscopic vein harvest from right leg) Pre-CABG Dopplers 12/12/2021: Bilateral ICA 1-39 Echocardiogram 12/11/2021: EF 55-60, no RWMA, mild LVH, normal RVSF, normal PASP, trivial MR  ? Diabetes mellitus without complication ([place])   ? HLD (hyperlipidemia) 08/01/2015  ? Hypertension   ? ? ?Past Surgical History:  ?Procedure Laterality Date  ? ABDOMINAL HYSTERECTOMY    ? APPENDECTOMY    ? BREAST LUMPECTOMY    ? CORONARY ARTERY BYPASS GRAFT N/A 12/13/2021  ? Procedure: CORONARY ARTERY BYPASS GRAFTING (CABG) X  4  , ON PUMP, USING LEFT INTERNAL MAMMARY ARTERY AND RIGHT ENDOSCOPIC GREATER SAPHENOUS VEIN CONDUITS;  Surgeon: Gaye Pollack, MD;  Location: [place];  Service: Open Heart Surgery;  Laterality: N/A;  ? ELBOW SURGERY    ? ENDOVEIN HARVEST OF GREATER SAPHENOUS VEIN Right 12/13/2021  ? Procedure: ENDOVEIN HARVEST OF GREATER SAPHENOUS VEIN;  Surgeon: Gaye Pollack, MD;  Location: [place];  Service: Open  Heart Surgery;  Laterality: Right;  ? HERNIA REPAIR    ? KNEE SURGERY    ? LEFT HEART CATH AND CORONARY ANGIOGRAPHY N/A 12/11/2021  ? Procedure: LEFT HEART CATH AND CORONARY ANGIOGRAPHY;  Surgeon: Troy Sine, MD;  Location: [place] [facility];  Service: Cardiovascular;  Laterality: N/A;  ? TEE WITHOUT CARDIOVERSION N/A 12/13/2021  ? Procedure: TRANSESOPHAGEAL ECHOCARDIOGRAM (TEE);  Surgeon: Gaye Pollack, MD;  Location: [place];  Service: Open Heart Surgery;  Laterality: N/A;  ? ? ?Current Outpatient Medications  ?Medication Sig Dispense Refill  ? amLODipine (NORVASC) 10 MG tablet Take 10 mg by mouth daily.    ? aspirin EC 81 MG EC tablet Take 1 tablet (81 mg total) by mouth daily. Swallow whole.    ? atorvastatin (LIPITOR) 80 MG tablet Take 1 tablet (80 mg total) by mouth daily. 90 tablet 3  ? clopidogrel (PLAVIX) 75 MG tablet Take 1 tablet (75 mg total) by mouth daily. 90 tablet 3  ? empagliflozin (JARDIANCE) 10 MG TABS tablet Take 1 tablet (10 mg total) by mouth daily. 30 tablet 5  ? lisinopril (ZESTRIL) 20 MG tablet Take 1 tablet (20 mg total) by mouth daily. 90 tablet 3  ? metoprolol tartrate (LOPRESSOR) 25 MG tablet Take 1 tablet (25 mg total) by mouth 2 (two) times daily. 180 tablet 3  ? polyvinyl alcohol (LIQUIFILM TEARS) 1.4 % ophthalmic solution Place 1 drop into both eyes as needed for dry eyes.    ? potassium chloride SA (KLOR-CON M)  20 MEQ tablet Take 1 tablet (20 mEq total) by mouth daily. 90 tablet 3  ? TRULICITY 3 ZW/2.5EN SOPN Inject 3 mg into the skin once a week.    ? ?No current facility-administered medications for this visit.  ? ? ?Allergies  ?Allergen Reactions  ? Aspirin Other (See Comments)  ?  On meloxicam  ? Keflex [Cephalexin] Hives  ? Losartan Rash  ? ? ?Social History  ? ?Socioeconomic History  ? Marital status: Married  ?  Spouse name: Not on file  ? Number of children: Not on file  ? Years of education: Not on file  ? Highest education level: Not on file  ?Occupational History  ? Not  on file  ?Tobacco Use  ? Smoking status: Never  ? Smokeless tobacco: Never  ?Vaping Use  ? Vaping Use: Never used  ?Substance and Sexual Activity  ? Alcohol use: No  ? Drug use: No  ? Sexual activity: Not on file  ?Other Topics Concern  ? Not on file  ?Social History Narrative  ? Not on file  ? ?Social Determinants of Health  ? ?Financial Resource Strain: Not on file  ?Food Insecurity: Not on file  ?Transportation Needs: Not on file  ?Physical Activity: Not on file  ?Stress: Not on file  ?Social Connections: Not on file  ?Intimate Partner Violence: Not on file  ? ? ?History reviewed. No pertinent family history. ? ?Review of Systems:  As stated in the HPI and otherwise negative.  ? ?BP 126/70   Pulse 62   Ht '5\' 5"'$  (1.651 m)   Wt 234 lb 3.2 oz (106.2 kg)   SpO2 100%   BMI 38.97 kg/m?  ? ?Physical Examination: ?General: Well developed, well nourished, NAD  ?HEENT: OP clear, mucus membranes moist  ?SKIN: warm, dry. No rashes. ?Neuro: No focal deficits  ?Musculoskeletal: Muscle strength 5/5 all ext  ?Psychiatric: Mood and affect normal  ?Neck: No JVD, no carotid bruits, no thyromegaly, no lymphadenopathy.  ?Lungs:Clear bilaterally, no wheezes, rhonci, crackles ?Cardiovascular: Regular rate and rhythm. No murmurs, gallops or rubs. ?Abdomen:Soft. Bowel sounds present. Non-tender.  ?Extremities: No lower extremity edema. Pulses are 2 + in the bilateral DP/PT. ? ?EKG:  EKG is not ordered today. ?The ekg ordered today demonstrates  ? ?Echo 12/11/20: ? 1. Aortic valve appears calcified with indeterminant number of cusps; no  ?AS by doppler.  ? 2. Left ventricular ejection fraction, by estimation, is 55 to 60%. The  ?left ventricle has normal function. The left ventricle has no regional  ?wall motion abnormalities. There is mild left ventricular hypertrophy.  ?Left ventricular diastolic parameters  ?are indeterminate.  ? 3. Right ventricular systolic function is normal. The right ventricular  ?size is normal. There is  normal pulmonary artery systolic pressure.  ? 4. The mitral valve is normal in structure. Trivial mitral valve  ?regurgitation. No evidence of mitral stenosis.  ? 5. The aortic valve is normal in structure. Aortic valve regurgitation is  ?not visualized. No aortic stenosis is present.  ? 6. The inferior vena cava is dilated in size with >50% respiratory  ?variability, suggesting right atrial pressure of 8 mmHg.  ? ?Recent Labs: ?12/09/2021: B Natriuretic Peptide 123.5 ?12/10/2021: TSH 1.489 ?12/11/2021: ALT 39 ?12/18/2021: Hemoglobin 9.9; Platelets 234 ?01/10/2022: BUN 20; Creatinine, Ser 0.92; Magnesium 2.5; Potassium 4.6; Sodium 142  ? ?Lipid Panel ?   ?Component Value Date/Time  ? CHOL 154 12/10/2021 1407  ? TRIG 96 12/10/2021 1407  ? HDL  32 (L) 12/10/2021 1407  ? CHOLHDL 4.8 12/10/2021 1407  ? VLDL 19 12/10/2021 1407  ? LDLCALC 103 (H) 12/10/2021 1407  ? ?  ?Wt Readings from Last 3 Encounters:  ?03/03/22 234 lb 3.2 oz (106.2 kg)  ?01/15/22 237 lb (107.5 kg)  ?01/10/22 237 lb (107.5 kg)  ?  ?Assessment and Plan:  ? ?1. CAD s/p CABG without angina: No chest pain. Continue ASA, Plavix, statin and beta blocker.  ? ?2. HTN: BP controlled. No changes. BMET today. Will check Mg level as this was elevated on last check.  ? ?3. Hyperlipidemia: LDL 103 while hospitalized. Repeat lipids and LFTs now. Continue statin.  ? ?4. PVCs: No palpitations. Will continue beta blocker ? ?5. Aortic atherosclerosis: continue statin ? ?Labs/ tests ordered today include:  ? ?Orders Placed This Encounter  ?Procedures  ? Hepatic function panel  ? Lipid panel  ? Basic metabolic panel  ? Magnesium  ? Magnesium  ? Lipid panel  ? Hepatic function panel  ? Basic metabolic panel  ? ? ? ?Disposition:   F/U with me in 6 months.  ? ? ?Signed, ?Lauree Chandler, MD ?03/03/2022 10:23 AM    ?[address], [place], [place]  [id number] ?Phone: [phone]; Fax: [phone]  ? ? ?

## 2022-03-03 NOTE — Patient Instructions (Addendum)
Medication Instructions:  ?Your physician recommends that you continue on your current medications as directed. Please refer to the Current Medication list given to you today. ? ?*If you need a refill on your cardiac medications before your next appointment, please call your pharmacy* ? ? ?Lab Work: ?Lipids, Magnesium, Lft, Bmet- Today  ? ?If you have labs (blood work) drawn today and your tests are completely normal, you will receive your results only by: ?MyChart Message (if you have MyChart) OR ?A paper copy in the mail ?If you have any lab test that is abnormal or we need to change your treatment, we will call you to review the results. ? ? ?Testing/Procedures: ?None ordered  ? ? ?Follow-Up: ?At [facility], you and your health needs are our priority.  As part of our continuing mission to provide you with exceptional heart care, we have created designated Provider Care Teams.  These Care Teams include your primary Cardiologist (physician) and Advanced Practice Providers (APPs -  Physician Assistants and Nurse Practitioners) who all work together to provide you with the care you need, when you need it. ? ?We recommend signing up for the patient portal called "MyChart".  Sign up information is provided on this After Visit Summary.  MyChart is used to connect with patients for Virtual Visits (Telemedicine).  Patients are able to view lab/test results, encounter notes, upcoming appointments, etc.  Non-urgent messages can be sent to your provider as well.   ?To learn more about what you can do with MyChart, go to [email or web address].   ? ?Your next appointment:   ?12 month(s) ? ?The format for your next appointment:   ?In Person ? ?Provider:   ?Lauree Chandler, MD   ? ? ?Other Instructions ?None   ?

## 2022-03-04 ENCOUNTER — Telehealth: Payer: Self-pay | Admitting: Cardiovascular Disease

## 2022-03-04 NOTE — Telephone Encounter (Signed)
Spoke with patient about recent lab results. ? ?Per Dr. Angelena Form: ? ?Kidney function is ok. Magnesium level is normal. LFTS normal. LDL near goal. No changes. ? ?Patient verbalized understanding. ?

## 2022-03-04 NOTE — Telephone Encounter (Signed)
Patient returning call for lab results. 

## 2022-06-09 ENCOUNTER — Other Ambulatory Visit: Payer: Self-pay

## 2022-06-09 ENCOUNTER — Encounter (HOSPITAL_COMMUNITY): Payer: Self-pay

## 2022-06-09 NOTE — ED Triage Notes (Signed)
Patient c/o chest pain today. Patient states that her phone alerted her that she had an irregular rhythm. Patient states she had chest pain and SOB at that time. [phone]).

## 2022-08-28 ENCOUNTER — Other Ambulatory Visit: Payer: Self-pay | Admitting: Cardiovascular Disease

## 2023-01-22 ENCOUNTER — Other Ambulatory Visit: Payer: Self-pay | Admitting: Physician Assistant

## 2023-02-04 ENCOUNTER — Emergency Department (HOSPITAL_BASED_OUTPATIENT_CLINIC_OR_DEPARTMENT_OTHER): Payer: Medicare PPO

## 2023-02-04 ENCOUNTER — Emergency Department (HOSPITAL_BASED_OUTPATIENT_CLINIC_OR_DEPARTMENT_OTHER)
Admission: EM | Admit: 2023-02-04 | Discharge: 2023-02-04 | Disposition: A | Discharge status: Home / Self Care | Payer: Medicare PPO | Attending: Emergency Medicine | Admitting: Emergency Medicine

## 2023-02-04 ENCOUNTER — Encounter (HOSPITAL_BASED_OUTPATIENT_CLINIC_OR_DEPARTMENT_OTHER): Payer: Self-pay | Admitting: Emergency Medicine

## 2023-02-04 DIAGNOSIS — Z7902 Long term (current) use of antithrombotics/antiplatelets: Secondary | ICD-10-CM | POA: Diagnosis not present

## 2023-02-04 DIAGNOSIS — Z7982 Long term (current) use of aspirin: Secondary | ICD-10-CM | POA: Insufficient documentation

## 2023-02-04 DIAGNOSIS — R42 Dizziness and giddiness: Secondary | ICD-10-CM | POA: Diagnosis present

## 2023-02-04 LAB — CBC WITH DIFFERENTIAL/PLATELET
Abs Immature Granulocytes: 0.01 10*3/uL (ref 0.00–0.07)
Basophils Absolute: 0 10*3/uL (ref 0.0–0.1)
Basophils Relative: 0 %
Eosinophils Absolute: 0.1 10*3/uL (ref 0.0–0.5)
Eosinophils Relative: 2 %
HCT: 45 % (ref 36.0–46.0)
Hemoglobin: 14.8 g/dL (ref 12.0–15.0)
Immature Granulocytes: 0 %
Lymphocytes Relative: 37 %
Lymphs Abs: 2.1 10*3/uL (ref 0.7–4.0)
MCH: 31.1 pg (ref 26.0–34.0)
MCHC: 32.9 g/dL (ref 30.0–36.0)
MCV: 94.5 fL (ref 80.0–100.0)
Monocytes Absolute: 0.5 10*3/uL (ref 0.1–1.0)
Monocytes Relative: 9 %
Neutro Abs: 2.9 10*3/uL (ref 1.7–7.7)
Neutrophils Relative %: 52 %
Platelets: 273 10*3/uL (ref 150–400)
RBC: 4.76 MIL/uL (ref 3.87–5.11)
RDW: 12.8 % (ref 11.5–15.5)
WBC: 5.6 10*3/uL (ref 4.0–10.5)
nRBC: 0 % (ref 0.0–0.2)

## 2023-02-04 LAB — COMPREHENSIVE METABOLIC PANEL
ALT: 23 U/L (ref 0–44)
AST: 20 U/L (ref 15–41)
Albumin: 3.9 g/dL (ref 3.5–5.0)
Alkaline Phosphatase: 122 U/L (ref 38–126)
Anion gap: 5 (ref 5–15)
BUN: 14 mg/dL (ref 8–23)
CO2: 24 mmol/L (ref 22–32)
Calcium: 8.8 mg/dL — ABNORMAL LOW (ref 8.9–10.3)
Chloride: 109 mmol/L (ref 98–111)
Creatinine, Ser: 0.75 mg/dL (ref 0.44–1.00)
GFR, Estimated: >60 mL/min (ref >60)
Glucose, Bld: 114 mg/dL — ABNORMAL HIGH (ref 70–99)
Potassium: 3.7 mmol/L (ref 3.5–5.1)
Sodium: 138 mmol/L (ref 135–145)
Total Bilirubin: 0.8 mg/dL (ref 0.3–1.2)
Total Protein: 7.6 g/dL (ref 6.5–8.1)

## 2023-02-04 MED ORDER — METOCLOPRAMIDE HCL 5 MG/ML IJ SOLN
5.0000 mg | Freq: Once | INTRAMUSCULAR | Status: AC
Start: 2023-02-04 — End: 2023-02-04
  Administered 2023-02-04: 5 mg via INTRAVENOUS
  Filled 2023-02-04: qty 2

## 2023-02-04 MED ORDER — DIPHENHYDRAMINE HCL 50 MG/ML IJ SOLN
12.5000 mg | Freq: Once | INTRAMUSCULAR | Status: AC
  Administered 2023-02-04: 12.5 mg via INTRAVENOUS
  Filled 2023-02-04: qty 1

## 2023-02-04 MED ORDER — SODIUM CHLORIDE 0.9 % IV BOLUS
1000.0000 mL | Freq: Once | INTRAVENOUS | Status: AC
  Administered 2023-02-04: 1000 mL via INTRAVENOUS

## 2023-02-04 MED ORDER — MECLIZINE HCL 25 MG PO TABS
25.0000 mg | ORAL_TABLET | Freq: Once | ORAL | Status: AC
  Administered 2023-02-04: 25 mg via ORAL
  Filled 2023-02-04: qty 1

## 2023-02-04 MED ORDER — MECLIZINE HCL 12.5 MG PO TABS: 12.5000 mg | ORAL_TABLET | Freq: Two times a day (BID) | ORAL | 0 refills | Status: DC | PRN

## 2023-02-04 NOTE — ED Notes (Signed)
Pt assisted to [facility] without difficulty

## 2023-02-04 NOTE — ED Provider Notes (Signed)
  Physical Exam  BP (!) 149/70   Pulse (!) 50   Temp 97.7 F (36.5 C)   Resp 15   Ht 5' 5"$  (1.651 m)   Wt 103 kg   SpO2 97%   BMI 37.77 kg/m   Physical Exam  Procedures  Procedures  ED Course / MDM    Medical Decision Making Care assumed at 3 PM.  Patient has a history of peripheral vertigo.  Patient is here with acute onset of dizziness that appears to be peripheral vertigo.  Patient signed out pending CT head  4:01 PM Labs unremarkable CT head unremarkable.  Patient able to ambulate after meclizine.  Will refer her back to see her neurologist at [facility].   Amount and/or Complexity of Data Reviewed Labs: ordered. Decision-making details documented in ED Course. Radiology: ordered and independent interpretation performed. Decision-making details documented in ED Course.  Risk Prescription drug management.          Drenda Freeze, MD 02/04/23 [phone]

## 2023-02-04 NOTE — ED Notes (Signed)
Pt has returned form CT and EDP is at bedside

## 2023-02-04 NOTE — ED Notes (Signed)
ED Provider at bedside. 

## 2023-02-04 NOTE — Discharge Instructions (Signed)
You likely have benign positional vertigo.  I have prescribed meclizine 12.5 mg 2 times daily as needed.  I referred you back to see your neurologist, Dr. Ermalene Postin, at [facility]  Return to ER if you have worse dizziness, headache, vomiting, trouble speaking, unable to walk

## 2023-02-04 NOTE — ED Triage Notes (Signed)
Pt sts she has felt "wobbly" since she woke up at 0800; sts she is leaning to the RT when she tries to walk; c/o dizziness

## 2023-02-04 NOTE — ED Notes (Signed)
Patient transported to CT 

## 2023-02-04 NOTE — ED Provider Notes (Signed)
[facility] Provider Note   CSN: [id number] Arrival date & time: 02/04/23  1356     History  Chief Complaint  Patient presents with   Dizziness    Katherine Pratt is a 72 y.o. female.  72 yo F with a chief complaint of dizziness.  This started this morning about 8 AM.  She said that she turned over rapidly in bed and then felt quite dizzy she thinks maybe it was because she was trying to get up too fast.  Since then she is felt a bit unsteady especially with walking.  She feels like she tends to lean to 1 side when she walks.  She has felt like the left side of her head does not feel right.  She has trouble describing that any further.  She has been having trouble with her right ear as well.  She denies one-sided numbness denies difficulty with speech or swallowing denies trauma to the head or the neck.  She had a from like this in the past and is not sure exactly what they told her it was.  She thinks they never figured out exactly what caused it.  She denies cough congestion or fever.   Dizziness      Home Medications Prior to Admission medications   Medication Sig Start Date End Date Taking? Authorizing Provider  amLODipine (NORVASC) 10 MG tablet Take 10 mg by mouth daily. 03/16/17  Yes [provider]  aspirin EC 81 MG EC tablet Take 1 tablet (81 mg total) by mouth daily. Swallow whole. 12/20/21  Yes Gold, Wayne E, PA-C  atorvastatin (LIPITOR) 80 MG tablet Take 1 tablet (80 mg total) by mouth daily. Please keep scheduled appointment for future refills. Thank you. 01/22/23  Yes Kathlen Mody, Scott T, PA-C  clopidogrel (PLAVIX) 75 MG tablet Take 1 tablet (75 mg total) by mouth daily. Please keep scheduled appointment for future refills. Thank you. 01/22/23   Richardson Dopp T, PA-C  JARDIANCE 10 MG TABS tablet TAKE 1 TABLET BY MOUTH EVERY DAY 08/28/22   Burnell Blanks, MD  lisinopril (ZESTRIL) 20 MG tablet Take 1 tablet (20 mg total)  by mouth daily. 02/21/22   Burnell Blanks, MD  metoprolol tartrate (LOPRESSOR) 25 MG tablet Take 1 tablet (25 mg total) by mouth 2 (two) times daily. Please keep scheduled appointment for future refills. Thank you. 01/22/23   Richardson Dopp T, PA-C  polyvinyl alcohol (LIQUIFILM TEARS) 1.4 % ophthalmic solution Place 1 drop into both eyes as needed for dry eyes.    [provider]  potassium chloride SA (KLOR-CON M) 20 MEQ tablet Take 1 tablet (20 mEq total) by mouth daily. 01/10/22 02/04/23  Richardson Dopp T, PA-C  TRULICITY 3 0000000 SOPN Inject 3 mg into the skin once a week. 10/30/21   [provider]      Allergies    Aspirin, Keflex [cephalexin], and Losartan    Review of Systems   Review of Systems  Neurological:  Positive for dizziness.    Physical Exam Updated Vital Signs BP (!) 160/72 (BP Location: Right Arm)   Pulse 64   Temp 97.7 F (36.5 C)   Resp 20   Ht '5\' 5"'$  (1.651 m)   Wt 103 kg   SpO2 98%   BMI 37.77 kg/m  Physical Exam Vitals and nursing note reviewed.  Constitutional:      General: She is not in acute distress.    Appearance: She  is well-developed. She is not diaphoretic.  HENT:     Head: Normocephalic and atraumatic.  Eyes:     Pupils: Pupils are equal, round, and reactive to light.  Cardiovascular:     Rate and Rhythm: Normal rate and regular rhythm.     Heart sounds: No murmur heard.    No friction rub. No gallop.  Pulmonary:     Effort: Pulmonary effort is normal.     Breath sounds: No wheezing or rales.  Abdominal:     General: There is no distension.     Palpations: Abdomen is soft.     Tenderness: There is no abdominal tenderness.  Musculoskeletal:        General: No tenderness.     Cervical back: Normal range of motion and neck supple.  Skin:    General: Skin is warm and dry.  Neurological:     Mental Status: She is alert and oriented to person, place, and time.     GCS: GCS eye subscore is 4. GCS verbal subscore is  5. GCS motor subscore is 6.     Cranial Nerves: Cranial nerves 2-12 are intact.     Sensory: Sensation is intact.     Motor: Motor function is intact.     Coordination: Coordination is intact.     Comments: Patient holds her head very still.  Right-sided fast going nystagmus.  Otherwise benign neurologic exam.  Psychiatric:        Behavior: Behavior normal.     ED Results / Procedures / Treatments   Labs (all labs ordered are listed, but only abnormal results are displayed) Labs Reviewed  CBC WITH DIFFERENTIAL/PLATELET  COMPREHENSIVE METABOLIC PANEL    EKG EKG Interpretation  Date/Time:  Wednesday February 04 2023 14:05:00 EST Ventricular Rate:  63 PR Interval:  350 QRS Duration: 97 QT Interval:  421 QTC Calculation: 431 R Axis:   79 Text Interpretation: Sinus rhythm Ventricular premature complex Prolonged PR interval Since last tracing rate slower Otherwise no significant change Confirmed by Deno Etienne [phone]) on 02/04/2023 2:10:18 PM  Radiology No results found.  Procedures Procedures    Medications Ordered in ED Medications  sodium chloride 0.9 % bolus 1,000 mL (1,000 mLs Intravenous New Bag/Given 02/04/23 1455)  metoCLOPramide (REGLAN) injection 5 mg (5 mg Intravenous Given 02/04/23 1456)  diphenhydrAMINE (BENADRYL) injection 12.5 mg (12.5 mg Intravenous Given 02/04/23 1456)  meclizine (ANTIVERT) tablet 25 mg (25 mg Oral Given 02/04/23 1433)    ED Course/ Medical Decision Making/ A&P                             Medical Decision Making Amount and/or Complexity of Data Reviewed Labs: ordered. Radiology: ordered.  Risk Prescription drug management.   72 yo F with a chief complaints of dizziness.  This started this morning when she woke up.  By history it sounds most like BPPV.  Patient does things like laying back flat or turning her head that tend to make her very dizzy and she has to hold herself in a certain position and gets better.  She has had some issue  ambulating.  No focal neurologic deficit on my exam.  Will obtain a CT scan of the head.  Blood work.  Bolus of IV fluids.  Attempt to treat her dizziness and nausea.  Reassess.  Signed out to Dr. Darl Householder, please see their note for further details of care.  The patients results  and plan were reviewed and discussed.   Any x-rays performed were independently reviewed by myself.   Differential diagnosis were considered with the presenting HPI.  Medications  sodium chloride 0.9 % bolus 1,000 mL (1,000 mLs Intravenous New Bag/Given 02/04/23 1455)  metoCLOPramide (REGLAN) injection 5 mg (5 mg Intravenous Given 02/04/23 1456)  diphenhydrAMINE (BENADRYL) injection 12.5 mg (12.5 mg Intravenous Given 02/04/23 1456)  meclizine (ANTIVERT) tablet 25 mg (25 mg Oral Given 02/04/23 1433)    Vitals:   02/04/23 1402 02/04/23 1403  BP: (!) 160/72   Pulse: 64   Resp: 20   Temp: 97.7 F (36.5 C)   SpO2: 98%   Weight:  103 kg  Height:  '5\' 5"'$  (1.651 m)    Final diagnoses:  Dizziness           Final Clinical Impression(s) / ED Diagnoses Final diagnoses:  Dizziness    Rx / DC Orders ED Discharge Orders     None         Deno Etienne, DO 02/04/23 1506

## 2023-02-21 ENCOUNTER — Other Ambulatory Visit: Payer: Self-pay | Admitting: Cardiovascular Disease

## 2023-02-22 NOTE — Progress Notes (Signed)
No chief complaint on file.  History of Present Illness: 72 yo female with history of CAD s/p CABG, obesity, diabetes, HTN, hyperlipidemia and breast cancer here today for cardiac follow up. She was admitted to [facility] in January 2023 with a NSTEMI. Cardiac cath with severe three vessel CAD. She underwent 4V CABG in January 2023. Echo 12/11/21 with LVEF=55-60%, no significant valve disease. She was doing well when seen in our office February 2023. Cardiac monitor February 2023 with PVCs. Metoprolol dose increased.   She is here today for follow up. The patient denies any chest pain, dyspnea, palpitations, lower extremity edema, orthopnea, PND, dizziness, near syncope or syncope.   Primary Care Physician: Mariel Sleet   Past Medical History:  Diagnosis Date   Aortic atherosclerosis ([place]) 01/09/2022   Chest CTA 12/09/2021: Aortic atherosclerosis   Breast cancer (HCC)    CAD (coronary artery disease) 01/09/2022   NSTEMI s/p CABG in 12/2021 (LIMA-LAD, S-OM1, S-OM2, S-PDA; endoscopic vein harvest from right leg) Pre-CABG Dopplers 12/12/2021: Bilateral ICA 1-39 Echocardiogram 12/11/2021: EF 55-60, no RWMA, mild LVH, normal RVSF, normal PASP, trivial MR   Diabetes mellitus without complication (HCC)    HLD (hyperlipidemia) 08/01/2015   Hypertension     Past Surgical History:  Procedure Laterality Date   ABDOMINAL HYSTERECTOMY     APPENDECTOMY     BREAST LUMPECTOMY     CORONARY ARTERY BYPASS GRAFT N/A 12/13/2021   Procedure: CORONARY ARTERY BYPASS GRAFTING (CABG) X  4  , ON PUMP, USING LEFT INTERNAL MAMMARY ARTERY AND RIGHT ENDOSCOPIC GREATER SAPHENOUS VEIN CONDUITS;  Surgeon: Gaye Pollack, MD;  Location: [place];  Service: Open Heart Surgery;  Laterality: N/A;   ELBOW SURGERY     ENDOVEIN HARVEST OF GREATER SAPHENOUS VEIN Right 12/13/2021   Procedure: ENDOVEIN HARVEST OF GREATER SAPHENOUS VEIN;  Surgeon: Gaye Pollack, MD;  Location: [place];  Service: Open Heart Surgery;  Laterality: Right;    HERNIA REPAIR     KNEE SURGERY     LEFT HEART CATH AND CORONARY ANGIOGRAPHY N/A 12/11/2021   Procedure: LEFT HEART CATH AND CORONARY ANGIOGRAPHY;  Surgeon: Troy Sine, MD;  Location: [place] [facility];  Service: Cardiovascular;  Laterality: N/A;   TEE WITHOUT CARDIOVERSION N/A 12/13/2021   Procedure: TRANSESOPHAGEAL ECHOCARDIOGRAM (TEE);  Surgeon: Gaye Pollack, MD;  Location: [place];  Service: Open Heart Surgery;  Laterality: N/A;    Current Outpatient Medications  Medication Sig Dispense Refill   amLODipine (NORVASC) 10 MG tablet Take 10 mg by mouth daily.     aspirin EC 81 MG EC tablet Take 1 tablet (81 mg total) by mouth daily. Swallow whole.     atorvastatin (LIPITOR) 80 MG tablet Take 1 tablet (80 mg total) by mouth daily. Please keep scheduled appointment for future refills. Thank you. 90 tablet 0   clopidogrel (PLAVIX) 75 MG tablet Take 1 tablet (75 mg total) by mouth daily. Please keep scheduled appointment for future refills. Thank you. 90 tablet 0   EPINEPHrine 0.3 mg/0.3 mL IJ SOAJ injection Inject 0.3 mg into the muscle as needed for anaphylaxis.     JARDIANCE 10 MG TABS tablet TAKE 1 TABLET BY MOUTH EVERY DAY 90 tablet 1   lisinopril (ZESTRIL) 20 MG tablet Take 1 tablet (20 mg total) by mouth daily. 90 tablet 3   meclizine (ANTIVERT) 12.5 MG tablet Take 1 tablet (12.5 mg total) by mouth 2 (two) times daily as needed for dizziness. 15 tablet 0  metoprolol tartrate (LOPRESSOR) 25 MG tablet Take 1 tablet (25 mg total) by mouth 2 (two) times daily. Please keep scheduled appointment for future refills. Thank you. 180 tablet 0   polyvinyl alcohol (LIQUIFILM TEARS) 1.4 % ophthalmic solution Place 1 drop into both eyes as needed for dry eyes.     potassium chloride SA (KLOR-CON M) 20 MEQ tablet Take 1 tablet (20 mEq total) by mouth daily. 90 tablet 3   TRULICITY 3 0000000 SOPN Inject 3 mg into the skin once a week.     No current facility-administered medications for this visit.     Allergies  Allergen Reactions   Aspirin Other (See Comments)    On meloxicam   Keflex [Cephalexin] Hives   Losartan Rash    Social History   Socioeconomic History   Marital status: Married    Spouse name: Not on file   Number of children: Not on file   Years of education: Not on file   Highest education level: Not on file  Occupational History   Not on file  Tobacco Use   Smoking status: Never   Smokeless tobacco: Never  Vaping Use   Vaping Use: Never used  Substance and Sexual Activity   Alcohol use: No   Drug use: No   Sexual activity: Not on file  Other Topics Concern   Not on file  Social History Narrative   Not on file   Social Determinants of Health   Financial Resource Strain: Not on file  Food Insecurity: Not on file  Transportation Needs: Not on file  Physical Activity: Not on file  Stress: Not on file  Social Connections: Not on file  Intimate Partner Violence: Not on file    No family history on file.  Review of Systems:  As stated in the HPI and otherwise negative.   There were no vitals taken for this visit.  Physical Examination: General: Well developed, well nourished, NAD  HEENT: OP clear, mucus membranes moist  SKIN: warm, dry. No rashes. Neuro: No focal deficits  Musculoskeletal: Muscle strength 5/5 all ext  Psychiatric: Mood and affect normal  Neck: No JVD, no carotid bruits, no thyromegaly, no lymphadenopathy.  Lungs:Clear bilaterally, no wheezes, rhonci, crackles Cardiovascular: Regular rate and rhythm. No murmurs, gallops or rubs. Abdomen:Soft. Bowel sounds present. Non-tender.  Extremities: No lower extremity edema. Pulses are 2 + in the bilateral DP/PT.  EKG:  EKG is not *** ordered today. The ekg ordered today demonstrates   Echo 12/11/21:  1. Aortic valve appears calcified with indeterminant number of cusps; no  AS by doppler.   2. Left ventricular ejection fraction, by estimation, is 55 to 60%. The  left ventricle has  normal function. The left ventricle has no regional  wall motion abnormalities. There is mild left ventricular hypertrophy.  Left ventricular diastolic parameters  are indeterminate.   3. Right ventricular systolic function is normal. The right ventricular  size is normal. There is normal pulmonary artery systolic pressure.   4. The mitral valve is normal in structure. Trivial mitral valve  regurgitation. No evidence of mitral stenosis.   5. The aortic valve is normal in structure. Aortic valve regurgitation is  not visualized. No aortic stenosis is present.   6. The inferior vena cava is dilated in size with >50% respiratory  variability, suggesting right atrial pressure of 8 mmHg.   Recent Labs: 03/03/2022: Magnesium 2.3 02/04/2023: ALT 23; BUN 14; Creatinine, Ser 0.75; Hemoglobin 14.8; Platelets 273; Potassium 3.7;  Sodium 138   Lipid Panel    Component Value Date/Time   CHOL 128 03/03/2022 1015   TRIG 71 03/03/2022 1015   HDL 38 (L) 03/03/2022 1015   CHOLHDL 3.4 03/03/2022 1015   CHOLHDL 4.8 12/10/2021 1407   VLDL 19 12/10/2021 1407   LDLCALC 75 03/03/2022 1015     Wt Readings from Last 3 Encounters:  02/04/23 103 kg  03/03/22 106.2 kg  01/15/22 107.5 kg    Assessment and Plan:   1. CAD s/p CABG without angina: No chest pain suggestive of angina. Will continue ASA, statin and beta blocker. *** ? Stop Plavix.    2. HTN: BP is controlled. No changes  3. Hyperlipidemia: LDL 75 in March 2023. Continue statin. ***Repeat lipids and LFTs now.  4. PVCs: No palpitations. Continue beta blocker.   5. Aortic atherosclerosis: continue statin  Labs/ tests ordered today include:   No orders of the defined types were placed in this encounter.    Disposition:   F/U with me in 6 months.    Signed, Lauree Chandler, MD 02/22/2023 12:10 PM    [facility] [place], [place], [place]  [address] Phone: [phone]; Fax: [phone]

## 2023-02-23 ENCOUNTER — Ambulatory Visit: Payer: Medicare PPO | Attending: Cardiovascular Disease | Admitting: Cardiovascular Disease

## 2023-02-23 ENCOUNTER — Encounter: Payer: Self-pay | Admitting: Cardiovascular Disease

## 2023-02-23 VITALS — BP 122/60 | HR 59 | Ht 65.0 in | Wt 231.6 lb

## 2023-02-23 DIAGNOSIS — E7800 Pure hypercholesterolemia, unspecified: Secondary | ICD-10-CM

## 2023-02-23 DIAGNOSIS — I10 Essential (primary) hypertension: Secondary | ICD-10-CM | POA: Diagnosis not present

## 2023-02-23 DIAGNOSIS — I2510 Atherosclerotic heart disease of native coronary artery without angina pectoris: Secondary | ICD-10-CM

## 2023-02-23 DIAGNOSIS — I700 Atherosclerosis of aorta: Secondary | ICD-10-CM

## 2023-02-23 DIAGNOSIS — I493 Ventricular premature depolarization: Secondary | ICD-10-CM

## 2023-02-23 MED ORDER — METOPROLOL TARTRATE 25 MG PO TABS: 25.0000 mg | ORAL_TABLET | Freq: Two times a day (BID) | ORAL | 3 refills | Status: DC

## 2023-02-23 MED ORDER — ROSUVASTATIN CALCIUM 40 MG PO TABS: 40.0000 mg | ORAL_TABLET | Freq: Every day | ORAL | 3 refills | Status: DC

## 2023-02-23 MED ORDER — EMPAGLIFLOZIN 10 MG PO TABS: 10.0000 mg | ORAL_TABLET | Freq: Every day | ORAL | 3 refills | Status: DC

## 2023-02-23 MED ORDER — LISINOPRIL 20 MG PO TABS: 20.0000 mg | ORAL_TABLET | Freq: Every day | ORAL | 3 refills | Status: DC

## 2023-02-23 MED ORDER — POTASSIUM CHLORIDE CRYS ER 20 MEQ PO TBCR: 20.0000 meq | EXTENDED_RELEASE_TABLET | Freq: Every day | ORAL | 3 refills | Status: DC

## 2023-02-23 NOTE — Patient Instructions (Signed)
Medication Instructions:  Your physician has recommended you make the following change in your medication:  1.) stop atorvastatin (Lipitor) 2.) stop clopidogrel (Plavix) 3.) start rosuvastatin (Crestor) 40 mg - take one tablet daily  *If you need a refill on your cardiac medications before your next appointment, please call your pharmacy*   Lab Work: Please return in 12 weeks for lipids/liver function If you have labs (blood work) drawn today and your tests are completely normal, you will receive your results only by: [place] (if you have MyChart) OR A paper copy in the mail If you have any lab test that is abnormal or we need to change your treatment, we will call you to review the results.   Testing/Procedures: none   Follow-Up: At [facility], you and your health needs are our priority.  As part of our continuing mission to provide you with exceptional heart care, we have created designated Provider Care Teams.  These Care Teams include your primary Cardiologist (physician) and Advanced Practice Providers (APPs -  Physician Assistants and Nurse Practitioners) who all work together to provide you with the care you need, when you need it.  We recommend signing up for the patient portal called "MyChart".  Sign up information is provided on this After Visit Summary.  MyChart is used to connect with patients for Virtual Visits (Telemedicine).  Patients are able to view lab/test results, encounter notes, upcoming appointments, etc.  Non-urgent messages can be sent to your provider as well.   To learn more about what you can do with MyChart, go to [email or web address].    Your next appointment:   12 month(s)  Provider:   Lauree Chandler, MD

## 2023-04-25 ENCOUNTER — Other Ambulatory Visit: Payer: Self-pay | Admitting: Physician Assistant

## 2023-05-18 ENCOUNTER — Ambulatory Visit: Payer: Medicare HMO | Attending: Cardiovascular Disease

## 2023-05-18 DIAGNOSIS — E7800 Pure hypercholesterolemia, unspecified: Secondary | ICD-10-CM

## 2023-05-19 LAB — HEPATIC FUNCTION PANEL
ALT: 15 IU/L (ref 0–32)
AST: 21 IU/L (ref 0–40)
Albumin: 3.9 g/dL (ref 3.8–4.8)
Alkaline Phosphatase: 127 IU/L — ABNORMAL HIGH (ref 44–121)
Bilirubin Total: 0.3 mg/dL (ref 0.0–1.2)
Bilirubin, Direct: 0.1 mg/dL (ref 0.00–0.40)
Total Protein: 6.5 g/dL (ref 6.0–8.5)

## 2023-05-19 LAB — LIPID PANEL
Chol/HDL Ratio: 3.1 ratio (ref 0.0–4.4)
Cholesterol, Total: 129 mg/dL (ref 100–199)
HDL: 41 mg/dL (ref >39)
LDL Chol Calc (NIH): 74 mg/dL (ref 0–99)
Triglycerides: 70 mg/dL (ref 0–149)
VLDL Cholesterol Cal: 14 mg/dL (ref 5–40)

## 2023-05-22 ENCOUNTER — Telehealth: Payer: Self-pay | Admitting: Cardiovascular Disease

## 2023-05-22 NOTE — Telephone Encounter (Signed)
Reviewed results w pt.  Pleased to hear.

## 2023-05-22 NOTE — Telephone Encounter (Signed)
Patient returned RN's call. 

## 2023-08-25 ENCOUNTER — Telehealth: Payer: Self-pay | Admitting: Cardiovascular Disease

## 2023-08-25 NOTE — Telephone Encounter (Signed)
I spoke w the patient.  She has swelling in both legs but considerably more on the left than right.  She said it is better first thing in morning.  Left leg swelling goes up to her knee.  This worries her.  No new SOB.  She saw PCP yesterday who ordered a LE ultrasound of the left leg and started her on Lasix 20 mg daily.  She also said in her labs yesterday the LFTs were slightly elevated.  Repeating next week.  We discussed elevatiing her legs when sitting and minimizing salt which she said she watches closely.   I asked her to call back next week w an update on the swelling and her weight.

## 2023-08-25 NOTE — Telephone Encounter (Signed)
Pt c/o swelling: STAT is pt has developed SOB within 24 hours  If swelling, where is the swelling located? Legs and feet   How much weight have you gained and in what time span? 7 pounds and a month   Have you gained 3 pounds in a day or 5 pounds in a week? No   Do you have a log of your daily weights (if so, list)? No   Are you currently taking a fluid pill? Yes   Are you currently SOB? No   Have you traveled recently? Yes

## 2023-08-28 ENCOUNTER — Telehealth: Payer: Self-pay | Admitting: Cardiovascular Disease

## 2023-08-28 NOTE — Telephone Encounter (Signed)
Patient is calling to report Dr. Piedad Climes ordered a Doppler that showed she does not have and blood clots in her legs.   She was advised the office and report this.  Please advise.

## 2023-08-28 NOTE — Telephone Encounter (Signed)
Noted.  Pt is to call back next week w update on swelling and weights.

## 2023-12-04 ENCOUNTER — Other Ambulatory Visit: Payer: Self-pay | Admitting: Physician Assistant

## 2023-12-04 ENCOUNTER — Other Ambulatory Visit: Payer: Self-pay | Admitting: Cardiovascular Disease

## 2023-12-09 DIAGNOSIS — I350 Nonrheumatic aortic (valve) stenosis: Secondary | ICD-10-CM

## 2023-12-09 HISTORY — DX: Nonrheumatic aortic (valve) stenosis: I35.0

## 2024-01-18 NOTE — Progress Notes (Deleted)
 Cardiology Office Note    Patient Name: Katherine Pratt Date of Encounter: 01/18/2024  Primary Care Provider:  Burnis Medin, [place] Primary Cardiologist:  Verne Carrow, MD Primary Electrophysiologist: None   Past Medical History    Past Medical History:  Diagnosis Date   Aortic atherosclerosis (HCC) 01/09/2022   Chest CTA 12/09/2021: Aortic atherosclerosis   Breast cancer (HCC)    CAD (coronary artery disease) 01/09/2022   NSTEMI s/p CABG in 12/2021 (LIMA-LAD, S-OM1, S-OM2, S-PDA; endoscopic vein harvest from right leg) Pre-CABG Dopplers 12/12/2021: Bilateral ICA 1-39 Echocardiogram 12/11/2021: EF 55-60, no RWMA, mild LVH, normal RVSF, normal PASP, trivial MR   Diabetes mellitus without complication (HCC)    HLD (hyperlipidemia) 08/01/2015   Hypertension     History of Present Illness  Katherine Pratt is a 73 y.o. female with a PMH of CAD s/p NSTEMI and CABG x 4 12/2021, DM type II, HTN, HLD, morbid obesity, right breast CA s/p lumpectomy with radiation who presents today for 59-month follow-up.  Ms. Hoh was initially seen in 12/2021 for NSTEMI and underwent LHC that showed three-vessel CAD.  She underwent CABG x 4 with Dr. Laneta Simmers with (LIMA-LAD, S-OM1, S-OM2, S-PDA; endoscopic vein harvest from right leg).  2D echo was completed showing EF of 55 to 60% with no significant valve disease.  She was seen in follow-up by Tereso Newcomer, PA and wore a ZIO monitor for asymptomatic PVCs that showed frequent PVCs and metoprolol was increased to 50 mg twice daily.  She was last seen by Dr. Clifton James on 02/2023 and reported doing well with no complaints of chest pain or shortness of breath.  She had Plavix discontinued and was continued on beta-blocker for PVCs.  She was seen in the ED on 02/04/2023 with complaint of dizziness and underwent CT scan of the head that was normal.  She was referred back to her neurologist at [facility] neurology.  During today's visit the patient reports*** .  Patient denies  chest pain, palpitations, dyspnea, PND, orthopnea, nausea, vomiting, dizziness, syncope, edema, weight gain, or early satiety.  ***Notes: -Last ischemic evaluation: -Last echo: -Interim ED visits: Review of Systems  Please see the history of present illness.    All other systems reviewed and are otherwise negative except as noted above.  Physical Exam    Wt Readings from Last 3 Encounters:  02/23/23 231 lb 9.6 oz (105.1 kg)  02/04/23 227 lb (103 kg)  03/03/22 234 lb 3.2 oz (106.2 kg)   [id number] were no vitals filed for this visit.,There is no height or weight on file to calculate BMI. GEN: Well nourished, well developed in no acute distress Neck: No JVD; No carotid bruits Pulmonary: Clear to auscultation without rales, wheezing or rhonchi  Cardiovascular: Normal rate. Regular rhythm. Normal S1. Normal S2.   Murmurs: There is no murmur.  ABDOMEN: Soft, non-tender, non-distended EXTREMITIES:  No edema; No deformity   EKG/LABS/ Recent Cardiac Studies   ECG personally reviewed by me today - ***  Risk Assessment/Calculations:   {Does this patient have ATRIAL FIBRILLATION?:[id number]}      Lab Results  Component Value Date   WBC 5.6 02/04/2023   HGB 14.8 02/04/2023   HCT 45.0 02/04/2023   MCV 94.5 02/04/2023   PLT 273 02/04/2023   Lab Results  Component Value Date   CREATININE 0.75 02/04/2023   BUN 14 02/04/2023   NA 138 02/04/2023   K 3.7 02/04/2023   CL 109 02/04/2023   CO2 24 02/04/2023  Lab Results  Component Value Date   CHOL 129 05/18/2023   HDL 41 05/18/2023   LDLCALC 74 05/18/2023   TRIG 70 05/18/2023   CHOLHDL 3.1 05/18/2023    Lab Results  Component Value Date   HGBA1C 7.0 (H) 12/10/2021   Assessment & Plan    1.  History of CAD  2.  Primary hypertension  3.  Hyperlipidemia  4.  History of PVCs  5.  Aortic atherosclerosis:      Disposition: Follow-up with Verne Carrow, MD or APP in *** months {Are you ordering a CV Procedure  (e.g. stress test, cath, DCCV, TEE, etc)?   Press F2        :[id number]}   Signed, Napoleon Form, Leodis Rains, NP 01/18/2024, 12:24 PM [facility]

## 2024-01-19 ENCOUNTER — Ambulatory Visit: Payer: Medicare HMO | Admitting: Nurse Practitioner

## 2024-02-25 ENCOUNTER — Other Ambulatory Visit: Payer: Self-pay | Admitting: Cardiovascular Disease

## 2024-03-01 NOTE — Progress Notes (Unsigned)
 Cardiology Office Note    Patient Name: Katherine Pratt Date of Encounter: 03/02/2024  Primary Care Provider:  Burnis Medin, [place] Primary Cardiologist:  Verne Carrow, MD Primary Electrophysiologist: None   Past Medical History    Past Medical History:  Diagnosis Date   Aortic atherosclerosis (HCC) 01/09/2022   Chest CTA 12/09/2021: Aortic atherosclerosis   Breast cancer (HCC)    CAD (coronary artery disease) 01/09/2022   NSTEMI s/p CABG in 12/2021 (LIMA-LAD, S-OM1, S-OM2, S-PDA; endoscopic vein harvest from right leg) Pre-CABG Dopplers 12/12/2021: Bilateral ICA 1-39 Echocardiogram 12/11/2021: EF 55-60, no RWMA, mild LVH, normal RVSF, normal PASP, trivial MR   Diabetes mellitus without complication (HCC)    HLD (hyperlipidemia) 08/01/2015   Hypertension     History of Present Illness  Katherine Pratt is a 73 y.o. female with a PMH of CAD s/p NSTEMI and CABG x 4 12/2021, DM type II, HTN, HLD, morbid obesity, right breast CA s/p lumpectomy with radiation who presents today for 65-month follow-up.   Katherine Pratt was initially seen in 12/2021 for NSTEMI and underwent LHC that showed three-vessel CAD.  She underwent CABG x 4 with Dr. Laneta Simmers with (LIMA-LAD, S-OM1, S-OM2, S-PDA; with endoscopic vein harvest from right leg).  2D echo was completed showing EF of 55 to 60% with no significant valve disease.  She was seen in follow-up by Tereso Newcomer, PA and wore a ZIO monitor for asymptomatic PVCs that showed frequent PVCs and metoprolol was increased to 50 mg twice daily.  She was last seen by Dr. Clifton James on 02/23/2023 and reported doing well with no complaints of chest pain or shortness of breath.  She had Plavix discontinued and was continued on beta-blocker for PVCs.  She was seen in the ED on 02/04/2023 with complaint of dizziness and underwent CT scan of the head that was normal.  She was referred back to her neurologist at [facility] neurology.  He contacted our office on 08/25/2023 with complaint of  swelling in both of her legs and was seen by PCP who ordered LE ultrasounds and started her on Lasix.  Katherine Pratt presents today for annual follow-up.  During today's visit she reports some shortness of breath primarily during activities outside the home, such as walking or taking out the trash. This has been ongoing since she reduced her exercise routine last year. She does not experience shortness of breath while at home unless exerting herself. She was previously active in a heart rehabilitation program and has a Education officer, community. She reports swelling in both legs, which was previously evaluated with a lower extremity ultrasound showing no blockages. The swelling makes her legs feel heavy, particularly when walking between stores. She was previously prescribed Lasix by her primary care doctor. She experiences occasional short pains on the left side of her chest, which do not last long and are not associated with exertion. These pains occurred about a week or two ago. She reports sweating at night for the past month or two, describing it as 'hot sweat' rather than cold. No changes in diet or new supplements that could account for this. She notes her urine is sometimes darker yellow, which improves with increased water intake. She acknowledges not drinking as much water as she should, despite previously drinking water regularly Patient denies chest pain, palpitations, dyspnea, PND, orthopnea, nausea, vomiting, dizziness, syncope, edema, weight gain, or early satiety.  Discussed the use of AI scribe software for clinical note transcription with the patient, who gave  verbal consent to proceed.  History of Present Illness   Review of Systems  Please see the history of present illness.    All other systems reviewed and are otherwise negative except as noted above.  Physical Exam    Wt Readings from Last 3 Encounters:  03/02/24 232 lb 3.2 oz (105.3 kg)  02/23/23 231 lb 9.6 oz (105.1 kg)   02/04/23 227 lb (103 kg)   VS: Vitals:   03/02/24 1058  Pulse: 74  SpO2: 97%  ,Body mass index is 38.64 kg/m. GEN: Well nourished, well developed in no acute distress Neck: No JVD; No carotid bruits Pulmonary: Clear to auscultation without rales, wheezing or rhonchi  Cardiovascular: Normal rate. Regular rhythm. Normal S1. Normal S2.   Murmurs: There is no murmur.  ABDOMEN: Soft, non-tender, non-distended EXTREMITIES: Bilateral lower extremity edema  EKG/LABS/ Recent Cardiac Studies   ECG personally reviewed by me today -sinus rhythm with prolonged PR interval and bigeminal pattern with heart rate of 74 bpm.  Risk Assessment/Calculations:          Lab Results  Component Value Date   WBC 5.6 02/04/2023   HGB 14.8 02/04/2023   HCT 45.0 02/04/2023   MCV 94.5 02/04/2023   PLT 273 02/04/2023   Lab Results  Component Value Date   CREATININE 0.75 02/04/2023   BUN 14 02/04/2023   NA 138 02/04/2023   K 3.7 02/04/2023   CL 109 02/04/2023   CO2 24 02/04/2023   Lab Results  Component Value Date   CHOL 129 05/18/2023   HDL 41 05/18/2023   LDLCALC 74 05/18/2023   TRIG 70 05/18/2023   CHOLHDL 3.1 05/18/2023    Lab Results  Component Value Date   HGBA1C 7.0 (H) 12/10/2021   Assessment & Plan    1.  History of CAD: - CABG x 4 with Dr. Laneta Simmers with (LIMA-LAD, S-OM1, S-OM2, S-PDA 2023 -Today patient reports occasional episodes of chest discomfort that she describes as twinges no discomfort with exertion. -Continue current GDMT with Crestor 40 mg daily, ASA 81 mg, metoprolol 25 mg a.m. and 50 mg p.m. -Patient advised to increase physical activity to at least 150 minutes/week   2.  Primary hypertension: -Patient's blood pressure was elevated initially at 146/78 and was 142/68 on recheck. - Provide prescription for a blood pressure cuff for home monitoring. - Advise weekly home blood pressure monitoring. -Continue Norvasc 10 mg daily, lisinopril 20 mg daily, metoprolol 25  mg twice daily   3.  Hyperlipidemia: -Patient's last LDL cholesterol was 64 -Continue Crestor 40 mg daily   4.  History of PVCs: -Previous ZIO monitor worn that showed frequent PVCs and metoprolol was increased to 50 mg twice daily but was reduced to 25 mg twice daily -EG today showed bigeminal pattern and patient will have metoprolol increased to 25 mg in a.m. and 50 mg in the p.m. -Will plan event monitor at next follow-up to evaluate PVC burden.   5.  Dyspnea on exertion: Exertional dyspnea and leg edema likely due to deconditioning and fluid retention, possibly exacerbated by dietary sodium. - Prescribe Lasix 20 mg twice daily for three days, then as needed based on weight gain parameters (2 pounds in 24 hours or 5 pounds in a week). - Prescribe potassium chloride 20 mEq with Lasix. - Order echocardiogram to assess cardiac function. - Provide information on a low-sodium diet. - Encourage exercise resumption, possibly through cardiac rehabilitation.  Disposition: Follow-up with Verne Carrow, MD or APP in  3 months    Signed, Napoleon Form, Leodis Rains, NP 03/02/2024, 11:16 AM [facility]

## 2024-03-02 ENCOUNTER — Encounter: Payer: Self-pay | Admitting: Nurse Practitioner

## 2024-03-02 ENCOUNTER — Ambulatory Visit: Payer: Medicare HMO | Attending: Nurse Practitioner | Admitting: Nurse Practitioner

## 2024-03-02 ENCOUNTER — Telehealth: Payer: Self-pay

## 2024-03-02 VITALS — BP 122/68 | HR 74 | Ht 65.0 in | Wt 232.2 lb

## 2024-03-02 DIAGNOSIS — R0609 Other forms of dyspnea: Secondary | ICD-10-CM

## 2024-03-02 DIAGNOSIS — I493 Ventricular premature depolarization: Secondary | ICD-10-CM | POA: Diagnosis not present

## 2024-03-02 DIAGNOSIS — I2510 Atherosclerotic heart disease of native coronary artery without angina pectoris: Secondary | ICD-10-CM

## 2024-03-02 DIAGNOSIS — I700 Atherosclerosis of aorta: Secondary | ICD-10-CM

## 2024-03-02 DIAGNOSIS — E7800 Pure hypercholesterolemia, unspecified: Secondary | ICD-10-CM | POA: Diagnosis not present

## 2024-03-02 DIAGNOSIS — I10 Essential (primary) hypertension: Secondary | ICD-10-CM

## 2024-03-02 MED ORDER — METOPROLOL TARTRATE 25 MG PO TABS: ORAL_TABLET | ORAL | 1 refills | Status: AC

## 2024-03-02 MED ORDER — BLOOD PRESSURE CUFF MISC: 1.0000 | Freq: Every day | 0 refills | Status: AC

## 2024-03-02 MED ORDER — FUROSEMIDE 20 MG PO TABS: 20.0000 mg | ORAL_TABLET | Freq: Every day | ORAL | 1 refills | Status: DC

## 2024-03-02 MED ORDER — POTASSIUM CHLORIDE CRYS ER 20 MEQ PO TBCR: 20.0000 meq | EXTENDED_RELEASE_TABLET | Freq: Every day | ORAL | 0 refills | Status: DC

## 2024-03-02 NOTE — Patient Instructions (Addendum)
 Medication Instructions:  CHANGE Lasix to 20mg  Take 1 tablet twice a day for 3 days then go back to as needed START Potassium take 1 tablet daily for 3 days then take when taking Lasix INCREASE Metoprolol to 25mg  in the morning and 50mg  in the evening.  *If you need a refill on your cardiac medications before your next appointment, please call your pharmacy*   Lab Work: TODAY-BMET, MAG, LIPID, & LFT If you have labs (blood work) drawn today and your tests are completely normal, you will receive your results only by: MyChart Message (if you have MyChart) OR A paper copy in the mail If you have any lab test that is abnormal or we need to change your treatment, we will call you to review the results.   Testing/Procedures: Your physician has requested that you have an echocardiogram. Echocardiography is a painless test that uses sound waves to create images of your heart. It provides your doctor with information about the size and shape of your heart and how well your heart's chambers and valves are working. This procedure takes approximately one hour. There are no restrictions for this procedure. Please do NOT wear cologne, perfume, aftershave, or lotions (deodorant is allowed). Please arrive 15 minutes prior to your appointment time.  Please note: We ask at that you not bring children with you during ultrasound (echo/ vascular) testing. Due to room size and safety concerns, children are not allowed in the ultrasound rooms during exams. Our front office staff cannot provide observation of children in our lobby area while testing is being conducted. An adult accompanying a patient to their appointment will only be allowed in the ultrasound room at the discretion of the ultrasound technician under special circumstances. We apologize for any inconvenience.   Follow-Up: At [facility], you and your health needs are our priority.  As part of our continuing mission to provide you with  exceptional heart care, we have created designated Provider Care Teams.  These Care Teams include your primary Cardiologist (physician) and Advanced Practice Providers (APPs -  Physician Assistants and Nurse Practitioners) who all work together to provide you with the care you need, when you need it.  We recommend signing up for the patient portal called "MyChart".  Sign up information is provided on this After Visit Summary.  MyChart is used to connect with patients for Virtual Visits (Telemedicine).  Patients are able to view lab/test results, encounter notes, upcoming appointments, etc.  Non-urgent messages can be sent to your provider as well.   To learn more about what you can do with MyChart, go to [email or web address].    Your next appointment:   3 month(s)  Provider:   Robin Searing, NP  Other Instructions: Do not consume more than 64 ounces of water a day  Please contact the office if you gain more than 2lbs in a day or 5lbs in a week.  Limit your salt intake to 1500-2000mg  per day or 500mg  of Sodium per meal.  Check your blood pressure daily for 2 weeks, then contact the office with your readings.  Contact the office either by phone or MyChart with your readings.  Make sure to check your blood pressure 2 hours after taking your medications.   AVOID these things for 30 minutes before checking your blood pressure: No Drinking caffeine. No Drinking alcohol. No Eating. No Smoking. No Exercising.  Five minutes before checking your blood pressure: Pee. Sit in a dining chair. Avoid sitting in a  soft couch or armchair. Be quiet. Do not talk.

## 2024-03-02 NOTE — Telephone Encounter (Signed)
 Reviewed charts from today and noticed I forgot to inform the patient that Katherine Pratt wanted her to increase Metoprolol Tartrate to 1 (25mg ) tablet in the morning and 2 (25mg ) tabs in the evening.   Patient notified directly and voiced understanding.   Rx(s) sent to pharmacy electronically.

## 2024-03-02 NOTE — Addendum Note (Signed)
 Addended by: Alveta Heimlich on: 03/02/2024 04:40 PM   Modules accepted: Orders

## 2024-03-03 LAB — HEPATIC FUNCTION PANEL
ALT: 42 IU/L — ABNORMAL HIGH (ref 0–32)
AST: 32 IU/L (ref 0–40)
Albumin: 4.4 g/dL (ref 3.8–4.8)
Alkaline Phosphatase: 152 IU/L — ABNORMAL HIGH (ref 44–121)
Bilirubin Total: 0.6 mg/dL (ref 0.0–1.2)
Bilirubin, Direct: 0.24 mg/dL (ref 0.00–0.40)
Total Protein: 7.2 g/dL (ref 6.0–8.5)

## 2024-03-03 LAB — LIPID PANEL
Chol/HDL Ratio: 2.6 ratio (ref 0.0–4.4)
Cholesterol, Total: 132 mg/dL (ref 100–199)
HDL: 50 mg/dL (ref >39)
LDL Chol Calc (NIH): 70 mg/dL (ref 0–99)
Triglycerides: 56 mg/dL (ref 0–149)
VLDL Cholesterol Cal: 12 mg/dL (ref 5–40)

## 2024-03-03 LAB — BASIC METABOLIC PANEL WITH GFR
BUN/Creatinine Ratio: 16 (ref 12–28)
BUN: 12 mg/dL (ref 8–27)
CO2: 23 mmol/L (ref 20–29)
Calcium: 9.8 mg/dL (ref 8.7–10.3)
Chloride: 106 mmol/L (ref 96–106)
Creatinine, Ser: 0.73 mg/dL (ref 0.57–1.00)
Glucose: 93 mg/dL (ref 70–99)
Potassium: 4.2 mmol/L (ref 3.5–5.2)
Sodium: 142 mmol/L (ref 134–144)
eGFR: 87 mL/min/{1.73_m2} (ref >59)

## 2024-03-03 LAB — MAGNESIUM: Magnesium: 2.2 mg/dL (ref 1.6–2.3)

## 2024-03-10 ENCOUNTER — Telehealth: Payer: Self-pay | Admitting: Nurse Practitioner

## 2024-03-10 DIAGNOSIS — E7800 Pure hypercholesterolemia, unspecified: Secondary | ICD-10-CM

## 2024-03-10 MED ORDER — ROSUVASTATIN CALCIUM 20 MG PO TABS: 20.0000 mg | ORAL_TABLET | Freq: Every day | ORAL | 3 refills | Status: AC

## 2024-03-10 NOTE — Telephone Encounter (Signed)
 Gaston Islam., NP 03/03/2024  9:26 AM EDT     Please let patient know that her liver enzymes are elevated and total cholesterol numbers are at goal.  Your kidney function and electrolytes are also stable along with your magnesium.  Please change Crestor to 20 mg daily and recheck LFT and lipids in 8 weeks.  Please continue current treatment plan as discussed yesterday at your follow-up.     Robin Searing, NP     Patient aware of lab work results as posted above. Orders have been placed.

## 2024-03-10 NOTE — Telephone Encounter (Signed)
Patient returned call for lab results.  

## 2024-03-25 ENCOUNTER — Other Ambulatory Visit: Payer: Self-pay | Admitting: Nurse Practitioner

## 2024-04-04 ENCOUNTER — Ambulatory Visit (HOSPITAL_COMMUNITY): Attending: Cardiology

## 2024-04-04 DIAGNOSIS — I493 Ventricular premature depolarization: Secondary | ICD-10-CM | POA: Diagnosis not present

## 2024-04-04 DIAGNOSIS — E7800 Pure hypercholesterolemia, unspecified: Secondary | ICD-10-CM | POA: Diagnosis not present

## 2024-04-04 DIAGNOSIS — I2510 Atherosclerotic heart disease of native coronary artery without angina pectoris: Secondary | ICD-10-CM | POA: Insufficient documentation

## 2024-04-04 DIAGNOSIS — I10 Essential (primary) hypertension: Secondary | ICD-10-CM | POA: Insufficient documentation

## 2024-04-04 LAB — ECHOCARDIOGRAM COMPLETE
AR max vel: 1.37 cm2
AV Area VTI: 1.4 cm2
AV Area mean vel: 1.47 cm2
AV Mean grad: 10 mmHg
AV Peak grad: 19.6 mmHg
Ao pk vel: 2.22 m/s
Area-P 1/2: 4.74 cm2
S' Lateral: 3.3 cm

## 2024-04-06 ENCOUNTER — Other Ambulatory Visit: Payer: Self-pay

## 2024-04-06 DIAGNOSIS — I272 Pulmonary hypertension, unspecified: Secondary | ICD-10-CM

## 2024-04-10 ENCOUNTER — Emergency Department (HOSPITAL_BASED_OUTPATIENT_CLINIC_OR_DEPARTMENT_OTHER)

## 2024-04-10 ENCOUNTER — Other Ambulatory Visit: Payer: Self-pay

## 2024-04-10 ENCOUNTER — Observation Stay (HOSPITAL_BASED_OUTPATIENT_CLINIC_OR_DEPARTMENT_OTHER)
Admission: EM | Admit: 2024-04-10 | Discharge: 2024-04-12 | Disposition: A | Discharge status: Home / Self Care | Attending: Internal Medicine | Admitting: Internal Medicine

## 2024-04-10 ENCOUNTER — Encounter (HOSPITAL_BASED_OUTPATIENT_CLINIC_OR_DEPARTMENT_OTHER): Payer: Self-pay

## 2024-04-10 DIAGNOSIS — E785 Hyperlipidemia, unspecified: Secondary | ICD-10-CM | POA: Diagnosis not present

## 2024-04-10 DIAGNOSIS — I493 Ventricular premature depolarization: Secondary | ICD-10-CM | POA: Diagnosis not present

## 2024-04-10 DIAGNOSIS — I440 Atrioventricular block, first degree: Secondary | ICD-10-CM | POA: Diagnosis present

## 2024-04-10 DIAGNOSIS — Z6837 Body mass index (BMI) 37.0-37.9, adult: Secondary | ICD-10-CM | POA: Insufficient documentation

## 2024-04-10 DIAGNOSIS — E0590 Thyrotoxicosis, unspecified without thyrotoxic crisis or storm: Secondary | ICD-10-CM | POA: Diagnosis present

## 2024-04-10 DIAGNOSIS — I5033 Acute on chronic diastolic (congestive) heart failure: Principal | ICD-10-CM | POA: Diagnosis present

## 2024-04-10 DIAGNOSIS — Z853 Personal history of malignant neoplasm of breast: Secondary | ICD-10-CM

## 2024-04-10 DIAGNOSIS — I2510 Atherosclerotic heart disease of native coronary artery without angina pectoris: Secondary | ICD-10-CM | POA: Diagnosis present

## 2024-04-10 DIAGNOSIS — Z951 Presence of aortocoronary bypass graft: Secondary | ICD-10-CM | POA: Diagnosis not present

## 2024-04-10 DIAGNOSIS — Z794 Long term (current) use of insulin: Secondary | ICD-10-CM | POA: Insufficient documentation

## 2024-04-10 DIAGNOSIS — E66812 Obesity, class 2: Secondary | ICD-10-CM | POA: Diagnosis not present

## 2024-04-10 DIAGNOSIS — Z79899 Other long term (current) drug therapy: Secondary | ICD-10-CM | POA: Diagnosis not present

## 2024-04-10 DIAGNOSIS — R2689 Other abnormalities of gait and mobility: Secondary | ICD-10-CM | POA: Diagnosis not present

## 2024-04-10 DIAGNOSIS — I110 Hypertensive heart disease with heart failure: Secondary | ICD-10-CM | POA: Insufficient documentation

## 2024-04-10 DIAGNOSIS — E1165 Type 2 diabetes mellitus with hyperglycemia: Secondary | ICD-10-CM | POA: Diagnosis present

## 2024-04-10 DIAGNOSIS — R0602 Shortness of breath: Secondary | ICD-10-CM | POA: Diagnosis present

## 2024-04-10 DIAGNOSIS — Z7984 Long term (current) use of oral hypoglycemic drugs: Secondary | ICD-10-CM | POA: Insufficient documentation

## 2024-04-10 DIAGNOSIS — I509 Heart failure, unspecified: Principal | ICD-10-CM

## 2024-04-10 DIAGNOSIS — E8882 Obesity due to disruption of MC4R pathway: Secondary | ICD-10-CM

## 2024-04-10 DIAGNOSIS — Z7982 Long term (current) use of aspirin: Secondary | ICD-10-CM | POA: Insufficient documentation

## 2024-04-10 DIAGNOSIS — I272 Pulmonary hypertension, unspecified: Secondary | ICD-10-CM | POA: Diagnosis not present

## 2024-04-10 DIAGNOSIS — I10 Essential (primary) hypertension: Secondary | ICD-10-CM | POA: Diagnosis present

## 2024-04-10 LAB — TROPONIN T, HIGH SENSITIVITY
Troponin T High Sensitivity: <15 ng/L
Troponin T High Sensitivity: 16 ng/L (ref <19)

## 2024-04-10 LAB — URINALYSIS, ROUTINE W REFLEX MICROSCOPIC
Bilirubin Urine: NEGATIVE
Glucose, UA: NEGATIVE mg/dL
Hgb urine dipstick: NEGATIVE
Ketones, ur: NEGATIVE mg/dL
Leukocytes,Ua: NEGATIVE
Nitrite: NEGATIVE
Protein, ur: NEGATIVE mg/dL
Specific Gravity, Urine: 1.015 (ref 1.005–1.030)
pH: 6.5 (ref 5.0–8.0)

## 2024-04-10 LAB — CBC WITH DIFFERENTIAL/PLATELET
Abs Immature Granulocytes: 0.01 10*3/uL (ref 0.00–0.07)
Basophils Absolute: 0 10*3/uL (ref 0.0–0.1)
Basophils Relative: 0 %
Eosinophils Absolute: 0.2 10*3/uL (ref 0.0–0.5)
Eosinophils Relative: 5 %
HCT: 39.5 % (ref 36.0–46.0)
Hemoglobin: 13.2 g/dL (ref 12.0–15.0)
Immature Granulocytes: 0 %
Lymphocytes Relative: 32 %
Lymphs Abs: 1.5 10*3/uL (ref 0.7–4.0)
MCH: 30.8 pg (ref 26.0–34.0)
MCHC: 33.4 g/dL (ref 30.0–36.0)
MCV: 92.1 fL (ref 80.0–100.0)
Monocytes Absolute: 0.5 10*3/uL (ref 0.1–1.0)
Monocytes Relative: 10 %
Neutro Abs: 2.5 10*3/uL (ref 1.7–7.7)
Neutrophils Relative %: 53 %
Platelets: 256 10*3/uL (ref 150–400)
RBC: 4.29 MIL/uL (ref 3.87–5.11)
RDW: 13.1 % (ref 11.5–15.5)
WBC: 4.6 10*3/uL (ref 4.0–10.5)
nRBC: 0 % (ref 0.0–0.2)

## 2024-04-10 LAB — COMPREHENSIVE METABOLIC PANEL WITH GFR
ALT: 36 U/L (ref 0–44)
AST: 28 U/L (ref 15–41)
Albumin: 4.2 g/dL (ref 3.5–5.0)
Alkaline Phosphatase: 141 U/L — ABNORMAL HIGH (ref 38–126)
Anion gap: 12 (ref 5–15)
BUN: 12 mg/dL (ref 8–23)
CO2: 24 mmol/L (ref 22–32)
Calcium: 9.9 mg/dL (ref 8.9–10.3)
Chloride: 106 mmol/L (ref 98–111)
Creatinine, Ser: 0.76 mg/dL (ref 0.44–1.00)
GFR, Estimated: >60 mL/min
Glucose, Bld: 130 mg/dL — ABNORMAL HIGH (ref 70–99)
Potassium: 3.7 mmol/L (ref 3.5–5.1)
Sodium: 142 mmol/L (ref 135–145)
Total Bilirubin: 0.6 mg/dL (ref 0.0–1.2)
Total Protein: 7.3 g/dL (ref 6.5–8.1)

## 2024-04-10 LAB — GLUCOSE, CAPILLARY
Glucose-Capillary: 95 mg/dL (ref 70–99)
Glucose-Capillary: 128 mg/dL — ABNORMAL HIGH (ref 70–99)

## 2024-04-10 LAB — PRO BRAIN NATRIURETIC PEPTIDE: Pro Brain Natriuretic Peptide: 1502 pg/mL — ABNORMAL HIGH

## 2024-04-10 LAB — D-DIMER, QUANTITATIVE: D-Dimer, Quant: 0.8 ug{FEU}/mL — ABNORMAL HIGH (ref 0.00–0.50)

## 2024-04-10 LAB — TSH: TSH: <0.01 u[IU]/mL — ABNORMAL LOW (ref 0.350–4.500)

## 2024-04-10 LAB — HEMOGLOBIN A1C
Hgb A1c MFr Bld: 5.7 % — ABNORMAL HIGH (ref 4.8–5.6)
Mean Plasma Glucose: 116.89 mg/dL

## 2024-04-10 MED ORDER — METOPROLOL TARTRATE 5 MG/5ML IV SOLN: 5.0000 mg | Freq: Four times a day (QID) | INTRAVENOUS | Status: DC | PRN

## 2024-04-10 MED ORDER — METOPROLOL TARTRATE 25 MG PO TABS
25.0000 mg | ORAL_TABLET | Freq: Two times a day (BID) | ORAL | Status: DC
  Administered 2024-04-10 – 2024-04-12 (×4): 25 mg via ORAL
  Filled 2024-04-10 (×4): qty 1

## 2024-04-10 MED ORDER — FUROSEMIDE 20 MG PO TABS: 20.0000 mg | ORAL_TABLET | Freq: Every day | ORAL | Status: DC | PRN

## 2024-04-10 MED ORDER — ROSUVASTATIN CALCIUM 20 MG PO TABS
20.0000 mg | ORAL_TABLET | Freq: Every day | ORAL | Status: DC
  Administered 2024-04-10 – 2024-04-12 (×3): 20 mg via ORAL
  Filled 2024-04-10 (×3): qty 1

## 2024-04-10 MED ORDER — IOHEXOL 350 MG/ML SOLN
75.0000 mL | Freq: Once | INTRAVENOUS | Status: AC | PRN
  Administered 2024-04-10: 75 mL via INTRAVENOUS

## 2024-04-10 MED ORDER — LISINOPRIL 20 MG PO TABS: 20.0000 mg | ORAL_TABLET | Freq: Every day | ORAL | Status: DC

## 2024-04-10 MED ORDER — ACETAMINOPHEN 325 MG PO TABS: 650.0000 mg | ORAL_TABLET | Freq: Four times a day (QID) | ORAL | Status: DC | PRN

## 2024-04-10 MED ORDER — INSULIN ASPART 100 UNIT/ML IJ SOLN: 0.0000 [IU] | Freq: Three times a day (TID) | INTRAMUSCULAR | Status: DC

## 2024-04-10 MED ORDER — FUROSEMIDE 10 MG/ML IJ SOLN
60.0000 mg | Freq: Once | INTRAMUSCULAR | Status: AC
  Administered 2024-04-10: 60 mg via INTRAVENOUS
  Filled 2024-04-10: qty 6

## 2024-04-10 MED ORDER — EMPAGLIFLOZIN 10 MG PO TABS: 10.0000 mg | ORAL_TABLET | Freq: Every day | ORAL | Status: DC

## 2024-04-10 MED ORDER — ASPIRIN 81 MG PO TBEC
81.0000 mg | DELAYED_RELEASE_TABLET | Freq: Every day | ORAL | Status: DC
  Administered 2024-04-11 – 2024-04-12 (×2): 81 mg via ORAL
  Filled 2024-04-10 (×2): qty 1

## 2024-04-10 MED ORDER — FENTANYL CITRATE PF 50 MCG/ML IJ SOSY: 12.5000 ug | PREFILLED_SYRINGE | INTRAMUSCULAR | Status: DC | PRN

## 2024-04-10 MED ORDER — POLYETHYLENE GLYCOL 3350 17 G PO PACK: 17.0000 g | PACK | Freq: Every day | ORAL | Status: DC | PRN

## 2024-04-10 MED ORDER — AMLODIPINE BESYLATE 5 MG PO TABS
10.0000 mg | ORAL_TABLET | Freq: Every day | ORAL | Status: DC
  Administered 2024-04-10 – 2024-04-12 (×3): 10 mg via ORAL
  Filled 2024-04-10 (×3): qty 2

## 2024-04-10 MED ORDER — ACETAMINOPHEN 650 MG RE SUPP: 650.0000 mg | Freq: Four times a day (QID) | RECTAL | Status: DC | PRN

## 2024-04-10 MED ORDER — ENOXAPARIN SODIUM 60 MG/0.6ML IJ SOSY
50.0000 mg | PREFILLED_SYRINGE | INTRAMUSCULAR | Status: DC
Start: 2024-04-10 — End: 2024-04-12
  Administered 2024-04-10 – 2024-04-11 (×2): 50 mg via SUBCUTANEOUS
  Filled 2024-04-10 (×2): qty 0.6

## 2024-04-10 NOTE — Assessment & Plan Note (Signed)
-   Continue amlodipine, lisinopril, metoprolol 

## 2024-04-10 NOTE — Assessment & Plan Note (Signed)
 Negative troponins and EKG.

## 2024-04-10 NOTE — ED Notes (Signed)
 Attempted to call report 2x. Unable to get in touch with staff on unit

## 2024-04-10 NOTE — H&P (Signed)
 History and Physical    Patient: Katherine Pratt:[id number] DOB: 22-Apr-1951 DOA: 04/10/2024 DOS: the patient was seen and examined on 04/10/2024 PCP: Chancey Combe, [place]  E, PA-C  Patient coming from: Home  Chief Complaint:  Chief Complaint  Patient presents with   Fatigue   Shortness of Breath   HPI: Katherine Pratt is a 73 y.o. female with medical history significant of HTN, T2DM, CAD status post CABG x 4 in 2023, remote history of breast cancer 14 years ago.  Diastolic congestive heart failure, obesity who comes to the ER today with increasing chest discomfort.  She reports pain in her left arm, shortness of breath, generalized weakness.  Patient reports some increased swelling in her lower extremities.  The patient is supposed to be on Lasix  daily though it is not clear if she takes this every day.  Patient was noted to have an elevated BNP and thought to be in congestive heart failure by the ED.  Patient had a recent echo on 04/04/2024 that revealed severe pulmonary hypertension, EF of 55 to 60%, moderate LVH, mild aortic stenosis.  She was referred to the advanced heart care team but has not gotten this appointment yet.  Review of Systems: As mentioned in the history of present illness. All other systems reviewed and are negative. Past Medical History:  Diagnosis Date   Aortic atherosclerosis (HCC) 01/09/2022   Chest CTA 12/09/2021: Aortic atherosclerosis   Breast cancer (HCC)    CAD (coronary artery disease) 01/09/2022   NSTEMI s/p CABG in 12/2021 (LIMA-LAD, S-OM1, S-OM2, S-PDA; endoscopic vein harvest from right leg) Pre-CABG Dopplers 12/12/2021: Bilateral ICA 1-39 Echocardiogram 12/11/2021: EF 55-60, no RWMA, mild LVH, normal RVSF, normal PASP, trivial MR   Diabetes mellitus without complication (HCC)    HLD (hyperlipidemia) 08/01/2015   Hypertension    Past Surgical History:  Procedure Laterality Date   ABDOMINAL HYSTERECTOMY     APPENDECTOMY     BREAST LUMPECTOMY     CORONARY ARTERY BYPASS  GRAFT N/A 12/13/2021   Procedure: CORONARY ARTERY BYPASS GRAFTING (CABG) X  4  , ON PUMP, USING LEFT INTERNAL MAMMARY ARTERY AND RIGHT ENDOSCOPIC GREATER SAPHENOUS VEIN CONDUITS;  Surgeon: Bartley Lightning, MD;  Location: [facility] OR;  Service: Open Heart Surgery;  Laterality: N/A;   ELBOW SURGERY     ENDOVEIN HARVEST OF GREATER SAPHENOUS VEIN Right 12/13/2021   Procedure: ENDOVEIN HARVEST OF GREATER SAPHENOUS VEIN;  Surgeon: Bartley Lightning, MD;  Location: [facility] OR;  Service: Open Heart Surgery;  Laterality: Right;   HERNIA REPAIR     KNEE SURGERY     LEFT HEART CATH AND CORONARY ANGIOGRAPHY N/A 12/11/2021   Procedure: LEFT HEART CATH AND CORONARY ANGIOGRAPHY;  Surgeon: Millicent Ally, MD;  Location: [facility] INVASIVE CV LAB;  Service: Cardiovascular;  Laterality: N/A;   TEE WITHOUT CARDIOVERSION N/A 12/13/2021   Procedure: TRANSESOPHAGEAL ECHOCARDIOGRAM (TEE);  Surgeon: Bartley Lightning, MD;  Location: [facility] OR;  Service: Open Heart Surgery;  Laterality: N/A;   Social History:  reports that she has never smoked. She has never used smokeless tobacco. She reports that she does not drink alcohol  and does not use drugs.  Allergies  Allergen Reactions   Aspirin  Other (See Comments)    On meloxicam   Keflex  [Cephalexin ] Hives   Losartan Rash    History reviewed. No pertinent family history.  Prior to Admission medications   Medication Sig Start Date End Date Taking? Authorizing Provider  amLODipine  (NORVASC ) 10 MG tablet Take 10  mg by mouth daily. 03/16/17   [provider]  aspirin  EC 81 MG EC tablet Take 1 tablet (81 mg total) by mouth daily. Swallow whole. 12/20/21   Gold, Wayne E, PA-C  Blood Pressure Monitoring (BLOOD PRESSURE CUFF) MISC 1 each by Does not apply route daily. 03/02/24   Gerald Kitty., NP  empagliflozin  (JARDIANCE ) 10 MG TABS tablet Take 1 tablet (10 mg total) by mouth daily. 02/23/23   Odie Benne, MD  EPINEPHrine  0.3 mg/0.3 mL IJ SOAJ injection Inject 0.3 mg into the muscle  as needed for anaphylaxis. 01/04/15   [provider]  furosemide  (LASIX ) 20 MG tablet TAKE 1 TABLET BY MOUTH EVERY DAY 03/25/24   Dick, Ernest H Jr., NP  lisinopril  (ZESTRIL ) 20 MG tablet TAKE 1 TABLET BY MOUTH EVERY DAY 12/04/23   Odie Benne, MD  meclizine  (ANTIVERT ) 12.5 MG tablet Take 1 tablet (12.5 mg total) by mouth 2 (two) times daily as needed for dizziness. 02/04/23   Dalene Duck, MD  metoprolol  tartrate (LOPRESSOR ) 25 MG tablet Take 1 tab in the morning and 2 tabs in the evening 03/02/24   Dick, Ernest H Jr., NP  polyvinyl alcohol  (LIQUIFILM TEARS) 1.4 % ophthalmic solution Place 1 drop into both eyes as needed for dry eyes.    [provider]  potassium chloride  SA (KLOR-CON  M) 20 MEQ tablet TAKE 1 TABLET BY MOUTH EVERY DAY 03/25/24   Gerald Kitty., NP  rosuvastatin  (CRESTOR ) 20 MG tablet Take 1 tablet (20 mg total) by mouth daily. 03/10/24   Gerald Kitty., NP  TRULICITY 3 MG/0.5ML SOPN Inject 3 mg into the skin once a week. 10/30/21   [provider]    Physical Exam: Vitals:   04/10/24 1045 04/10/24 1200 04/10/24 1400 04/10/24 1641  BP:  (!) 153/62 (!) 170/70 126/84  Pulse: 62 65 68 71  Resp: 15 (!) 25 18 20   Temp:   98.3 F (36.8 C) 98.2 F (36.8 C)  TempSrc:   Oral Oral  SpO2: 98% 99% 97% 100%  Weight:       Physical Examination: General appearance - alert, well appearing, and in no distress and overweight Neck - supple, no significant adenopathy Chest - clear to auscultation, no wheezes, rales or rhonchi, symmetric air entry Heart - normal rate, regular rhythm, normal S1, S2, no murmurs, rubs, clicks or gallops Abdomen - soft, nontender, nondistended, no masses or organomegaly Extremities - pedal edema 1+  Data Reviewed: Results for orders placed or performed during the hospital encounter of 04/10/24 (from the past 24 hours)  CBC with Differential     Status: None   Collection Time: 04/10/24  9:53 AM  Result Value  Ref Range   WBC 4.6 4.0 - 10.5 K/uL   RBC 4.29 3.87 - 5.11 MIL/uL   Hemoglobin 13.2 12.0 - 15.0 g/dL   HCT 52.8 41.3 - 24.4 %   MCV 92.1 80.0 - 100.0 fL   MCH 30.8 26.0 - 34.0 pg   MCHC 33.4 30.0 - 36.0 g/dL   RDW 01.0 27.2 - 53.6 %   Platelets 256 150 - 400 K/uL   nRBC 0.0 0.0 - 0.2 %   Neutrophils Relative % 53 %   Neutro Abs 2.5 1.7 - 7.7 K/uL   Lymphocytes Relative 32 %   Lymphs Abs 1.5 0.7 - 4.0 K/uL   Monocytes Relative 10 %   Monocytes Absolute 0.5 0.1 - 1.0 K/uL  Eosinophils Relative 5 %   Eosinophils Absolute 0.2 0.0 - 0.5 K/uL   Basophils Relative 0 %   Basophils Absolute 0.0 0.0 - 0.1 K/uL   Immature Granulocytes 0 %   Abs Immature Granulocytes 0.01 0.00 - 0.07 K/uL  Comprehensive metabolic panel     Status: Abnormal   Collection Time: 04/10/24  9:53 AM  Result Value Ref Range   Sodium 142 135 - 145 mmol/L   Potassium 3.7 3.5 - 5.1 mmol/L   Chloride 106 98 - 111 mmol/L   CO2 24 22 - 32 mmol/L   Glucose, Bld 130 (H) 70 - 99 mg/dL   BUN 12 8 - 23 mg/dL   Creatinine, Ser 4.09 0.44 - 1.00 mg/dL   Calcium  9.9 8.9 - 10.3 mg/dL   Total Protein 7.3 6.5 - 8.1 g/dL   Albumin  4.2 3.5 - 5.0 g/dL   AST 28 15 - 41 U/L   ALT 36 0 - 44 U/L   Alkaline Phosphatase 141 (H) 38 - 126 U/L   Total Bilirubin 0.6 0.0 - 1.2 mg/dL   GFR, Estimated >81 >19 mL/min   Anion gap 12 5 - 15  Pro Brain natriuretic peptide     Status: Abnormal   Collection Time: 04/10/24  9:53 AM  Result Value Ref Range   Pro Brain Natriuretic Peptide 1,502.0 (H) <300.0 pg/mL  Troponin T, High Sensitivity     Status: None   Collection Time: 04/10/24  9:53 AM  Result Value Ref Range   Troponin T High Sensitivity <15 <19 ng/L  D-dimer, quantitative     Status: Abnormal   Collection Time: 04/10/24  9:53 AM  Result Value Ref Range   D-Dimer, Quant 0.80 (H) 0.00 - 0.50 ug/mL-FEU  Urinalysis, Routine w reflex microscopic -Urine, Clean Catch     Status: None   Collection Time: 04/10/24 11:27 AM  Result Value  Ref Range   Color, Urine YELLOW YELLOW   APPearance CLEAR CLEAR   Specific Gravity, Urine 1.015 1.005 - 1.030   pH 6.5 5.0 - 8.0   Glucose, UA NEGATIVE NEGATIVE mg/dL   Hgb urine dipstick NEGATIVE NEGATIVE   Bilirubin Urine NEGATIVE NEGATIVE   Ketones, ur NEGATIVE NEGATIVE mg/dL   Protein, ur NEGATIVE NEGATIVE mg/dL   Nitrite NEGATIVE NEGATIVE   Leukocytes,Ua NEGATIVE NEGATIVE  Troponin T, High Sensitivity     Status: None   Collection Time: 04/10/24 11:41 AM  Result Value Ref Range   Troponin T High Sensitivity 16 <19 ng/L   CT Angio Chest PE W and/or Wo Contrast Result Date: 04/10/2024 CLINICAL DATA:  Fatigue.  SOB. EXAM: CT ANGIOGRAPHY CHEST WITH CONTRAST TECHNIQUE: Multidetector CT imaging of the chest was performed using the standard protocol during bolus administration of intravenous contrast. Multiplanar CT image reconstructions and MIPs were obtained to evaluate the vascular anatomy. RADIATION DOSE REDUCTION: This exam was performed according to the departmental dose-optimization program which includes automated exposure control, adjustment of the mA and/or kV according to patient size and/or use of iterative reconstruction technique. CONTRAST:  75mL OMNIPAQUE  IOHEXOL  350 MG/ML SOLN COMPARISON:  12/09/2021. FINDINGS: Cardiovascular: Cardiomegaly. No pericardial effusion. No filling defects in pulmonary is to suggest PE. No aortic aneurysm or evidence of dissection. There are atheromatous calcifications of aorta and coronary arteries. Status post median sternotomy and CABG. Mediastinum/Nodes: No enlarged mediastinal, hilar, or axillary lymph nodes. Thyroid gland, trachea, and esophagus demonstrate no significant findings. Lungs/Pleura: Minimal dependent subsegmental atelectasis. Lungs are otherwise clear. No pleural effusion  or pneumothorax. Upper Abdomen: No acute abnormality. Musculoskeletal: No chest wall abnormality. No acute or significant osseous findings. Review of the MIP images  confirms the above findings. IMPRESSION: 1. No evidence of PE or thoracic aortic dissection. 2. Cardiomegaly. 3. Aortic and coronary artery atherosclerosis (ICD10-I70.0). Electronically Signed   By: Sydell Eva M.D.   On: 04/10/2024 13:54   DG Chest Portable 1 View Result Date: 04/10/2024 CLINICAL DATA:  fatigue EXAM: PORTABLE CHEST - 1 VIEW COMPARISON:  01/15/2022. FINDINGS: Cardiac silhouette is prominent. There is pulmonary interstitial prominence with vascular congestion. No focal consolidation. No pneumothorax or pleural effusion identified. Aorta is calcified. Status post median sternotomy and CABG. IMPRESSION: Findings suggest CHF. Electronically Signed   By: Sydell Eva M.D.   On: 04/10/2024 10:25   ECHOCARDIOGRAM COMPLETE Result Date: 04/04/2024    ECHOCARDIOGRAM REPORT   Patient Name:   MARKEA HAPP  Date of Exam: 04/04/2024 Medical Rec #:  [id number]     Height:       65.0 in Accession #:    [id number]    Weight:       232.2 lb Date of Birth:  03-18-1951    BSA:          2.108 m Patient Age:    72 years      BP:           122/68 mmHg Patient Gender: F             HR:           65 bpm. Exam Location:  [address] Procedure: 2D Echo, Cardiac Doppler and Color Doppler (Both Spectral and Color            Flow Doppler were utilized during procedure). Indications:    R06.00 SOB  History:        Patient has prior history of Echocardiogram examinations, most                 recent 12/11/2021. CAD, Arrythmias:PVC, Signs/Symptoms:Shortness                 of Breath; Risk Factors:Obesity, Hypertension, Diabetes and                 Dyslipidemia.  Sonographer:    Lula Sale RDCS Referring Phys: [phone] Cathaleen Clinton, JR DICK IMPRESSIONS  1. Left ventricular ejection fraction, by estimation, is 55 to 60%. The left ventricle has normal function. The left ventricle has no regional wall motion abnormalities. There is moderate left ventricular hypertrophy. Left ventricular diastolic parameters are  indeterminate.  2. Right ventricular systolic function is moderately reduced. The right ventricular size is normal. There is severely elevated pulmonary artery systolic pressure. The estimated right ventricular systolic pressure is 62.3 mmHg.  3. Left atrial size was moderately dilated.  4. The mitral valve is normal in structure. Trivial mitral valve regurgitation. No evidence of mitral stenosis.  5. The aortic valve has an indeterminant number of cusps. There is moderate calcification of the aortic valve. There is moderate thickening of the aortic valve. Aortic valve regurgitation is not visualized. Mild aortic valve stenosis.  6. The inferior vena cava is dilated in size with <50% respiratory variability, suggesting right atrial pressure of 15 mmHg. Comparison(s): Changes from prior study are noted. Severely elevated pulmonary now present, was normal in 2023. FINDINGS  Left Ventricle: Left ventricular ejection fraction, by estimation, is 55 to 60%. The left ventricle has normal function. The left ventricle has no regional wall  motion abnormalities. The left ventricular internal cavity size was normal in size. There is  moderate left ventricular hypertrophy. Left ventricular diastolic parameters are indeterminate. Right Ventricle: The right ventricular size is normal. Right vetricular wall thickness was not well visualized. Right ventricular systolic function is moderately reduced. There is severely elevated pulmonary artery systolic pressure. The tricuspid regurgitant velocity is 3.44 m/s, and with an assumed right atrial pressure of 15 mmHg, the estimated right ventricular systolic pressure is 62.3 mmHg. Left Atrium: Left atrial size was moderately dilated. Right Atrium: Right atrial size was normal in size. Pericardium: There is no evidence of pericardial effusion. Mitral Valve: The mitral valve is normal in structure. Trivial mitral valve regurgitation. No evidence of mitral valve stenosis. Tricuspid Valve:  The tricuspid valve is normal in structure. Tricuspid valve regurgitation is mild . No evidence of tricuspid stenosis. Aortic Valve: The aortic valve has an indeterminant number of cusps. There is moderate calcification of the aortic valve. There is moderate thickening of the aortic valve. Aortic valve regurgitation is not visualized. Mild aortic stenosis is present. Aortic valve mean gradient measures 10.0 mmHg. Aortic valve peak gradient measures 19.6 mmHg. Aortic valve area, by VTI measures 1.40 cm. Pulmonic Valve: The pulmonic valve was not well visualized. Pulmonic valve regurgitation is trivial. No evidence of pulmonic stenosis. Aorta: The aortic root, ascending aorta, aortic arch and descending aorta are all structurally normal, with no evidence of dilitation or obstruction. Venous: The inferior vena cava is dilated in size with less than 50% respiratory variability, suggesting right atrial pressure of 15 mmHg. IAS/Shunts: The atrial septum is grossly normal.  LEFT VENTRICLE PLAX 2D LVIDd:         4.75 cm   Diastology LVIDs:         3.30 cm   LV e' medial:    8.00 cm/s LV PW:         1.20 cm   LV E/e' medial:  22.0 LV IVS:        1.40 cm   LV e' lateral:   15.00 cm/s LVOT diam:     1.90 cm   LV E/e' lateral: 11.7 LV SV:         71 LV SV Index:   33 LVOT Area:     2.84 cm  RIGHT VENTRICLE            IVC RV S prime:     7.72 cm/s  IVC diam: 2.30 cm TAPSE (M-mode): 1.0 cm RVSP:           50.3 mmHg LEFT ATRIUM             Index        RIGHT ATRIUM           Index LA diam:        4.90 cm 2.32 cm/m   RA Pressure: 3.00 mmHg LA Vol (A2C):   73.8 ml 35.01 ml/m  RA Area:     19.80 cm LA Vol (A4C):   67.3 ml 31.93 ml/m  RA Volume:   54.90 ml  26.05 ml/m LA Biplane Vol: 72.2 ml 34.26 ml/m  AORTIC VALVE AV Area (Vmax):    1.37 cm AV Area (Vmean):   1.47 cm AV Area (VTI):     1.40 cm AV Vmax:           221.50 cm/s AV Vmean:          145.500 cm/s AV VTI:  0.504 m AV Peak Grad:      19.6 mmHg AV Mean  Grad:      10.0 mmHg LVOT Vmax:         107.00 cm/s LVOT Vmean:        75.500 cm/s LVOT VTI:          0.249 m LVOT/AV VTI ratio: 0.49  AORTA Ao Root diam: 2.80 cm Ao Asc diam:  2.90 cm MITRAL VALVE                TRICUSPID VALVE MV Area (PHT): 4.74 cm     TR Peak grad:   47.3 mmHg MV Decel Time: 160 msec     TR Vmax:        344.00 cm/s MV E velocity: 176.00 cm/s  Estimated RAP:  3.00 mmHg MV A velocity: 102.00 cm/s  RVSP:           50.3 mmHg MV E/A ratio:  1.73                             SHUNTS                             Systemic VTI:  0.25 m                             Systemic Diam: 1.90 cm Sheryle Donning MD Electronically signed by Sheryle Donning MD Signature Date/Time: 04/04/2024/4:54:52 PM    Final    EKG: Sinus, bigeminy, prolonged PR interval  Assessment and Plan: * Acute on chronic diastolic congestive heart failure (HCC) Recent echo approximately 6 days ago.  That showed new pulmonary hypertension.  EF was 55 to 60%, moderate LVH, mild aortic stenosis. She has been referred to advanced heart care clinic.  History of malignant neoplasm of female breast Status postlumpectomy with XRT approximately 14 years ago.  Recent normal mammogram per patient.  First degree AV block Has been present for a while.  PVC's (premature ventricular contractions) Continue beta-blocker  S/P CABG x 4 Negative troponins and EKG.  Uncontrolled type 2 diabetes mellitus with hyperglycemia, without long-term current use of insulin  (HCC) Continue Jardiance  Sliding scale insulin  Check A1c On Trulicity home per med rec.  Held this here.  HLD (hyperlipidemia) Continue rosuvastatin   Benign essential hypertension Continue amlodipine , lisinopril , metoprolol       Advance Care Planning:   Code Status: Prior full  Consults: None  Family Communication: Patient at bedside Patient currently lives with her granddaughter.  Her daughter is also around to help. Aaron Aas DVT prophylaxis:  Lovenox   Severity of Illness: The appropriate patient status for this patient is OBSERVATION. Observation status is judged to be reasonable and necessary in order to provide the required intensity of service to ensure the patient's safety. The patient's presenting symptoms, physical exam findings, and initial radiographic and laboratory data in the context of their medical condition is felt to place them at decreased risk for further clinical deterioration. Furthermore, it is anticipated that the patient will be medically stable for discharge from the hospital within 2 midnights of admission.   Author: Granville Layer, MD 04/10/2024 5:01 PM  For on call review [email or web address].

## 2024-04-10 NOTE — Assessment & Plan Note (Signed)
 Continue Jardiance  Sliding scale insulin  Check A1c On Trulicity home per med rec.  Held this here.

## 2024-04-10 NOTE — Progress Notes (Signed)
 Plan of Care Note for accepted transfer   Patient: Katherine Pratt MRN: [id number]   DOA: 04/10/2024  Facility requesting transfer: [facility].  Requesting Provider: Lowery Rue, DO. Reason for transfer: Acute on chronic diastolic CHF. Facility course:  73 year old female with a past medical history pulmonary hypertension, diastolic CHF who presented with fatigue, night sweats, left arm pain since yesterday, dyspnea on exertion and lower extremity edema.  Patient has received furosemide  20 g IVP x 1.  04/04/2024 echocardiogram report. IMPRESSIONS:   1. Left ventricular ejection fraction, by estimation, is 55 to 60%. The left ventricle has normal function. The left ventricle has no regional wall motion abnormalities. There is moderate left ventricular hypertrophy. Left ventricular diastolic parameters are indeterminate.  2. Right ventricular systolic function is moderately reduced. The right ventricular size is normal. There is severely elevated pulmonary artery systolic pressure. The estimated right ventricular systolic pressure is 62.3 mmHg.  3. Left atrial size was moderately dilated.  4. The mitral valve is normal in structure. Trivial mitral valve regurgitation. No evidence of mitral stenosis.  5. The aortic valve has an indeterminant number of cusps. There is moderate calcification of the aortic valve. There is moderate thickening of the aortic valve. Aortic valve regurgitation is not visualized. Mild aortic valve stenosis.  6. The inferior vena cava is dilated in size with <50% respiratory variability, suggesting right atrial pressure of 15 mmHg.  Comparison(s): Changes from prior study are noted. Severely elevated pulmonary now present, was normal in 2023.   Plan of care: The patient is accepted for admission to Telemetry unit, at [facility].  Author: Danice Dural, MD 04/10/2024  Check [email or web address] for on-call coverage.  Nursing staff, Please  call [facility] Admits & Consults System-Wide number on Amion as soon as patient's arrival, so appropriate admitting provider can evaluate the pt.

## 2024-04-10 NOTE — ED Triage Notes (Signed)
 Pt reports decrease energy and fatigue X 3 weeks. Also concerned due to increase sweating at nigh. Left arm pain since yesterday. Shortness of breath with exertion.Swelling lower extrmities

## 2024-04-10 NOTE — Assessment & Plan Note (Signed)
 Continue rosuvastatin.

## 2024-04-10 NOTE — Assessment & Plan Note (Signed)
 Recent echo approximately 6 days ago.  That showed new pulmonary hypertension.  EF was 55 to 60%, moderate LVH, mild aortic stenosis. She has been referred to advanced heart care clinic.

## 2024-04-10 NOTE — ED Notes (Signed)
 Pure wick placed due to administration of IV Lasix 

## 2024-04-10 NOTE — Assessment & Plan Note (Signed)
 Status postlumpectomy with XRT approximately 14 years ago.  Recent normal mammogram per patient.

## 2024-04-10 NOTE — Hospital Course (Signed)
 Patient is a 73 year old with history of HTN, T2DM, CAD status post CABG x 4 in 2023, remote history of breast cancer 14 years ago.  Diastolic congestive heart failure, obesity who comes to the ER today with increasing chest discomfort.  She reports pain in her left arm, shortness of breath, generalized weakness.  Patient reports some increased swelling in her lower extremities.  The patient is supposed to be on Lasix  daily though it is not clear if she takes this every day.  Patient was noted to have an elevated BNP and thought to be in congestive heart failure by the ED.  Patient had a recent echo on 04/04/2024 that revealed severe pulmonary hypertension, EF of 55 to 60%, moderate LVH, mild aortic stenosis.  She was referred to the advanced heart care team but has not gotten this appointment yet.

## 2024-04-10 NOTE — Assessment & Plan Note (Signed)
 Has been present for a while.

## 2024-04-10 NOTE — ED Notes (Signed)
 Attempted to call report. Was told nurse was in contact room and to call back. I sent a secured chat to receiving nurse at [facility].

## 2024-04-10 NOTE — Assessment & Plan Note (Signed)
 Continue beta blocker.

## 2024-04-10 NOTE — ED Provider Notes (Signed)
 [place] [facility] AT [facility] Provider Note   CSN: [id number] Arrival date & time: 04/10/24  4098     History  Chief Complaint  Patient presents with   Fatigue   Shortness of Breath    Katherine Pratt is a 73 y.o. female.  Patient here with general fatigue shortness of breath with exertion the last couple weeks.  Some left arm discomfort yesterday.  Increased welling to her lower legs.  History of hypertension diabetes CAD breast cancer.  Takes fluid pill intermittently.  Does not take any blood thinners.  Not having any chest pain.  No cough sputum production fever or chills.  Lives by herself.  The history is provided by the patient.       Home Medications Prior to Admission medications   Medication Sig Start Date End Date Taking? Authorizing Provider  amLODipine  (NORVASC ) 10 MG tablet Take 10 mg by mouth daily. 03/16/17   [provider]  aspirin  EC 81 MG EC tablet Take 1 tablet (81 mg total) by mouth daily. Swallow whole. 12/20/21   Gold, Wayne E, PA-C  Blood Pressure Monitoring (BLOOD PRESSURE CUFF) MISC 1 each by Does not apply route daily. 03/02/24   Gerald Kitty., NP  empagliflozin  (JARDIANCE ) 10 MG TABS tablet Take 1 tablet (10 mg total) by mouth daily. 02/23/23   Odie Benne, MD  EPINEPHrine  0.3 mg/0.3 mL IJ SOAJ injection Inject 0.3 mg into the muscle as needed for anaphylaxis. 01/04/15   [provider]  furosemide  (LASIX ) 20 MG tablet TAKE 1 TABLET BY MOUTH EVERY DAY 03/25/24   Dick, Ernest H Jr., NP  lisinopril  (ZESTRIL ) 20 MG tablet TAKE 1 TABLET BY MOUTH EVERY DAY 12/04/23   Odie Benne, MD  meclizine  (ANTIVERT ) 12.5 MG tablet Take 1 tablet (12.5 mg total) by mouth 2 (two) times daily as needed for dizziness. 02/04/23   Dalene Duck, MD  metoprolol  tartrate (LOPRESSOR ) 25 MG tablet Take 1 tab in the morning and 2 tabs in the evening 03/02/24   Gerald Kitty., NP  polyvinyl alcohol  (LIQUIFILM  TEARS) 1.4 % ophthalmic solution Place 1 drop into both eyes as needed for dry eyes.    [provider]  potassium chloride  SA (KLOR-CON  M) 20 MEQ tablet TAKE 1 TABLET BY MOUTH EVERY DAY 03/25/24   Gerald Kitty., NP  rosuvastatin  (CRESTOR ) 20 MG tablet Take 1 tablet (20 mg total) by mouth daily. 03/10/24   Gerald Kitty., NP  TRULICITY 3 MG/0.5ML SOPN Inject 3 mg into the skin once a week. 10/30/21   [provider]      Allergies    Aspirin , Keflex  [cephalexin ], and Losartan    Review of Systems   Review of Systems  Physical Exam Updated Vital Signs BP (!) 153/62   Pulse 65   Temp 98.3 F (36.8 C)   Resp (!) 25   Wt 102.1 kg   SpO2 99%   BMI 37.44 kg/m  Physical Exam Vitals and nursing note reviewed.  Constitutional:      General: She is not in acute distress.    Appearance: She is well-developed. She is not ill-appearing.  HENT:     Head: Normocephalic and atraumatic.     Nose: Nose normal.     Mouth/Throat:     Mouth: Mucous membranes are moist.  Eyes:     Extraocular Movements: Extraocular movements intact.     Conjunctiva/sclera: Conjunctivae normal.  Pupils: Pupils are equal, round, and reactive to light.  Cardiovascular:     Rate and Rhythm: Normal rate and regular rhythm.     Pulses: Normal pulses.     Heart sounds: Normal heart sounds. No murmur heard. Pulmonary:     Effort: Pulmonary effort is normal. No respiratory distress.     Breath sounds: Normal breath sounds.  Abdominal:     Palpations: Abdomen is soft.     Tenderness: There is no abdominal tenderness.  Musculoskeletal:        General: No swelling.     Cervical back: Normal range of motion and neck supple.     Right lower leg: Edema present.     Left lower leg: Edema present.  Skin:    General: Skin is warm and dry.     Capillary Refill: Capillary refill takes less than 2 seconds.  Neurological:     General: No focal deficit present.     Mental Status: She is alert  and oriented to person, place, and time.     Cranial Nerves: No cranial nerve deficit.     Sensory: No sensory deficit.     Motor: No weakness.     Coordination: Coordination normal.  Psychiatric:        Mood and Affect: Mood normal.     ED Results / Procedures / Treatments   Labs (all labs ordered are listed, but only abnormal results are displayed) Labs Reviewed  COMPREHENSIVE METABOLIC PANEL WITH GFR - Abnormal; Notable for the following components:      Result Value   Glucose, Bld 130 (*)    Alkaline Phosphatase 141 (*)    All other components within normal limits  PRO BRAIN NATRIURETIC PEPTIDE - Abnormal; Notable for the following components:   Pro Brain Natriuretic Peptide 1,502.0 (*)    All other components within normal limits  D-DIMER, QUANTITATIVE - Abnormal; Notable for the following components:   D-Dimer, Quant 0.80 (*)    All other components within normal limits  CBC WITH DIFFERENTIAL/PLATELET  URINALYSIS, ROUTINE W REFLEX MICROSCOPIC  TROPONIN T, HIGH SENSITIVITY  TROPONIN T, HIGH SENSITIVITY    EKG None  Radiology DG Chest Portable 1 View Result Date: 04/10/2024 CLINICAL DATA:  fatigue EXAM: PORTABLE CHEST - 1 VIEW COMPARISON:  01/15/2022. FINDINGS: Cardiac silhouette is prominent. There is pulmonary interstitial prominence with vascular congestion. No focal consolidation. No pneumothorax or pleural effusion identified. Aorta is calcified. Status post median sternotomy and CABG. IMPRESSION: Findings suggest CHF. Electronically Signed   By: Sydell Eva M.D.   On: 04/10/2024 10:25    Procedures Procedures    Medications Ordered in ED Medications  furosemide  (LASIX ) injection 60 mg (60 mg Intravenous Given 04/10/24 1154)  iohexol  (OMNIPAQUE ) 350 MG/ML injection 75 mL (75 mLs Intravenous Contrast Given 04/10/24 1343)    ED Course/ Medical Decision Making/ A&P                                 Medical Decision Making Amount and/or Complexity of Data  Reviewed Labs: ordered. Radiology: ordered.  Risk Prescription drug management.   Katherine Pratt is here for fatigue shortness of breath with exertion.  Unremarkable vitals.  No fever.  EKG shows sinus rhythm with PVCs and prolonged PR similar to prior EKGs.  No ischemic changes.  Differential could be volume overload heart failure, ACS PE infectious process.  I have no concern for stroke  or neurologic process.  Sounds like she has been dealing with general fatigue the last couple weeks shortness of breath with exertion.  She had an echocardiogram last week that shows some pulmonary hypertension and some dilation of her right ventricle.  She has been referred to advanced heart failure clinic for further treatment.  She does look volume overloaded on exam.  Gabapentin edema in her legs.  Will check CBC CMP BNP troponin chest x-ray D-dimer urinalysis.  Chest x-ray also consistent with volume overload from my review and interpretation.  BNP is 1500.  I do think she likely has heart failure.  Given a dose of IV Lasix .  Troponin negative x 2.  No significant leukocytosis anemia or electrolyte abnormality otherwise.  D-dimer was elevated CT scan has been ordered to rule out PE but I do think that this is CHF.  Will admit for further care.  This chart was dictated using voice recognition software.  Despite best efforts to proofread,  errors can occur which can change the documentation meaning.         Final Clinical Impression(s) / ED Diagnoses Final diagnoses:  Acute congestive heart failure, unspecified heart failure type [facility])    Rx / DC Orders ED Discharge Orders     None         Lowery Rue, DO 04/10/24 1344

## 2024-04-11 ENCOUNTER — Other Ambulatory Visit: Payer: Self-pay

## 2024-04-11 DIAGNOSIS — I5031 Acute diastolic (congestive) heart failure: Secondary | ICD-10-CM | POA: Diagnosis not present

## 2024-04-11 DIAGNOSIS — I5033 Acute on chronic diastolic (congestive) heart failure: Secondary | ICD-10-CM | POA: Diagnosis not present

## 2024-04-11 LAB — BASIC METABOLIC PANEL WITH GFR
Anion gap: 8 (ref 5–15)
Anion gap: 9 (ref 5–15)
BUN: 14 mg/dL (ref 8–23)
BUN: 17 mg/dL (ref 8–23)
CO2: 26 mmol/L (ref 22–32)
CO2: 26 mmol/L (ref 22–32)
Calcium: 8.9 mg/dL (ref 8.9–10.3)
Calcium: 9.3 mg/dL (ref 8.9–10.3)
Chloride: 104 mmol/L (ref 98–111)
Chloride: 103 mmol/L (ref 98–111)
Creatinine, Ser: 0.78 mg/dL (ref 0.44–1.00)
Creatinine, Ser: 0.78 mg/dL (ref 0.44–1.00)
GFR, Estimated: >60 mL/min (ref >60)
GFR, Estimated: >60 mL/min (ref >60)
Glucose, Bld: 93 mg/dL (ref 70–99)
Glucose, Bld: 116 mg/dL — ABNORMAL HIGH (ref 70–99)
Potassium: 3.1 mmol/L — ABNORMAL LOW (ref 3.5–5.1)
Potassium: 4.2 mmol/L (ref 3.5–5.1)
Sodium: 138 mmol/L (ref 135–145)
Sodium: 138 mmol/L (ref 135–145)

## 2024-04-11 LAB — GLUCOSE, CAPILLARY
Glucose-Capillary: 95 mg/dL (ref 70–99)
Glucose-Capillary: 96 mg/dL (ref 70–99)
Glucose-Capillary: 93 mg/dL (ref 70–99)
Glucose-Capillary: 108 mg/dL — ABNORMAL HIGH (ref 70–99)

## 2024-04-11 LAB — CBC
HCT: 38.2 % (ref 36.0–46.0)
Hemoglobin: 12.6 g/dL (ref 12.0–15.0)
MCH: 31.1 pg (ref 26.0–34.0)
MCHC: 33 g/dL (ref 30.0–36.0)
MCV: 94.3 fL (ref 80.0–100.0)
Platelets: 229 10*3/uL (ref 150–400)
RBC: 4.05 MIL/uL (ref 3.87–5.11)
RDW: 12.9 % (ref 11.5–15.5)
WBC: 5.3 10*3/uL (ref 4.0–10.5)
nRBC: 0 % (ref 0.0–0.2)

## 2024-04-11 LAB — T4, FREE
Free T4: 1.17 ng/dL — ABNORMAL HIGH (ref 0.61–1.12)
Free T4: 1.24 ng/dL — ABNORMAL HIGH (ref 0.61–1.12)

## 2024-04-11 LAB — TROPONIN I (HIGH SENSITIVITY)
Troponin I (High Sensitivity): 15 ng/L (ref <18)
Troponin I (High Sensitivity): 15 ng/L (ref <18)

## 2024-04-11 LAB — MAGNESIUM: Magnesium: 2.3 mg/dL (ref 1.7–2.4)

## 2024-04-11 MED ORDER — TORSEMIDE 20 MG PO TABS
20.0000 mg | ORAL_TABLET | Freq: Every day | ORAL | Status: DC
  Administered 2024-04-11 – 2024-04-12 (×2): 20 mg via ORAL
  Filled 2024-04-11 (×2): qty 1

## 2024-04-11 MED ORDER — POTASSIUM CHLORIDE 20 MEQ PO PACK
40.0000 meq | PACK | ORAL | Status: AC
Start: 2024-04-11 — End: 2024-04-11
  Administered 2024-04-11 (×2): 40 meq via ORAL
  Filled 2024-04-11 (×2): qty 2

## 2024-04-11 NOTE — Progress Notes (Signed)
 The phlebotomist is at bedside. Blood draw was unsuccessful. The patient has refused second attempt at this time. Pt encouraged to allow secondary phlebotomist attempt.

## 2024-04-11 NOTE — Care Management Obs Status (Signed)
 MEDICARE OBSERVATION STATUS NOTIFICATION   Patient Details  Name: Katherine Pratt MRN: [id number] Date of Birth: [birth year]   Medicare Observation Status Notification Given:  Yes    MahabirThersia Flax, RN 04/11/2024, 1:39 PM

## 2024-04-11 NOTE — Consult Note (Addendum)
 Cardiology Consultation   Patient ID: Katherine Pratt MRN: [id number]; DOB: [birth year]  Admit date: 04/10/2024 Date of Consult: 04/11/2024  PCP:  Manual Self, PA-C   [facility] Providers Cardiologist:  Antoinette Batman, MD        Patient Profile:   Katherine Pratt is a 73 y.o. female with a hx of CAD s/p CABG x4 (LIMA-LAD, SVG-OM1, SVG-OM 2, and SVG-PDA) by Dr. Sherene Dilling 12/2021, HTN, HLD, DM II, morbid obesity and history of right breast cancer s/p lumpectomy who is being seen 04/11/2024 for the evaluation of SOB at the request of Dr. Sandria Cruise.  History of Present Illness:   Katherine Pratt is a pleasant 73 year old female with past medical history of CAD s/p CABG x4 (LIMA-LAD, SVG-OM1, SVG-OM 2, and SVG-PDA) by Dr. Sherene Dilling 12/2021, HTN, HLD, DM II, morbid obesity and history of right breast cancer s/p lumpectomy.  2D echocardiogram at the time showed EF 55 to 60%, no significant valve issue.  She had a ZIO monitor later that showed frequent PVCs.  Metoprolol  was further increased to 50 mg twice a day.  She was recently seen by Charles Connor NP on 03/02/2024.  At that  time she complained of shortness of breath with activity.  She was placed on 20 mg daily of Lasix .   Echocardiogram obtained on 04/04/2024 showed EF 55 to 60%, no regional wall motion abnormality, RVSP 62.3 mmHg, moderate LAE, trivial MR, mild aortic stenosis.  She was referred to heart failure service due to new pulmonary hypertension.  She has not taken the prescribed Lasix  recently.   For the past month, the pt  has been having increased fatigue and shortness of breath with activity.  She also noticed some lower extremity edema, left worse than right.  She had prior right lower extremity vein harvesting.  She sleeps on 2 pillows, denies any orthopnea or PND but does complain of increased cough lately at night.  She also complainsof intermittent left arm pain (previous angina, prior to bypass surgery, was sharp stabbing  left-sided chest pain which she has not had recently)   Last night, she had a significant diaphoresis.  She eventually sought medical attention at [facility].  Serial troponin was negative.  proBNP 1502.  Renal function and electrolytes normal.  Normal red blood cell count.  EKG shows sinus rhythm with prolonged first-degree AV block and PVC.  Chest x-ray suggestive of heart failure.  CTA PE protocol was obtained due to elevated D-dimer, this showed no evidence of PE or thoracic aortic dissection.  TSH low at less than 0.01.  She was treated with 60 mg IV Lasix  and so far put out 3.6 L of urine.  Her breathing has improved.  She has been transition to torsemide 20 mg daily.   Cardiology service consulted for acute diastolic heart failure.     Past Medical History:  Diagnosis Date   Aortic atherosclerosis (HCC) 01/09/2022   Chest CTA 12/09/2021: Aortic atherosclerosis   Breast cancer (HCC)    CAD (coronary artery disease) 01/09/2022   NSTEMI s/p CABG in 12/2021 (LIMA-LAD, S-OM1, S-OM2, S-PDA; endoscopic vein harvest from right leg) Pre-CABG Dopplers 12/12/2021: Bilateral ICA 1-39 Echocardiogram 12/11/2021: EF 55-60, no RWMA, mild LVH, normal RVSF, normal PASP, trivial MR   Diabetes mellitus without complication (HCC)    HLD (hyperlipidemia) 08/01/2015   Hypertension     Past Surgical History:  Procedure Laterality Date   ABDOMINAL HYSTERECTOMY     APPENDECTOMY  BREAST LUMPECTOMY     CORONARY ARTERY BYPASS GRAFT N/A 12/13/2021   Procedure: CORONARY ARTERY BYPASS GRAFTING (CABG) X  4  , ON PUMP, USING LEFT INTERNAL MAMMARY ARTERY AND RIGHT ENDOSCOPIC GREATER SAPHENOUS VEIN CONDUITS;  Surgeon: Bartley Lightning, MD;  Location: [facility] OR;  Service: Open Heart Surgery;  Laterality: N/A;   ELBOW SURGERY     ENDOVEIN HARVEST OF GREATER SAPHENOUS VEIN Right 12/13/2021   Procedure: ENDOVEIN HARVEST OF GREATER SAPHENOUS VEIN;  Surgeon: Bartley Lightning, MD;  Location: [facility] OR;  Service: Open Heart Surgery;   Laterality: Right;   HERNIA REPAIR     KNEE SURGERY     LEFT HEART CATH AND CORONARY ANGIOGRAPHY N/A 12/11/2021   Procedure: LEFT HEART CATH AND CORONARY ANGIOGRAPHY;  Surgeon: Millicent Ally, MD;  Location: [facility] INVASIVE CV LAB;  Service: Cardiovascular;  Laterality: N/A;   TEE WITHOUT CARDIOVERSION N/A 12/13/2021   Procedure: TRANSESOPHAGEAL ECHOCARDIOGRAM (TEE);  Surgeon: Bartley Lightning, MD;  Location: [facility] OR;  Service: Open Heart Surgery;  Laterality: N/A;     Home Medications:  Prior to Admission medications   Medication Sig Start Date End Date Taking? Authorizing Provider  amLODipine  (NORVASC ) 10 MG tablet Take 10 mg by mouth daily. 03/16/17  Yes [provider]  aspirin  EC 81 MG EC tablet Take 1 tablet (81 mg total) by mouth daily. Swallow whole. 12/20/21  Yes Gold, Wayne E, PA-C  EPINEPHrine  0.3 mg/0.3 mL IJ SOAJ injection Inject 0.3 mg into the muscle as needed for anaphylaxis. 01/04/15  Yes [provider]  furosemide  (LASIX ) 20 MG tablet TAKE 1 TABLET BY MOUTH EVERY DAY Patient taking differently: Take 20 mg by mouth daily as needed for fluid or edema. 03/25/24  Yes Gerald Kitty., NP  lisinopril  (ZESTRIL ) 40 MG tablet Take 40 mg by mouth daily.   Yes [provider]  metoprolol  tartrate (LOPRESSOR ) 25 MG tablet Take 1 tab in the morning and 2 tabs in the evening Patient taking differently: Take 25 mg by mouth in the morning and at bedtime. 03/02/24  Yes Gerald Kitty., NP  nitroGLYCERIN  (NITROSTAT ) 0.4 MG SL tablet Place 0.4 mg under the tongue every 5 (five) minutes as needed for chest pain. 05/07/23  Yes [provider]  polyvinyl alcohol  (LIQUIFILM TEARS) 1.4 % ophthalmic solution Place 1 drop into both eyes as needed for dry eyes.   Yes [provider]  potassium chloride  SA (KLOR-CON  M) 20 MEQ tablet TAKE 1 TABLET BY MOUTH EVERY DAY Patient taking differently: Take 20 mEq by mouth See admin instructions. Take 20 mEq by mouth once a day  only when taking Lasix  03/25/24  Yes Gerald Kitty., NP  rosuvastatin  (CRESTOR ) 20 MG tablet Take 1 tablet (20 mg total) by mouth daily. 03/10/24  Yes Gerald Kitty., NP  TRULICITY 3 MG/0.5ML SOPN Inject 3 mg into the skin every Sunday. 10/30/21  Yes [provider]  Blood Pressure Monitoring (BLOOD PRESSURE CUFF) MISC 1 each by Does not apply route daily. 03/02/24   Gerald Kitty., NP  empagliflozin  (JARDIANCE ) 10 MG TABS tablet Take 1 tablet (10 mg total) by mouth daily. Patient not taking: Reported on 04/10/2024 02/23/23   Odie Benne, MD  lisinopril  (ZESTRIL ) 20 MG tablet TAKE 1 TABLET BY MOUTH EVERY DAY Patient not taking: Reported on 04/10/2024 12/04/23   Odie Benne, MD  meclizine  (ANTIVERT ) 12.5 MG tablet Take 1 tablet (12.5 mg total)  by mouth 2 (two) times daily as needed for dizziness. Patient not taking: Reported on 04/10/2024 02/04/23   Dalene Duck, MD    Inpatient Medications: Scheduled Meds:  amLODipine   10 mg Oral Daily   aspirin  EC  81 mg Oral Daily   enoxaparin  (LOVENOX ) injection  50 mg Subcutaneous Q24H   insulin  aspart  0-15 Units Subcutaneous TID WC   metoprolol  tartrate  25 mg Oral BID   potassium chloride   40 mEq Oral Q4H   rosuvastatin   20 mg Oral Daily   torsemide  20 mg Oral Daily   Continuous Infusions:  PRN Meds: acetaminophen  **OR** acetaminophen , fentaNYL  (SUBLIMAZE ) injection, metoprolol  tartrate, polyethylene glycol  Allergies:    Allergies  Allergen Reactions   Aspirin  Other (See Comments)    Just cannot take this when it is mixed with Mobic   Keflex  [Cephalexin ] Hives   Losartan Rash and Other (See Comments)    Lips became swollen    Social History:   Social History   Socioeconomic History   Marital status: Married    Spouse name: Not on file   Number of children: Not on file   Years of education: Not on file   Highest education level: Not on file  Occupational History   Not on file  Tobacco Use    Smoking status: Never   Smokeless tobacco: Never  Vaping Use   Vaping status: Never Used  Substance and Sexual Activity   Alcohol  use: No   Drug use: No   Sexual activity: Not on file  Other Topics Concern   Not on file  Social History Narrative   Not on file   Social Drivers of Health   Financial Resource Strain: Not on file  Food Insecurity: No Food Insecurity (04/10/2024)   Hunger Vital Sign    Worried About Running Out of Food in the Last Year: Never true    Ran Out of Food in the Last Year: Never true  Transportation Needs: No Transportation Needs (04/10/2024)   PRAPARE - Administrator, Civil Service (Medical): No    Lack of Transportation (Non-Medical): No  Physical Activity: Not on file  Stress: Not on file  Social Connections: Moderately Integrated (04/10/2024)   Social Connection and Isolation Panel [NHANES]    Frequency of Communication with Friends and Family: More than three times a week    Frequency of Social Gatherings with Friends and Family: Once a week    Attends Religious Services: More than 4 times per year    Active Member of [employer] or Organizations: Yes    Attends Banker Meetings: More than 4 times per year    Marital Status: Widowed  Intimate Partner Violence: Not At Risk (04/10/2024)   Humiliation, Afraid, Rape, and Kick questionnaire    Fear of Current or Ex-Partner: No    Emotionally Abused: No    Physically Abused: No    Sexually Abused: No    Family History:   History reviewed. No pertinent family history.   ROS:  Please see the history of present illness.   All other ROS reviewed and negative.     Physical Exam/Data:   Vitals:   04/11/24 0006 04/11/24 0538 04/11/24 0623 04/11/24 1047  BP: (!) 147/62 (!) 127/57  (!) 134/57  Pulse: 67 66  69  Resp: 17 16  16   Temp: 98.1 F (36.7 C) 98.2 F (36.8 C)  98.2 F (36.8 C)  TempSrc: Oral Oral  Oral  SpO2: 98% 97%  100%  Weight:      Height:   5\' 5"  (1.651 m)      Intake/Output Summary (Last 24 hours) at 04/11/2024 1236 Last data filed at 04/11/2024 0830 Gross per 24 hour  Intake 170 ml  Output 3700 ml  Net -3530 ml      04/10/2024    9:53 AM 03/02/2024   10:58 AM 02/23/2023    9:53 AM  Last 3 Weights  Weight (lbs) 225 lb 232 lb 3.2 oz 231 lb 9.6 oz  Weight (kg) 102.059 kg 105.325 kg 105.053 kg     Body mass index is 37.44 kg/m.  General:  Well nourished, well developed, in no acute distress HEENT: normal Neck: no JVD Vascular: No carotid bruits; Distal pulses 2+ bilaterally Cardiac:  normal S1, S2; RRR; no murmur  Lungs:  clear to auscultation bilaterally, no wheezing, rhonchi or rales  Abd: soft, nontender, no hepatomegaly  Ext: no edema Musculoskeletal:  No deformities, BUE and BLE strength normal and equal Skin: warm and dry  Neuro:  CNs 2-12 intact, no focal abnormalities noted Psych:  Normal affect   EKG:  The EKG was personally reviewed and demonstrates: Normal sinus rhythm, first-degree AV block Telemetry:  Telemetry was personally reviewed and demonstrates: Normal sinus rhythm, prolonged first-degree AV block, occasional PVCs.  Relevant CV Studies:  Echo 04/04/2024  1. Left ventricular ejection fraction, by estimation, is 55 to 60%. The  left ventricle has normal function. The left ventricle has no regional  wall motion abnormalities. There is moderate left ventricular hypertrophy.  Left ventricular diastolic  parameters are indeterminate.   2. Right ventricular systolic function is moderately reduced. The right  ventricular size is normal. There is severely elevated pulmonary artery  systolic pressure. The estimated right ventricular systolic pressure is  62.3 mmHg.   3. Left atrial size was moderately dilated.   4. The mitral valve is normal in structure. Trivial mitral valve  regurgitation. No evidence of mitral stenosis.   5. The aortic valve has an indeterminant number of cusps. There is  moderate calcification of  the aortic valve. There is moderate thickening  of the aortic valve. Aortic valve regurgitation is not visualized. Mild  aortic valve stenosis.   6. The inferior vena cava is dilated in size with <50% respiratory  variability, suggesting right atrial pressure of 15 mmHg.   Comparison(s): Changes from prior study are noted. Severely elevated  pulmonary now present, was normal in 2023.   Laboratory Data:  High Sensitivity Troponin:   Recent Labs  Lab 04/11/24 1106  TROPONINIHS 15     Chemistry Recent Labs  Lab 04/10/24 0953 04/11/24 0451 04/11/24 1106  NA 142 138  --   K 3.7 3.1*  --   CL 106 104  --   CO2 24 26  --   GLUCOSE 130* 93  --   BUN 12 14  --   CREATININE 0.76 0.78  --   CALCIUM  9.9 8.9  --   MG  --   --  2.3  GFRNONAA >60 >60  --   ANIONGAP 12 8  --     Recent Labs  Lab 04/10/24 0953  PROT 7.3  ALBUMIN  4.2  AST 28  ALT 36  ALKPHOS 141*  BILITOT 0.6   Lipids No results for input(s): "CHOL", "TRIG", "HDL", "LABVLDL", "LDLCALC", "CHOLHDL" in the last 168 hours.  Hematology Recent Labs  Lab 04/10/24 0953 04/11/24 0451  WBC 4.6 5.3  RBC 4.29 4.05  HGB 13.2 12.6  HCT 39.5 38.2  MCV 92.1 94.3  MCH 30.8 31.1  MCHC 33.4 33.0  RDW 13.1 12.9  PLT 256 229   Thyroid  Recent Labs  Lab 04/10/24 1842  TSH <0.010*    BNP Recent Labs  Lab 04/10/24 0953  PROBNP 1,502.0*    DDimer  Recent Labs  Lab 04/10/24 0953  DDIMER 0.80*     Radiology/Studies:  CT Angio Chest PE W and/or Wo Contrast Result Date: 04/10/2024 CLINICAL DATA:  Fatigue.  SOB. EXAM: CT ANGIOGRAPHY CHEST WITH CONTRAST TECHNIQUE: Multidetector CT imaging of the chest was performed using the standard protocol during bolus administration of intravenous contrast. Multiplanar CT image reconstructions and MIPs were obtained to evaluate the vascular anatomy. RADIATION DOSE REDUCTION: This exam was performed according to the departmental dose-optimization program which includes automated  exposure control, adjustment of the mA and/or kV according to patient size and/or use of iterative reconstruction technique. CONTRAST:  75mL OMNIPAQUE  IOHEXOL  350 MG/ML SOLN COMPARISON:  12/09/2021. FINDINGS: Cardiovascular: Cardiomegaly. No pericardial effusion. No filling defects in pulmonary is to suggest PE. No aortic aneurysm or evidence of dissection. There are atheromatous calcifications of aorta and coronary arteries. Status post median sternotomy and CABG. Mediastinum/Nodes: No enlarged mediastinal, hilar, or axillary lymph nodes. Thyroid gland, trachea, and esophagus demonstrate no significant findings. Lungs/Pleura: Minimal dependent subsegmental atelectasis. Lungs are otherwise clear. No pleural effusion or pneumothorax. Upper Abdomen: No acute abnormality. Musculoskeletal: No chest wall abnormality. No acute or significant osseous findings. Review of the MIP images confirms the above findings. IMPRESSION: 1. No evidence of PE or thoracic aortic dissection. 2. Cardiomegaly. 3. Aortic and coronary artery atherosclerosis (ICD10-I70.0). Electronically Signed   By: Sydell Eva M.D.   On: 04/10/2024 13:54   DG Chest Portable 1 View Result Date: 04/10/2024 CLINICAL DATA:  fatigue EXAM: PORTABLE CHEST - 1 VIEW COMPARISON:  01/15/2022. FINDINGS: Cardiac silhouette is prominent. There is pulmonary interstitial prominence with vascular congestion. No focal consolidation. No pneumothorax or pleural effusion identified. Aorta is calcified. Status post median sternotomy and CABG. IMPRESSION: Findings suggest CHF. Electronically Signed   By: Sydell Eva M.D.   On: 04/10/2024 10:25     Assessment and Plan:   Acute diastolic heart failure -She was seen in March 2025 due to shortness of breath with activity and was started on 40 mg Lasix  which she has not been taking.  Echocardiogram showed significantly elevated RVSP of 62.3 mmHg which is a significant change when compared to the previous  echocardiogram.  She was referred to heart failure service.  On arrival, chest x-ray was concerning for pulmonary edema.  - Received to 60 mg IV Lasix , put out 3.6 L of urine.  Patient appears to be near euvolemic level, left lower extremity slightly more swollen than the right lower extremity.  She was transition to 20 mg daily of torsemide today.  Consider give 1 additional dose of 40mg  IV Lasix .  Low TSH: TSH less than 0.01.  Obtain free T4  CAD s/p CABG x 4: Patient complains of intermittent left arm pain, this does not correlate with degree of activity.  Previous anginal symptom was sharp jabbing left-sided chest pain which has not recurred recently.  Hypertension: Blood pressure on arrival was mildly elevated, however has normalized since.  Hyperlipidemia: On rosuvastatin   DM2    Risk Assessment/Risk Scores:        [place]  Heart Association (NYHA) Functional Class NYHA Class III  For questions or updates, please contact [facility] Please consult [email or web address] for contact info under    Signed, Ervin Heath, PA  04/11/2024 12:36 PM  Pt seen and examined   I agree with findings as noted by Rosetta Cons above PT is a 73 yo with hx of  CAD (CABG in 2023), HTN, HL, T2DM and HFpEF  Pt seen in March for SOB   Placed on Lasix  20 mg   She did not take    Echo in April 2025 LVEF and RVEF normal  PAP 62 mm Hg   Mild AS   Set up to be seen in CHF clinic  The pt presents to [facility] ER for fatigue, diaphoresis, DOE     [facility] has had some LE edema    Interimitt L arm pain (different from previous angina) Labs significant for Trop neg , proBNP 1502, TSH 0.01  The pt has been treated with IV lasix  with improvement in breathing   On exam,  The pt is comfortable laying flat in bed NEck:  JVP is normal Lungs are CTA  Cardiac RRR  NO S3  No murmurs ABd is benign Ext with Trace edema  HFpEF Agree with switching to 20 mg torsemide at home    WIll need close follow up of electrolytes        HTN  Improved   Thyroid  Check Free T4      Ola Berger MD

## 2024-04-11 NOTE — Evaluation (Signed)
 Physical Therapy Evaluation Only Patient Details Name: Katherine Pratt MRN: [id number] DOB: 1951/02/25 Today's Date: 04/11/2024  History of Present Illness  Katherine Pratt is a 73 y.o. female admitted with acute on chronic diastolic congestive heart failure. PMH: HTN, diabetes, breast CA, CAD status post CABG x 4 in 2023  Clinical Impression  Pt ind with bed mobility, transfers and amb in the hallway, no AD, no LOB, conversational, on RA with SpO2 96-98%. Pt needing 2 standing rest breaks, good awareness of limitations. No acute PT needs identified, no f/u recommended. Will sign off at this time.        If plan is discharge home, recommend the following: Assistance with cooking/housework;Assist for transportation   Can travel by private vehicle        Equipment Recommendations None recommended by PT  Recommendations for Other Services       Functional Status Assessment Patient has had a recent decline in their functional status and demonstrates the ability to make significant improvements in function in a reasonable and predictable amount of time.     Precautions / Restrictions Restrictions Weight Bearing Restrictions Per Provider Order: No      Mobility  Bed Mobility Overal bed mobility: Independent                  Transfers Overall transfer level: Independent Equipment used: None                    Ambulation/Gait Ambulation/Gait assistance: Modified independent (Device/Increase time), Supervision Gait Distance (Feet): 400 Feet Assistive device: None Gait Pattern/deviations: WFL(Within Functional Limits) Gait velocity: WFL     General Gait Details: step through gait pattern, able to clear past obstacles, complete head turns and change directions without LOB., conversational, 2 standing rest breaks, on RA with SpO2 96-98% and HR in 70s-90s.  Stairs            Wheelchair Mobility     Tilt Bed    Modified Rankin (Stroke Patients Only)        Balance Overall balance assessment: No apparent balance deficits (not formally assessed)                                           Pertinent Vitals/Pain Pain Assessment Pain Assessment: No/denies pain    Home Living Family/patient expects to be discharged to:: Private residence Living Arrangements: Alone Available Help at Discharge: Family;Available PRN/intermittently Type of Home: House Home Access: Ramped entrance;Stairs to enter   Entrance Stairs-Number of Steps: 3 steps at the front; ramp at the side   Home Layout: One level Home Equipment: None      Prior Function Prior Level of Function : Independent/Modified Independent             Mobility Comments: pt reports ind without AD ADLs Comments: pt reports ind     Extremity/Trunk Assessment   Upper Extremity Assessment Upper Extremity Assessment: Overall WFL for tasks assessed    Lower Extremity Assessment Lower Extremity Assessment: Overall WFL for tasks assessed    Cervical / Trunk Assessment Cervical / Trunk Assessment: Normal  Communication   Communication Communication: No apparent difficulties    Cognition Arousal: Alert Behavior During Therapy: WFL for tasks assessed/performed   PT - Cognitive impairments: No apparent impairments  Following commands: Intact       Cueing       General Comments      Exercises     Assessment/Plan    PT Assessment Patient does not need any further PT services  PT Problem List         PT Treatment Interventions      PT Goals (Current goals can be found in the Care Plan section)  Acute Rehab PT Goals Patient Stated Goal: return home PT Goal Formulation: All assessment and education complete, DC therapy    Frequency       Co-evaluation               AM-PAC PT "6 Clicks" Mobility  Outcome Measure Help needed turning from your back to your side while in a flat bed without using  bedrails?: None Help needed moving from lying on your back to sitting on the side of a flat bed without using bedrails?: None Help needed moving to and from a bed to a chair (including a wheelchair)?: None Help needed standing up from a chair using your arms (e.g., wheelchair or bedside chair)?: None Help needed to walk in hospital room?: None Help needed climbing 3-5 steps with a railing? : None 6 Click Score: 24    End of Session   Activity Tolerance: Patient tolerated treatment well Patient left: in bed;with call bell/phone within reach;with bed alarm set Nurse Communication: Mobility status;Other (comment) (SpO2) PT Visit Diagnosis: Other abnormalities of gait and mobility (R26.89)    Time: 1610-9604 PT Time Calculation (min) (ACUTE ONLY): 18 min   Charges:   PT Evaluation $PT Eval Low Complexity: 1 Low   PT General Charges $$ ACUTE PT VISIT: 1 Visit         Tori Annelie Boak PT, DPT 04/11/24, 4:01 PM

## 2024-04-11 NOTE — Progress Notes (Signed)
 PROGRESS NOTE    Katherine Pratt  ZOX:[id number] DOB: Oct 25, 1951 DOA: 04/10/2024 PCP: Thomasina Fletcher, PA-C  Outpatient Specialists:     Brief Narrative:  Patient is a 73 year old female past medical history significant for coronary artery disease status post quadruple bypass, hypertension, type 2 diabetes mellitus, breast cancer and hyperlipidemia.  Patient is known to the cardiology team.  Echocardiogram done on 04/04/2024 revealed normal ejection fraction, indeterminate diastolic function, mild aortic stenosis, LVH and RVSP of 62.3 mmHg (severe pulmonary hypertension).  Patient is currently being managed for acute on chronic diastolic CHF.  No shortness of breath.  Patient has used 2 pillows at home chronically.  Leg edema is noted.  Patient is on amlodipine .  Normal troponin.  EKG reveals occasional PVCs.  TSH is less than 0.1, without prior diagnosis of thyroid disease.  Potassium is 3.1 today.  Magnesium  level has not been visualized.  04/11/2024: Patient was seen alongside patient's nurse.  Patient reported left upper extremity pain, extreme fatigue and diaphoresis prior to presentation (worrisome for possible angina equivalent, considering prior history).  No shortness of breath.  No chest pain.  Patient does not usually have chest pain with prior coronary artery disease/MI.  As documented above, troponin was negative x 2.  No other constitutional symptoms endorsed.  Patient seems to be improving.   Assessment & Plan:   Principal Problem:   Acute on chronic diastolic congestive heart failure (HCC) Active Problems:   Benign essential hypertension   HLD (hyperlipidemia)   Morbid obesity (HCC)   Uncontrolled type 2 diabetes mellitus with hyperglycemia, without long-term current use of insulin  (HCC)   S/P CABG x 4   CAD (coronary artery disease)   PVC's (premature ventricular contractions)   First degree AV block   History of malignant neoplasm of female breast   Acute on chronic  diastolic congestive heart failure (HCC) -Recent echo revealed new severe pulmonary hypertension, EF of 55 to 60%, moderate LVH, mild aortic stenosis.  - Per prior documentation, patient has been referred to advanced heart care clinic. -Patient was given IV Lasix  60 Mg x 1 dose. - Symptoms seem to have improved. - Negative troponin on presentation. -Cardiac BNP of 1520 on presentation. -Continue torsemide 20 Mg p.o. once daily. -Cardiology team has been consulted.  Hypokalemia: - Potassium of 3.1. - No magnesium  level visualized.  Will check magnesium  level - KCl 40 mEq orally Q 4 x 2 doses. - Repeat renal panel and magnesium  in the morning.  Severe pulmonary hypertension: - As per prior documentation, patient has been referred for further evaluation.   History of malignant neoplasm of female breast Status postlumpectomy with XRT approximately 14 years ago.  Recent normal mammogram per patient.   First degree AV block Has been present for a while.   PVC's (premature ventricular contractions) Continue beta-blocker   History of coronary artery disease S/P CABG x 4: -Patient reports left upper extremity pain, history of fatigue and diaphoresis - Rule out possible anginal equivalent.  Patient does not normally have chest pain.  No shortness of breath. - Negative troponins and EKG. -Reported troponin. - Cardiology team has been consulted.   Uncontrolled type 2 diabetes mellitus with hyperglycemia, without long-term current use of insulin  (HCC) Continue Jardiance  Sliding scale insulin  A1c of 5.7%. On Trulicity home per med rec.  Held this here.  TSH of less than 0.1: - Check free T4. - Further management will depend on above.   HLD (hyperlipidemia) Continue rosuvastatin    Benign  essential hypertension -Continue amlodipine , lisinopril , metoprolol  -Blood pressure is reasonably controlled. -Consider changing amlodipine  to another antihypertensive if leg edema remains a  significant problem.    DVT prophylaxis: Subcutaneous Lovenox  Code Status: Full code Family Communication:  Disposition Plan: Observation   Consultants:  Cardiology  Procedures:  None  Antimicrobials:  None   Subjective: No chest pain. No shortness of breath. Leg edema is improving. Chronic two-pillow orthopnea. Left upper extremity pain, extreme fatigue and diaphoresis are improving.  Objective: Vitals:   04/10/24 2005 04/11/24 0006 04/11/24 0538 04/11/24 0623  BP: (!) 148/62 (!) 147/62 (!) 127/57   Pulse: 80 67 66   Resp: 18 17 16    Temp: 98.5 F (36.9 C) 98.1 F (36.7 C) 98.2 F (36.8 C)   TempSrc: Oral Oral Oral   SpO2: 99% 98% 97%   Weight:      Height:    5\' 5"  (1.651 m)    Intake/Output Summary (Last 24 hours) at 04/11/2024 1041 Last data filed at 04/11/2024 0830 Gross per 24 hour  Intake 170 ml  Output 3700 ml  Net -3530 ml   Filed Weights   04/10/24 0953  Weight: 102.1 kg    Examination:  General exam: Appears calm and comfortable.  Patient is obese. Respiratory system: Clear to auscultation.  Cardiovascular system: S1 & S2 heard Gastrointestinal system: Abdomen is obese and nontender.   Central nervous system: Awake and alert.   Extremities: Mild bilateral lower extremity edema.  Data Reviewed: I have personally reviewed following labs and imaging studies  CBC: Recent Labs  Lab 04/10/24 0953 04/11/24 0451  WBC 4.6 5.3  NEUTROABS 2.5  --   HGB 13.2 12.6  HCT 39.5 38.2  MCV 92.1 94.3  PLT 256 229   Basic Metabolic Panel: Recent Labs  Lab 04/10/24 0953 04/11/24 0451  NA 142 138  K 3.7 3.1*  CL 106 104  CO2 24 26  GLUCOSE 130* 93  BUN 12 14  CREATININE 0.76 0.78  CALCIUM  9.9 8.9   GFR: Estimated Creatinine Clearance: 74.2 mL/min (by C-G formula based on SCr of 0.78 mg/dL). Liver Function Tests: Recent Labs  Lab 04/10/24 0953  AST 28  ALT 36  ALKPHOS 141*  BILITOT 0.6  PROT 7.3  ALBUMIN  4.2   No results for  input(s): "LIPASE", "AMYLASE" in the last 168 hours. No results for input(s): "AMMONIA" in the last 168 hours. Coagulation Profile: No results for input(s): "INR", "PROTIME" in the last 168 hours. Cardiac Enzymes: No results for input(s): "CKTOTAL", "CKMB", "CKMBINDEX", "TROPONINI" in the last 168 hours. BNP (last 3 results) Recent Labs    04/10/24 0953  PROBNP 1,502.0*   HbA1C: Recent Labs    04/10/24 1842  HGBA1C 5.7*   CBG: Recent Labs  Lab 04/10/24 1809 04/10/24 2043 04/11/24 0741  GLUCAP 95 128* 95   Lipid Profile: No results for input(s): "CHOL", "HDL", "LDLCALC", "TRIG", "CHOLHDL", "LDLDIRECT" in the last 72 hours. Thyroid Function Tests: Recent Labs    04/10/24 1842  TSH <0.010*   Anemia Panel: No results for input(s): "VITAMINB12", "FOLATE", "FERRITIN", "TIBC", "IRON", "RETICCTPCT" in the last 72 hours. Urine analysis:    Component Value Date/Time   COLORURINE YELLOW 04/10/2024 1127   APPEARANCEUR CLEAR 04/10/2024 1127   LABSPEC 1.015 04/10/2024 1127   PHURINE 6.5 04/10/2024 1127   GLUCOSEU NEGATIVE 04/10/2024 1127   HGBUR NEGATIVE 04/10/2024 1127   BILIRUBINUR NEGATIVE 04/10/2024 1127   KETONESUR NEGATIVE 04/10/2024 1127   PROTEINUR NEGATIVE 04/10/2024 1127  UROBILINOGEN 1.0 01/26/2015 2210   NITRITE NEGATIVE 04/10/2024 1127   LEUKOCYTESUR NEGATIVE 04/10/2024 1127   Sepsis Labs: @LABRCNTIP (procalcitonin:4,lacticidven:4)  )No results found for this or any previous visit (from the past 240 hours).       Radiology Studies: CT Angio Chest PE W and/or Wo Contrast Result Date: 04/10/2024 CLINICAL DATA:  Fatigue.  SOB. EXAM: CT ANGIOGRAPHY CHEST WITH CONTRAST TECHNIQUE: Multidetector CT imaging of the chest was performed using the standard protocol during bolus administration of intravenous contrast. Multiplanar CT image reconstructions and MIPs were obtained to evaluate the vascular anatomy. RADIATION DOSE REDUCTION: This exam was performed according  to the departmental dose-optimization program which includes automated exposure control, adjustment of the mA and/or kV according to patient size and/or use of iterative reconstruction technique. CONTRAST:  75mL OMNIPAQUE  IOHEXOL  350 MG/ML SOLN COMPARISON:  12/09/2021. FINDINGS: Cardiovascular: Cardiomegaly. No pericardial effusion. No filling defects in pulmonary is to suggest PE. No aortic aneurysm or evidence of dissection. There are atheromatous calcifications of aorta and coronary arteries. Status post median sternotomy and CABG. Mediastinum/Nodes: No enlarged mediastinal, hilar, or axillary lymph nodes. Thyroid gland, trachea, and esophagus demonstrate no significant findings. Lungs/Pleura: Minimal dependent subsegmental atelectasis. Lungs are otherwise clear. No pleural effusion or pneumothorax. Upper Abdomen: No acute abnormality. Musculoskeletal: No chest wall abnormality. No acute or significant osseous findings. Review of the MIP images confirms the above findings. IMPRESSION: 1. No evidence of PE or thoracic aortic dissection. 2. Cardiomegaly. 3. Aortic and coronary artery atherosclerosis (ICD10-I70.0). Electronically Signed   By: Sydell Eva M.D.   On: 04/10/2024 13:54   DG Chest Portable 1 View Result Date: 04/10/2024 CLINICAL DATA:  fatigue EXAM: PORTABLE CHEST - 1 VIEW COMPARISON:  01/15/2022. FINDINGS: Cardiac silhouette is prominent. There is pulmonary interstitial prominence with vascular congestion. No focal consolidation. No pneumothorax or pleural effusion identified. Aorta is calcified. Status post median sternotomy and CABG. IMPRESSION: Findings suggest CHF. Electronically Signed   By: Sydell Eva M.D.   On: 04/10/2024 10:25        Scheduled Meds:  amLODipine   10 mg Oral Daily   aspirin  EC  81 mg Oral Daily   enoxaparin  (LOVENOX ) injection  50 mg Subcutaneous Q24H   insulin  aspart  0-15 Units Subcutaneous TID WC   metoprolol  tartrate  25 mg Oral BID   rosuvastatin   20  mg Oral Daily   Continuous Infusions:   LOS: 0 days    Time spent: 55 minutes.    Fonnie Iba, MD  [facility] Pager #: [phone] 7PM-7AM contact night coverage as above

## 2024-04-12 ENCOUNTER — Other Ambulatory Visit: Payer: Self-pay | Admitting: Physician Assistant

## 2024-04-12 DIAGNOSIS — E0590 Thyrotoxicosis, unspecified without thyrotoxic crisis or storm: Secondary | ICD-10-CM | POA: Diagnosis present

## 2024-04-12 DIAGNOSIS — I5032 Chronic diastolic (congestive) heart failure: Secondary | ICD-10-CM

## 2024-04-12 DIAGNOSIS — I5031 Acute diastolic (congestive) heart failure: Secondary | ICD-10-CM | POA: Diagnosis not present

## 2024-04-12 DIAGNOSIS — I5033 Acute on chronic diastolic (congestive) heart failure: Secondary | ICD-10-CM | POA: Diagnosis not present

## 2024-04-12 LAB — CBC WITH DIFFERENTIAL/PLATELET
Abs Immature Granulocytes: 0 10*3/uL (ref 0.00–0.07)
Basophils Absolute: 0 10*3/uL (ref 0.0–0.1)
Basophils Relative: 0 %
Eosinophils Absolute: 0.2 10*3/uL (ref 0.0–0.5)
Eosinophils Relative: 4 %
HCT: 40 % (ref 36.0–46.0)
Hemoglobin: 12.9 g/dL (ref 12.0–15.0)
Immature Granulocytes: 0 %
Lymphocytes Relative: 42 %
Lymphs Abs: 2 10*3/uL (ref 0.7–4.0)
MCH: 30.7 pg (ref 26.0–34.0)
MCHC: 32.3 g/dL (ref 30.0–36.0)
MCV: 95.2 fL (ref 80.0–100.0)
Monocytes Absolute: 0.6 10*3/uL (ref 0.1–1.0)
Monocytes Relative: 12 %
Neutro Abs: 2 10*3/uL (ref 1.7–7.7)
Neutrophils Relative %: 42 %
Platelets: 240 10*3/uL (ref 150–400)
RBC: 4.2 MIL/uL (ref 3.87–5.11)
RDW: 12.8 % (ref 11.5–15.5)
WBC: 4.7 10*3/uL (ref 4.0–10.5)
nRBC: 0 % (ref 0.0–0.2)

## 2024-04-12 LAB — RENAL FUNCTION PANEL
Albumin: 3.2 g/dL — ABNORMAL LOW (ref 3.5–5.0)
Anion gap: 7 (ref 5–15)
BUN: 17 mg/dL (ref 8–23)
CO2: 26 mmol/L (ref 22–32)
Calcium: 9 mg/dL (ref 8.9–10.3)
Chloride: 104 mmol/L (ref 98–111)
Creatinine, Ser: 0.79 mg/dL (ref 0.44–1.00)
GFR, Estimated: >60 mL/min (ref >60)
Glucose, Bld: 99 mg/dL (ref 70–99)
Phosphorus: 5 mg/dL — ABNORMAL HIGH (ref 2.5–4.6)
Potassium: 3.7 mmol/L (ref 3.5–5.1)
Sodium: 137 mmol/L (ref 135–145)

## 2024-04-12 LAB — GLUCOSE, CAPILLARY
Glucose-Capillary: 135 mg/dL — ABNORMAL HIGH (ref 70–99)
Glucose-Capillary: 94 mg/dL (ref 70–99)

## 2024-04-12 LAB — MAGNESIUM: Magnesium: 2.3 mg/dL (ref 1.7–2.4)

## 2024-04-12 MED ORDER — FUROSEMIDE 20 MG PO TABS: 20.0000 mg | ORAL_TABLET | Freq: Every day | ORAL | 1 refills | Status: DC

## 2024-04-12 NOTE — Discharge Summary (Signed)
 Physician Discharge Summary   Patient: Katherine Pratt MRN: [id number] DOB: [birth year]  Admit date:     04/10/2024  Discharge date: 04/12/24  Discharge Physician: Lorita Rosa   PCP: Chancey Combe, [place]  E, PA-C   Recommendations at discharge:   Continue Lasix , take daily Follow up with cardiology, 5/13 for labs and 5/29 for follow up You appear to have new-onset hyperthyroidism; you are being referred to endocrinology ASAP for evaluation and management Follow up with PA Fulbright in 1-2 weeks  Discharge Diagnoses: Principal Problem:   Acute on chronic diastolic congestive heart failure (HCC) Active Problems:   Benign essential hypertension   HLD (hyperlipidemia)   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  She was given IV Lasix  with return to baseline.  Cardiology consulted.  Assessment and Plan:  Acute on chronic diastolic congestive heart failure Recent echo revealed new severe pulmonary hypertension, EF of 55 to 60%, moderate LVH, mild aortic stenosis.  Per prior documentation, patient has been referred to advanced heart care clinic. Patient was given IV Lasix  60 Mg x 1 dose with improvement Continue Lasix  20 Mg p.o. once daily Cardiology team has been consulted  Hyperthyroidism TSH is markedly suppressed, free T4 is significantly elevated Will refer to endocrinology ASAP for outpatient f/u  Severe  pulmonary hypertension As per prior documentation, patient has been referred for further evaluation.   History of malignant neoplasm of female breast Status postlumpectomy with XRT approximately 14 years ago Recent normal mammogram per patient   CAD/First degree AV block/PVCs S/p CABG Continue 81 mg ASA daily Continue beta-blocker Follow up with cardiology    Uncontrolled type 2 diabetes mellitus with hyperglycemia, without long-term current use of insulin  (HCC) Continue Trulicity A1c of 5.7%, well controlled   HLD (hyperlipidemia) Continue rosuvastatin    Benign essential hypertension Continue amlodipine , lisinopril , metoprolol  Blood pressure is reasonably controlled Consider changing amlodipine  to another antihypertensive if leg edema remains a significant problem   Class 2 obesity Body mass index is 37.44 kg/m.Aaron Aas  Weight loss should be encouraged Outpatient PCP/bariatric medicine f/u encouraged Significantly low or high BMI is associated with higher medical risk including morbidity and mortality      Consultants: Cardiology  Procedures: None  Antibiotics: None      Pain control - [place]  Controlled Substance Reporting System database was reviewed. and patient was instructed, not to drive, operate heavy machinery, perform activities at heights, swimming or participation in water activities or provide baby-sitting services while on Pain, Sleep and Anxiety Medications; until their outpatient Physician has advised to do so again. Also recommended to not to take more than prescribed Pain, Sleep and Anxiety Medications.   Disposition: Home Diet recommendation:  Cardiac and Carb modified diet DISCHARGE MEDICATION: Allergies as of 04/12/2024       Reactions   Aspirin  Other (See Comments)   Just cannot take this when it is mixed with Mobic  Keflex  [cephalexin ] Hives   Losartan Rash, Other (See Comments)   Lips became swollen        Medication List      STOP taking these medications    empagliflozin  10 MG Tabs tablet Commonly known as: Jardiance    meclizine  12.5 MG tablet Commonly known as: ANTIVERT        TAKE these medications    amLODipine  10 MG tablet Commonly known as: NORVASC  Take 10 mg by mouth daily.   aspirin  EC 81 MG tablet Take 1 tablet (81 mg total) by mouth daily. Swallow whole.   Blood Pressure Cuff Misc 1 each by Does not apply route daily.   EPINEPHrine  0.3 mg/0.3 mL Soaj injection Commonly known as: EPI-PEN Inject 0.3 mg into the muscle as needed for anaphylaxis.   furosemide  20 MG tablet Commonly known as: LASIX  Take 1 tablet (20 mg total) by mouth daily. What changed:  when to take this reasons to take this   lisinopril  40 MG tablet Commonly known as: ZESTRIL  Take 40 mg by mouth daily. What changed: Another medication with the same name was removed. Continue taking this medication, and follow the directions you see here.   metoprolol  tartrate 25 MG tablet Commonly known as: LOPRESSOR  Take 1 tab in the morning and 2 tabs in the evening What changed:  how much to take how to take this when to take this additional instructions   nitroGLYCERIN  0.4 MG SL tablet Commonly known as: NITROSTAT  Place 0.4 mg under the tongue every 5 (five) minutes as needed for chest pain.   polyvinyl alcohol  1.4 % ophthalmic solution Commonly known as: LIQUIFILM TEARS Place 1 drop into both eyes as needed for dry eyes.   potassium chloride  SA 20 MEQ tablet Commonly known as: KLOR-CON  M TAKE 1 TABLET BY MOUTH EVERY DAY What changed:  when to take this additional instructions   rosuvastatin  20 MG tablet Commonly known as: CRESTOR  Take 1 tablet (20 mg total) by mouth daily.   Trulicity 3 MG/0.5ML Soaj Generic drug: Dulaglutide Inject 3 mg into the skin every Sunday.        Follow-up Information     Gerald Kitty., NP Follow up on 05/05/2024.   Specialty: Cardiology Why: 8:50AM. Cardiology  follow up Contact information: [address] [place] [address] [phone]         [facility] Dept of [employer] Follow up on 04/19/2024.   Specialty: Cardiology Why: obtain nonfasting blood work BMET in 1 week. [facility] on the first floor Contact information: [address] [place]  [address] [phone]               Discharge Exam:   Subjective: Feeling better, able to get up without significant difficulty, wants to go home.   Objective: Vitals:   04/12/24 0851 04/12/24 1245  BP: (!) 139/58 132/72  Pulse: 66 71  Resp: 18 16  Temp: 98.8 F (37.1 C) 98.1 F (36.7 C)  SpO2: 100% 99%    Intake/Output Summary (Last 24 hours) at 04/12/2024 1248 Last data filed at 04/12/2024 1246 Gross per 24 hour  Intake 430 ml  Output 2550 ml  Net -2120 ml   Filed Weights   04/10/24 0953  Weight: 102.1 kg    Exam:  General:  Appears calm and comfortable and is in NAD, on RA Eyes:  EOMI, normal lids, iris ENT:  grossly normal hearing, lips & tongue, mmm Cardiovascular:  RRR. Tr LE edema.  Respiratory:   CTA bilaterally with no wheezes/rales/rhonchi.  Normal respiratory effort. Abdomen:  soft, NT, ND Skin:  no rash or induration seen on limited exam Musculoskeletal:  grossly normal tone BUE/BLE, good ROM, no bony abnormality Psychiatric:  grossly normal mood and affect, speech fluent and appropriate, AOx3 Neurologic:  CN 2-12 grossly intact, moves all extremities in coordinated fashion    Data Reviewed: I have reviewed the patient's lab results since admission.  Pertinent labs for today include:   Stable BMP Albumin  3.2 Normal CBC TSH <0.01 Free T4 1.24    Condition at discharge: improving  The results of significant diagnostics from this hospitalization (including imaging, microbiology, ancillary and laboratory) are listed below for reference.   Imaging Studies: CT Angio Chest PE W and/or Wo  Contrast Result Date: 04/10/2024 CLINICAL DATA:  Fatigue.  SOB. EXAM: CT ANGIOGRAPHY CHEST WITH CONTRAST TECHNIQUE: Multidetector CT imaging of the chest was performed using the standard protocol during bolus administration of intravenous contrast. Multiplanar CT image reconstructions and MIPs were obtained to evaluate the vascular anatomy. RADIATION DOSE REDUCTION: This exam was performed according to the departmental dose-optimization program which includes automated exposure control, adjustment of the mA and/or kV according to patient size and/or use of iterative reconstruction technique. CONTRAST:  75mL OMNIPAQUE  IOHEXOL  350 MG/ML SOLN COMPARISON:  12/09/2021. FINDINGS: Cardiovascular: Cardiomegaly. No pericardial effusion. No filling defects in pulmonary is to suggest PE. No aortic aneurysm or evidence of dissection. There are atheromatous calcifications of aorta and coronary arteries. Status post median sternotomy and CABG. Mediastinum/Nodes: No enlarged mediastinal, hilar, or axillary lymph nodes. Thyroid gland, trachea, and esophagus demonstrate no significant findings. Lungs/Pleura: Minimal dependent subsegmental atelectasis. Lungs are otherwise clear. No pleural effusion or pneumothorax. Upper Abdomen: No acute abnormality. Musculoskeletal: No chest wall abnormality. No acute or significant osseous findings. Review of the MIP images confirms the above findings. IMPRESSION: 1. No evidence of PE or thoracic aortic dissection. 2. Cardiomegaly. 3. Aortic and coronary artery atherosclerosis (ICD10-I70.0). Electronically Signed   By: Sydell Eva M.D.   On: 04/10/2024 13:54   DG Chest Portable 1 View Result Date: 04/10/2024 CLINICAL DATA:  fatigue EXAM: PORTABLE CHEST - 1 VIEW COMPARISON:  01/15/2022. FINDINGS: Cardiac silhouette is prominent. There is pulmonary interstitial prominence with vascular congestion. No focal consolidation. No pneumothorax or pleural effusion identified. Aorta is calcified.  Status post median sternotomy and CABG. IMPRESSION: Findings suggest CHF. Electronically Signed   By: Sydell Eva M.D.   On: 04/10/2024 10:25   ECHOCARDIOGRAM COMPLETE Result Date: 04/04/2024    ECHOCARDIOGRAM REPORT   Patient Name:   MYRICLE MELLING  Date of Exam: 04/04/2024 Medical Rec #:  [id number]     Height:       65.0 in Accession #:    [id number]    Weight:       232.2 lb Date of Birth:  1951/02/24    BSA:          2.108 m Patient Age:    72 years      BP:           122/68 mmHg Patient Gender: F             HR:           65 bpm. Exam Location:  [address] Procedure: 2D Echo, Cardiac Doppler and Color Doppler (Both Spectral and Color            Flow Doppler were utilized during procedure).  Indications:    R06.00 SOB  History:        Patient has prior history of Echocardiogram examinations, most                 recent 12/11/2021. CAD, Arrythmias:PVC, Signs/Symptoms:Shortness                 of Breath; Risk Factors:Obesity, Hypertension, Diabetes and                 Dyslipidemia.  Sonographer:    Lula Sale RDCS Referring Phys: [phone] Cathaleen Clinton, JR DICK IMPRESSIONS  1. Left ventricular ejection fraction, by estimation, is 55 to 60%. The left ventricle has normal function. The left ventricle has no regional wall motion abnormalities. There is moderate left ventricular hypertrophy. Left ventricular diastolic parameters are indeterminate.  2. Right ventricular systolic function is moderately reduced. The right ventricular size is normal. There is severely elevated pulmonary artery systolic pressure. The estimated right ventricular systolic pressure is 62.3 mmHg.  3. Left atrial size was moderately dilated.  4. The mitral valve is normal in structure. Trivial mitral valve regurgitation. No evidence of mitral stenosis.  5. The aortic valve has an indeterminant number of cusps. There is moderate calcification of the aortic valve. There is moderate thickening of the aortic valve. Aortic valve regurgitation is  not visualized. Mild aortic valve stenosis.  6. The inferior vena cava is dilated in size with <50% respiratory variability, suggesting right atrial pressure of 15 mmHg. Comparison(s): Changes from prior study are noted. Severely elevated pulmonary now present, was normal in 2023. FINDINGS  Left Ventricle: Left ventricular ejection fraction, by estimation, is 55 to 60%. The left ventricle has normal function. The left ventricle has no regional wall motion abnormalities. The left ventricular internal cavity size was normal in size. There is  moderate left ventricular hypertrophy. Left ventricular diastolic parameters are indeterminate. Right Ventricle: The right ventricular size is normal. Right vetricular wall thickness was not well visualized. Right ventricular systolic function is moderately reduced. There is severely elevated pulmonary artery systolic pressure. The tricuspid regurgitant velocity is 3.44 m/s, and with an assumed right atrial pressure of 15 mmHg, the estimated right ventricular systolic pressure is 62.3 mmHg. Left Atrium: Left atrial size was moderately dilated. Right Atrium: Right atrial size was normal in size. Pericardium: There is no evidence of pericardial effusion. Mitral Valve: The mitral valve is normal in structure. Trivial mitral valve regurgitation. No evidence of mitral valve stenosis. Tricuspid Valve: The tricuspid valve is normal in structure. Tricuspid valve regurgitation is mild . No evidence of tricuspid stenosis. Aortic Valve: The aortic valve has an indeterminant number of cusps. There is moderate calcification of the aortic valve. There is moderate thickening of the aortic valve. Aortic valve regurgitation is not visualized. Mild aortic stenosis is present. Aortic valve mean gradient measures 10.0 mmHg. Aortic valve peak gradient measures 19.6 mmHg. Aortic valve area, by VTI measures 1.40 cm. Pulmonic Valve: The pulmonic valve was not well visualized. Pulmonic valve  regurgitation is trivial. No evidence of pulmonic stenosis. Aorta: The aortic root, ascending aorta, aortic arch and descending aorta are all structurally normal, with no evidence of dilitation or obstruction. Venous: The inferior vena cava is dilated in size with less than 50% respiratory variability, suggesting right atrial pressure of 15 mmHg. IAS/Shunts: The atrial septum is grossly normal.  LEFT VENTRICLE PLAX 2D LVIDd:         4.75 cm   Diastology LVIDs:  3.30 cm   LV e' medial:    8.00 cm/s LV PW:         1.20 cm   LV E/e' medial:  22.0 LV IVS:        1.40 cm   LV e' lateral:   15.00 cm/s LVOT diam:     1.90 cm   LV E/e' lateral: 11.7 LV SV:         71 LV SV Index:   33 LVOT Area:     2.84 cm  RIGHT VENTRICLE            IVC RV S prime:     7.72 cm/s  IVC diam: 2.30 cm TAPSE (M-mode): 1.0 cm RVSP:           50.3 mmHg LEFT ATRIUM             Index        RIGHT ATRIUM           Index LA diam:        4.90 cm 2.32 cm/m   RA Pressure: 3.00 mmHg LA Vol (A2C):   73.8 ml 35.01 ml/m  RA Area:     19.80 cm LA Vol (A4C):   67.3 ml 31.93 ml/m  RA Volume:   54.90 ml  26.05 ml/m LA Biplane Vol: 72.2 ml 34.26 ml/m  AORTIC VALVE AV Area (Vmax):    1.37 cm AV Area (Vmean):   1.47 cm AV Area (VTI):     1.40 cm AV Vmax:           221.50 cm/s AV Vmean:          145.500 cm/s AV VTI:            0.504 m AV Peak Grad:      19.6 mmHg AV Mean Grad:      10.0 mmHg LVOT Vmax:         107.00 cm/s LVOT Vmean:        75.500 cm/s LVOT VTI:          0.249 m LVOT/AV VTI ratio: 0.49  AORTA Ao Root diam: 2.80 cm Ao Asc diam:  2.90 cm MITRAL VALVE                TRICUSPID VALVE MV Area (PHT): 4.74 cm     TR Peak grad:   47.3 mmHg MV Decel Time: 160 msec     TR Vmax:        344.00 cm/s MV E velocity: 176.00 cm/s  Estimated RAP:  3.00 mmHg MV A velocity: 102.00 cm/s  RVSP:           50.3 mmHg MV E/A ratio:  1.73                             SHUNTS                             Systemic VTI:  0.25 m                              Systemic Diam: 1.90 cm Sheryle Donning MD Electronically signed by Sheryle Donning MD Signature Date/Time: 04/04/2024/4:54:52 PM    Final     Microbiology: Results for orders placed or performed during the hospital encounter of 12/09/21  Resp Panel by RT-PCR (  Flu A&B, Covid) Nasopharyngeal Swab     Status: None   Collection Time: 12/09/21  9:48 PM   Specimen: Nasopharyngeal Swab; Nasopharyngeal(NP) swabs in vial transport medium  Result Value Ref Range Status   SARS Coronavirus 2 by RT PCR NEGATIVE NEGATIVE Final    Comment: (NOTE) SARS-CoV-2 target nucleic acids are NOT DETECTED.  The SARS-CoV-2 RNA is generally detectable in upper respiratory specimens during the acute phase of infection. The lowest concentration of SARS-CoV-2 viral copies this assay can detect is 138 copies/mL. A negative result does not preclude SARS-Cov-2 infection and should not be used as the sole basis for treatment or other patient management decisions. A negative result may occur with  improper specimen collection/handling, submission of specimen other than nasopharyngeal swab, presence of viral mutation(s) within the areas targeted by this assay, and inadequate number of viral copies(<138 copies/mL). A negative result must be combined with clinical observations, patient history, and epidemiological information. The expected result is Negative.  Fact Sheet for Patients:  BloggerCourse.com  Fact Sheet for Healthcare Providers:  SeriousBroker.it  This test is no t yet approved or cleared by the [place]  FDA and  has been authorized for detection and/or diagnosis of SARS-CoV-2 by FDA under an Emergency Use Authorization (EUA). This EUA will remain  in effect (meaning this test can be used) for the duration of the COVID-19 declaration under Section 564(b)(1) of the Act, 21 U.S.C.section 360bbb-3(b)(1), unless the authorization is terminated   or revoked sooner.       Influenza A by PCR NEGATIVE NEGATIVE Final   Influenza B by PCR NEGATIVE NEGATIVE Final    Comment: (NOTE) The Xpert Xpress SARS-CoV-2/FLU/RSV plus assay is intended as an aid in the diagnosis of influenza from Nasopharyngeal swab specimens and should not be used as a sole basis for treatment. Nasal washings and aspirates are unacceptable for Xpert Xpress SARS-CoV-2/FLU/RSV testing.  Fact Sheet for Patients: BloggerCourse.com  Fact Sheet for Healthcare Providers: SeriousBroker.it  This test is not yet approved or cleared by the [place]  FDA and has been authorized for detection and/or diagnosis of SARS-CoV-2 by FDA under an Emergency Use Authorization (EUA). This EUA will remain in effect (meaning this test can be used) for the duration of the COVID-19 declaration under Section 564(b)(1) of the Act, 21 U.S.C. section 360bbb-3(b)(1), unless the authorization is terminated or revoked.  Performed at [facility], [address], [place], [place] [address]   Surgical pcr screen     Status: None   Collection Time: 12/13/21  3:50 AM   Specimen: Nasal Mucosa; Nasal Swab  Result Value Ref Range Status   MRSA, PCR NEGATIVE NEGATIVE Final   Staphylococcus aureus NEGATIVE NEGATIVE Final    Comment: (NOTE) The Xpert SA Assay (FDA approved for NASAL specimens in patients 70 years of age and older), is one component of a comprehensive surveillance program. It is not intended to diagnose infection nor to guide or monitor treatment. Performed at [facility], [address], [place], [place] [address]     Labs: CBC: Recent Labs  Lab 04/10/24 0953 04/11/24 0451 04/12/24 0526  WBC 4.6 5.3 4.7  NEUTROABS 2.5  --  2.0  HGB 13.2 12.6 12.9  HCT 39.5 38.2 40.0  MCV 92.1 94.3 95.2  PLT 256 229 240   Basic Metabolic Panel: Recent Labs  Lab 04/10/24 0953 04/11/24 0451 04/11/24 1106  04/11/24 1943 04/12/24 0526  NA 142 138  --  138 137  K 3.7 3.1*  --  4.2 3.7  CL 106 104  --  103 104  CO2 24 26  --  26 26  GLUCOSE 130* 93  --  116* 99  BUN 12 14  --  17 17  CREATININE 0.76 0.78  --  0.78 0.79  CALCIUM  9.9 8.9  --  9.3 9.0  MG  --   --  2.3  --  2.3  PHOS  --   --   --   --  5.0*   Liver Function Tests: Recent Labs  Lab 04/10/24 0953 04/12/24 0526  AST 28  --   ALT 36  --   ALKPHOS 141*  --   BILITOT 0.6  --   PROT 7.3  --   ALBUMIN  4.2 3.2*   CBG: Recent Labs  Lab 04/11/24 1129 04/11/24 1633 04/11/24 2040 04/12/24 0826 04/12/24 1122  GLUCAP 96 93 108* 135* 94    Discharge time spent: greater than 30 minutes.  Signed: Lorita Rosa, MD [facility] 04/12/2024

## 2024-04-12 NOTE — TOC Transition Note (Signed)
 Transition of Care [facility]) - Discharge Note   Patient Details  Name: Katherine Pratt MRN: [id number] Date of Birth: September 08, 1951  [facility] CM/SW Contact:  Ruben Corolla, RN Phone Number: 04/12/2024, 1:24 PM   Clinical Narrative:  d/c home no CM needs.     Final next level of care: Home/Self Care Barriers to Discharge: No Barriers Identified   Patient Goals and CMS Choice            Discharge Placement                       Discharge Plan and Services Additional resources added to the After Visit Summary for                                       Social Drivers of Health (SDOH) Interventions SDOH Screenings   Food Insecurity: No Food Insecurity (04/10/2024)  Housing: Low Risk  (04/11/2024)  Transportation Needs: No Transportation Needs (04/10/2024)  Utilities: Not At Risk (04/10/2024)  Social Connections: Moderately Integrated (04/10/2024)  Tobacco Use: Low Risk  (04/10/2024)     Readmission Risk Interventions     No data to display

## 2024-04-12 NOTE — Progress Notes (Signed)
 Patient is alert, oriented x4 and ambulatory without assistance. She is stable. Discharge instructions were reviewed. She denied questions, or concerns at this time.

## 2024-04-12 NOTE — Progress Notes (Addendum)
 Rounding Note    Patient Name: Katherine Pratt Date of Encounter: 04/12/2024  [facility] Cardiologist: Antoinette Batman, MD   Subjective   Denies any CP, breathing very close to baseline.   Inpatient Medications    Scheduled Meds:  amLODipine   10 mg Oral Daily   aspirin  EC  81 mg Oral Daily   enoxaparin  (LOVENOX ) injection  50 mg Subcutaneous Q24H   insulin  aspart  0-15 Units Subcutaneous TID WC   metoprolol  tartrate  25 mg Oral BID   rosuvastatin   20 mg Oral Daily   torsemide  20 mg Oral Daily   Continuous Infusions:  PRN Meds: acetaminophen  **OR** acetaminophen , fentaNYL  (SUBLIMAZE ) injection, metoprolol  tartrate, polyethylene glycol   Vital Signs    Vitals:   04/11/24 1047 04/11/24 1334 04/11/24 2039 04/12/24 0517  BP: (!) 134/57 (!) 144/63 138/66 139/61  Pulse: 69 66 70 66  Resp: 16 19 19 17   Temp: 98.2 F (36.8 C) 97.7 F (36.5 C) 98 F (36.7 C) 97.9 F (36.6 C)  TempSrc: Oral Oral Oral Oral  SpO2: 100% 100% 96% 97%  Weight:      Height:        Intake/Output Summary (Last 24 hours) at 04/12/2024 0823 Last data filed at 04/12/2024 0354 Gross per 24 hour  Intake 550 ml  Output 1650 ml  Net -1100 ml      04/10/2024    9:53 AM 03/02/2024   10:58 AM 02/23/2023    9:53 AM  Last 3 Weights  Weight (lbs) 225 lb 232 lb 3.2 oz 231 lb 9.6 oz  Weight (kg) 102.059 kg 105.325 kg 105.053 kg      Telemetry    NSR with prolonged 1st degree AV block and occasional PVCs - Personally Reviewed  ECG    NSR with 1st degree AV block - Personally Reviewed  Physical Exam   GEN: No acute distress.   Neck: No JVD Cardiac: RRR, no murmurs, rubs, or gallops.  Respiratory: Clear to auscultation bilaterally. GI: Soft, nontender, non-distended  MS: No edema; No deformity. Neuro:  Nonfocal  Psych: Normal affect   Labs    High Sensitivity Troponin:   Recent Labs  Lab 04/11/24 1106 04/11/24 1248  TROPONINIHS 15 15     Chemistry Recent Labs  Lab  04/10/24 0953 04/11/24 0451 04/11/24 1106 04/11/24 1943 04/12/24 0526  NA 142 138  --  138 137  K 3.7 3.1*  --  4.2 3.7  CL 106 104  --  103 104  CO2 24 26  --  26 26  GLUCOSE 130* 93  --  116* 99  BUN 12 14  --  17 17  CREATININE 0.76 0.78  --  0.78 0.79  CALCIUM  9.9 8.9  --  9.3 9.0  MG  --   --  2.3  --  2.3  PROT 7.3  --   --   --   --   ALBUMIN  4.2  --   --   --  3.2*  AST 28  --   --   --   --   ALT 36  --   --   --   --   ALKPHOS 141*  --   --   --   --   BILITOT 0.6  --   --   --   --   GFRNONAA >60 >60  --  >60 >60  ANIONGAP 12 8  --  9 7  Lipids No results for input(s): "CHOL", "TRIG", "HDL", "LABVLDL", "LDLCALC", "CHOLHDL" in the last 168 hours.  Hematology Recent Labs  Lab 04/10/24 0953 04/11/24 0451 04/12/24 0526  WBC 4.6 5.3 4.7  RBC 4.29 4.05 4.20  HGB 13.2 12.6 12.9  HCT 39.5 38.2 40.0  MCV 92.1 94.3 95.2  MCH 30.8 31.1 30.7  MCHC 33.4 33.0 32.3  RDW 13.1 12.9 12.8  PLT 256 229 240   Thyroid  Recent Labs  Lab 04/10/24 1842 04/11/24 1106 04/11/24 1943  TSH <0.010*  --   --   FREET4  --    < > 1.24*   < > = values in this interval not displayed.    BNP Recent Labs  Lab 04/10/24 0953  PROBNP 1,502.0*    DDimer  Recent Labs  Lab 04/10/24 0953  DDIMER 0.80*     Radiology    CT Angio Chest PE W and/or Wo Contrast Result Date: 04/10/2024 CLINICAL DATA:  Fatigue.  SOB. EXAM: CT ANGIOGRAPHY CHEST WITH CONTRAST TECHNIQUE: Multidetector CT imaging of the chest was performed using the standard protocol during bolus administration of intravenous contrast. Multiplanar CT image reconstructions and MIPs were obtained to evaluate the vascular anatomy. RADIATION DOSE REDUCTION: This exam was performed according to the departmental dose-optimization program which includes automated exposure control, adjustment of the mA and/or kV according to patient size and/or use of iterative reconstruction technique. CONTRAST:  75mL OMNIPAQUE  IOHEXOL  350 MG/ML SOLN  COMPARISON:  12/09/2021. FINDINGS: Cardiovascular: Cardiomegaly. No pericardial effusion. No filling defects in pulmonary is to suggest PE. No aortic aneurysm or evidence of dissection. There are atheromatous calcifications of aorta and coronary arteries. Status post median sternotomy and CABG. Mediastinum/Nodes: No enlarged mediastinal, hilar, or axillary lymph nodes. Thyroid gland, trachea, and esophagus demonstrate no significant findings. Lungs/Pleura: Minimal dependent subsegmental atelectasis. Lungs are otherwise clear. No pleural effusion or pneumothorax. Upper Abdomen: No acute abnormality. Musculoskeletal: No chest wall abnormality. No acute or significant osseous findings. Review of the MIP images confirms the above findings. IMPRESSION: 1. No evidence of PE or thoracic aortic dissection. 2. Cardiomegaly. 3. Aortic and coronary artery atherosclerosis (ICD10-I70.0). Electronically Signed   By: Sydell Eva M.D.   On: 04/10/2024 13:54   DG Chest Portable 1 View Result Date: 04/10/2024 CLINICAL DATA:  fatigue EXAM: PORTABLE CHEST - 1 VIEW COMPARISON:  01/15/2022. FINDINGS: Cardiac silhouette is prominent. There is pulmonary interstitial prominence with vascular congestion. No focal consolidation. No pneumothorax or pleural effusion identified. Aorta is calcified. Status post median sternotomy and CABG. IMPRESSION: Findings suggest CHF. Electronically Signed   By: Sydell Eva M.D.   On: 04/10/2024 10:25    Cardiac Studies   Echo 04/04/2024  1. Left ventricular ejection fraction, by estimation, is 55 to 60%. The  left ventricle has normal function. The left ventricle has no regional  wall motion abnormalities. There is moderate left ventricular hypertrophy.  Left ventricular diastolic  parameters are indeterminate.   2. Right ventricular systolic function is moderately reduced. The right  ventricular size is normal. There is severely elevated pulmonary artery  systolic pressure. The  estimated right ventricular systolic pressure is  62.3 mmHg.   3. Left atrial size was moderately dilated.   4. The mitral valve is normal in structure. Trivial mitral valve  regurgitation. No evidence of mitral stenosis.   5. The aortic valve has an indeterminant number of cusps. There is  moderate calcification of the aortic valve. There is moderate thickening  of the aortic valve.  Aortic valve regurgitation is not visualized. Mild  aortic valve stenosis.   6. The inferior vena cava is dilated in size with <50% respiratory  variability, suggesting right atrial pressure of 15 mmHg.   Comparison(s): Changes from prior study are noted. Severely elevated  pulmonary now present, was normal in 2023.   Patient Profile     73 y.o. female with a hx of CAD s/p CABG x4 (LIMA-LAD, SVG-OM1, SVG-OM 2, and SVG-PDA) by Dr. Sherene Dilling 12/2021, HTN, HLD, DM II, morbid obesity and history of right breast cancer s/p lumpectomy who is being seen 04/11/2024 for the evaluation of SOB   Assessment & Plan    Acute diastolic heart failure -She was seen in March 2025 due to shortness of breath with activity and was started on 40 mg Lasix  which she has not been taking.  Echocardiogram showed significantly elevated RVSP of 62.3 mmHg which is a significant change when compared to the previous echocardiogram.  She was referred to heart failure service.  On arrival, chest x-ray was concerning for pulmonary edema.             - Received to 60 mg IV Lasix , put out 3.6 L of urine.  - transitioned to 20mg  torsemide yesterday, previously on lasix  20mg  PRN at home. Patient is near euvolemic. Likely can be discharged today on the 20mg  torsemide. 1 week post hospital BMET.  Earlier follow up appt made.    Low TSH: TSH less than 0.01.  Obtain free T4   CAD s/p CABG x 4: Patient complains of intermittent left arm pain, this does not correlate with degree of activity.  Previous anginal symptom was sharp jabbing left-sided chest pain  which has not recurred recently.   Hypertension: Blood pressure on arrival was mildly elevated, however has normalized since.   Hyperlipidemia: On rosuvastatin    DM2      For questions or updates, please contact [facility] Please consult [email or web address] for contact info under        Signed, Ervin Heath, PA  04/12/2024, 8:23 AM    PT seen and examined   Agree with findings as noted by Rosetta Cons above Pt breathing good  Lungs CTA Cardiac  RRR  No S3  Abd is benign Ext are without edema  HFpEF     Volume status is good   On oral diuretic   Pt set up for outpt labs and follow up   REcomm she weigh herself daily, call for increases of over 5 lbs  WIll sign off   Please call with questions   Ola Berger MD

## 2024-04-12 NOTE — Progress Notes (Signed)
 OT Cancellation Note  Patient Details Name: Katherine Pratt MRN: [id number] DOB: 1951/09/29   Cancelled Treatment:    Reason Eval/Treat Not Completed: OT screened, no needs identified, will sign off Patient indicated being independent in Adls in room and feeling confident in being able to transition home at current level when medically stable. Patient did not want further assessment of LUE and noted to be sleeping on this side during this discussion. OT to sign off at this time.  Wynette Heckler, MS Acute Rehabilitation Department Office# [phone]  04/12/2024, 10:13 AM

## 2024-04-12 NOTE — Hospital Course (Signed)
 72yo with h/o CAD s/p CABG, HTN, DM, breast CA, HLD, chronic HFpEF, and severe pulmonary HTN on recent (4/28) echo) who presented on 5/4 with acute on chronic diastolic CHF.  She was given IV Lasix  with return to baseline.  Cardiology consulted.

## 2024-05-04 NOTE — Progress Notes (Signed)
 Cardiology Office Note    Patient Name: Katherine Pratt Date of Encounter: 05/05/2024  Primary Care Provider:  Kattie Parrot, PA-C Primary Cardiologist:  Antoinette Batman, MD Primary Electrophysiologist: None   Past Medical History    Past Medical History:  Diagnosis Date   Aortic atherosclerosis (HCC) 01/09/2022   Chest CTA 12/09/2021: Aortic atherosclerosis   Breast cancer (HCC)    CAD (coronary artery disease) 01/09/2022   NSTEMI s/p CABG in 12/2021 (LIMA-LAD, S-OM1, S-OM2, S-PDA; endoscopic vein harvest from right leg) Pre-CABG Dopplers 12/12/2021: Bilateral ICA 1-39 Echocardiogram 12/11/2021: EF 55-60, no RWMA, mild LVH, normal RVSF, normal PASP, trivial MR   Diabetes mellitus without complication (HCC)    HLD (hyperlipidemia) 08/01/2015   Hypertension     History of Present Illness   Katherine Pratt is a 73 y.o. female with a PMH of CAD s/p NSTEMI and CABG x 4 12/2021, DM type II, HTN, pulmonary HTN, HLD, morbid obesity, hyperthyroidism, right breast CA s/p lumpectomy with radiation who presents today for posthospital follow-up.  Katherine Pratt was last seen in the office on 03/02/2024 annual follow-up.  During visit patient had complaint of shortness of breath extremity swelling.  She was started on Lasix  20 mg twice daily x 3 days and then as needed.  She completed an updated 2D echo that showed EF of 55 to 60% with no RWMA and RVSP of 62.3 mmHg with moderate LAE and trivial MR with mild aortic stenosis.  She was referred to the advanced heart failure clinic for new diagnosis of pulmonary hypertension.  She was seen in the ED on 04/10/24 with complaint of shortness of breath and increased fatigue.  She reported noncompliance with as needed Lasix .  She also noted immediate left arm pain that did not correlate with previous discomfort prior to bypass.  She presented to [facility] and reported significant diaphoresis with serial troponins negative and proBNP of 1502.  She had elevated D-dimer but  CTA of the chest showed no evidence of PE.  She was treated with 6 mg IV Lasix  with improvement to breathing.  She was also found to have new onset hyperthyroidism referred to endocrinology for further evaluation.  She was continued on Lasix  20 mg p.o. plan to transition to torsemide  at follow-up.  Katherine Pratt presents today for post hospital follow up. She is currently taking metoprolol , two tablets daily. She was advised to take Lasix  daily but has not been doing so regularly, believing it was only necessary if her legs swelled. She experiences occasional shortness of breath with walking and fluctuating pedal edema. She has a history of hypertension and previously discontinued lisinopril  due to side effects, including a cough, and had issues with losartan. She is not on any ACE inhibitors or ARBs currently. Her diet is low in salt, as she cooks with fresh vegetables and avoids canned goods. She occasionally uses frozen vegetables and is mindful of processed meats. She has experience with the Mediterranean diet. She is on Trulicity for blood sugar management and reports stable blood sugars. She has not been weighing herself daily as she no longer has a scale at home. Patient denies chest pain, palpitations, dyspnea, PND, orthopnea, nausea, vomiting, dizziness, syncope, edema, weight gain, or early satiety.  Discussed the use of AI scribe software for clinical note transcription with the patient, who gave verbal consent to proceed.  History of Present Illness   Review of Systems  Please see the history of present illness.    All other  systems reviewed and are otherwise negative except as noted above.  Physical Exam     Wt Readings from Last 3 Encounters:  05/05/24 218 lb 6.4 oz (99.1 kg)  04/10/24 225 lb (102.1 kg)  03/02/24 232 lb 3.2 oz (105.3 kg)   VS: Vitals:   05/05/24 0836 05/05/24 0847  BP: (!) 157/82 (!) 163/76  Pulse: (!) 59   SpO2: 98%   ,Body mass index is 36.34 kg/m. GEN: Well  nourished, well developed in no acute distress Neck: No JVD; No carotid bruits Pulmonary: Clear to auscultation without rales, wheezing or rhonchi  Cardiovascular: Normal rate. Regular rhythm. Normal S1. Normal S2.   Murmurs: There is no murmur.  ABDOMEN: Soft, non-tender, non-distended EXTREMITIES trace lower extremity edema  EKG/LABS/ Recent Cardiac Studies   ECG personally reviewed by me today -sinus bradycardia with rate of 59 bpm and first-degree AVB with no acute changes consistent with previous EKG.  Risk Assessment/Calculations:        Lab Results  Component Value Date   WBC 4.7 04/12/2024   HGB 12.9 04/12/2024   HCT 40.0 04/12/2024   MCV 95.2 04/12/2024   PLT 240 04/12/2024   Lab Results  Component Value Date   CREATININE 0.79 04/12/2024   BUN 17 04/12/2024   NA 137 04/12/2024   K 3.7 04/12/2024   CL 104 04/12/2024   CO2 26 04/12/2024   Lab Results  Component Value Date   CHOL 132 03/02/2024   HDL 50 03/02/2024   LDLCALC 70 03/02/2024   TRIG 56 03/02/2024   CHOLHDL 2.6 03/02/2024    Lab Results  Component Value Date   HGBA1C 5.7 (H) 04/10/2024   Assessment & Plan    Assessment and Plan Assessment & Plan Diastolic heart failure Diastolic heart failure with preserved ejection fraction, impaired diastolic filling, normal systolic function, myocardial hypertrophy likely due to hypertension. Dyspnea and pulmonary hypertension present. Emphasized medication adherence and dietary modifications. - Resume Lasix  daily, increase to twice daily for three days to manage fluid overload. - Start spironolactone  12.5 mg daily. - Educate on low-sodium diet, avoiding processed meats, and following the Mediterranean diet. - Refer to the heart failure clinic for specialized management. - Order lab work to monitor kidney function and potassium levels in two weeks.  Pulmonary hypertension Severe pulmonary hypertension secondary to diastolic heart failure, contributing to  dyspnea and right heart strain. - Refer to the heart failure clinic for management of pulmonary hypertension.  Swelling of feet Peripheral edema likely due to fluid retention associated with heart failure. Compression socks provide some relief. - Increase Lasix  to twice daily for three days to reduce edema. - Encourage use of compression socks.  Allergy to lisinopril  and losartan Documented allergy to lisinopril  and losartan, with history of cough and concern for angioedema. Educated on risks and discussed alternatives. - Document lisinopril  and losartan as allergies in the medical record. - Avoid prescribing ACE inhibitors or ARBs.  Goals of Care Discussed importance of lifestyle modifications and medication adherence in managing heart failure and pulmonary hypertension. Emphasized heart failure is manageable with proper care. - Educate on the importance of daily weight monitoring to detect fluid retention early. - Provide resources for dietary modifications and encourage adherence to the Mediterranean diet.  Follow-up Follow-up care essential to monitor response to treatment and adjust management plan. - Schedule follow-up appointment in two months. - Ensure referral to the heart failure clinic is completed. - Provide prescription for a scale and blood pressure cuff  to monitor weight and blood pressure at home.  1.  HFpEF/PHT: - Patient recently hospitalized with acute diastolic CHF exacerbation IV Lasix  with 0.6 L of urine output - Patient completed prior 2D echo that showed preserved EF with elevated RVSP of 62.3 mmHg. - Dyspnea and pulmonary hypertension present. Emphasized medication adherence and dietary modifications. - Resume Lasix  daily, increase to twice daily for three days to manage fluid overload. - Start spironolactone 12.5 mg daily. - Educate on low-sodium diet, avoiding processed meats, and following the Mediterranean diet. - Refer to the heart failure clinic for  specialized management of PHT. -BMET and BNP  - Order lab work to monitor kidney function and potassium levels in two weeks.  2.History of CAD: - CABG x 4 with Dr. Sherene Dilling with (LIMA-LAD, S-OM1, S-OM2, S-PDA 2023 -Today patient reports   3.  Primary HTN: -HYPERTENSION CONTROL Vitals:   05/05/24 0836 05/05/24 0847  BP: (!) 157/82 (!) 163/76    The patient's blood pressure is elevated above target today.  In order to address the patient's elevated BP: A current anti-hypertensive medication was adjusted today.; Follow up with general cardiology has been recommended.   Patient discontinued lisinopril  due to side effect of cough. - We will start spironolactone 12.5 mg daily - BMET in 2 weeks  4.  Lower extremity edema: - Bilateral +1 pitting edema in the setting of Lasix  noncompliance - Patient will resume Lasix  as noted above. - Patient advised to elevate her feet and use compression stockings if standing for prolonged periods.  5.  Hyperthyroidism: - Newly diagnosed during recent hospitalization. - Patient is advised and aware to follow-up with endocrinology/PCP.  6.  Hyperlipidemia: - Patient's last LDL cholesterol was 70 on 1/61/0960 - Continue Crestor  20 mg daily  Disposition: Follow-up with Antoinette Batman, MD or APP in 3 months    Signed, Francene Ing, Retha Cast, NP 05/05/2024, 10:17 AM [facility]

## 2024-05-05 ENCOUNTER — Ambulatory Visit: Attending: Nurse Practitioner | Admitting: Nurse Practitioner

## 2024-05-05 ENCOUNTER — Encounter: Payer: Self-pay | Admitting: Nurse Practitioner

## 2024-05-05 ENCOUNTER — Other Ambulatory Visit (HOSPITAL_COMMUNITY): Payer: Self-pay

## 2024-05-05 VITALS — BP 163/76 | HR 59 | Ht 65.0 in | Wt 218.4 lb

## 2024-05-05 DIAGNOSIS — E0590 Thyrotoxicosis, unspecified without thyrotoxic crisis or storm: Secondary | ICD-10-CM | POA: Diagnosis not present

## 2024-05-05 DIAGNOSIS — I5032 Chronic diastolic (congestive) heart failure: Secondary | ICD-10-CM | POA: Diagnosis not present

## 2024-05-05 DIAGNOSIS — E7800 Pure hypercholesterolemia, unspecified: Secondary | ICD-10-CM

## 2024-05-05 DIAGNOSIS — I2510 Atherosclerotic heart disease of native coronary artery without angina pectoris: Secondary | ICD-10-CM | POA: Diagnosis not present

## 2024-05-05 DIAGNOSIS — I272 Pulmonary hypertension, unspecified: Secondary | ICD-10-CM

## 2024-05-05 MED ORDER — SPIRONOLACTONE 25 MG PO TABS
12.5000 mg | ORAL_TABLET | Freq: Every day | ORAL | 1 refills | Status: DC
Start: 2024-05-05 — End: 2024-07-05
  Filled 2024-05-05: qty 45, 90d supply, fill #0

## 2024-05-05 MED ORDER — OMRON 3 SERIES BP MONITOR DEVI
1.0000 | Freq: Every day | 0 refills | Status: AC
  Filled 2024-05-05: qty 1, 30d supply, fill #0

## 2024-05-05 MED ORDER — BMI DIGITAL SMART SCALE MISC
1.0000 | Freq: Every day | 0 refills | Status: AC
  Filled 2024-05-05: qty 1, fill #0

## 2024-05-05 MED ORDER — FUROSEMIDE 20 MG PO TABS
20.0000 mg | ORAL_TABLET | Freq: Every day | ORAL | 1 refills | Status: DC
Start: 2024-05-05 — End: 2024-05-10
  Filled 2024-05-05: qty 90, 90d supply, fill #0

## 2024-05-05 NOTE — Patient Instructions (Signed)
 Medication Instructions:  START Spironolactone 12.5mg  Take 1 tablet once a day (tablet comes in 25mg  you will need to break  the tablet in half) INCREASE Lasix  to 20mg  take 1 tablet twice a day for 3 days then go back to once a day    *If you need a refill on your cardiac medications before your next appointment, please call your pharmacy*  Lab Work: TODAY-BMET & BNP 2 WEEKS BMET (05/19/2024) If you have labs (blood work) drawn today and your tests are completely normal, you will receive your results only by: MyChart Message (if you have MyChart) OR A paper copy in the mail If you have any lab test that is abnormal or we need to change your treatment, we will call you to review the results.  Testing/Procedures: NONE ORDERED  Follow-Up: At [facility], you and your health needs are our priority.  As part of our continuing mission to provide you with exceptional heart care, our providers are all part of one team.  This team includes your primary Cardiologist (physician) and Advanced Practice Providers or APPs (Physician Assistants and Nurse Practitioners) who all work together to provide you with the care you need, when you need it.  Your next appointment:   2 month(s)  Provider:   Charles Connor, NP        We recommend signing up for the patient portal called "MyChart".  Sign up information is provided on this After Visit Summary.  MyChart is used to connect with patients for Virtual Visits (Telemedicine).  Patients are able to view lab/test results, encounter notes, upcoming appointments, etc.  Non-urgent messages can be sent to your provider as well.   To learn more about what you can do with MyChart, go to [email or web address].   Other Instructions Please check your weight daily. Please contact the office if you gain more than 2lbs in a day or 5lbs in a week.  Limit your salt intake to 1500-2000mg  per day or 500mg  of Sodium per meal.

## 2024-05-06 ENCOUNTER — Ambulatory Visit: Payer: Self-pay | Admitting: Nurse Practitioner

## 2024-05-06 LAB — BASIC METABOLIC PANEL WITH GFR
BUN/Creatinine Ratio: 18 (ref 12–28)
BUN: 12 mg/dL (ref 8–27)
CO2: 21 mmol/L (ref 20–29)
Calcium: 9.9 mg/dL (ref 8.7–10.3)
Chloride: 105 mmol/L (ref 96–106)
Creatinine, Ser: 0.66 mg/dL (ref 0.57–1.00)
Glucose: 131 mg/dL — ABNORMAL HIGH (ref 70–99)
Potassium: 4 mmol/L (ref 3.5–5.2)
Sodium: 142 mmol/L (ref 134–144)
eGFR: 93 mL/min/{1.73_m2} (ref >59)

## 2024-05-06 LAB — PRO B NATRIURETIC PEPTIDE: NT-Pro BNP: 1078 pg/mL — ABNORMAL HIGH (ref 0–301)

## 2024-05-09 ENCOUNTER — Other Ambulatory Visit (HOSPITAL_COMMUNITY): Payer: Self-pay

## 2024-05-09 NOTE — Telephone Encounter (Signed)
 Contacted the patient to discuss lab results and she states she took the lasix  bid from Friday to Sunday then today started back with once a day, but the patient states the lasix  did not make her have to urinate. Patient wants to know if she needs something stronger.

## 2024-05-10 MED ORDER — FUROSEMIDE 20 MG PO TABS
20.0000 mg | ORAL_TABLET | Freq: Every day | ORAL | 1 refills | Status: DC
Start: 2024-05-10 — End: 2024-07-05

## 2024-05-23 ENCOUNTER — Other Ambulatory Visit: Payer: Self-pay

## 2024-05-23 DIAGNOSIS — I2510 Atherosclerotic heart disease of native coronary artery without angina pectoris: Secondary | ICD-10-CM

## 2024-05-23 DIAGNOSIS — I272 Pulmonary hypertension, unspecified: Secondary | ICD-10-CM

## 2024-05-23 DIAGNOSIS — E7800 Pure hypercholesterolemia, unspecified: Secondary | ICD-10-CM

## 2024-05-23 DIAGNOSIS — I5032 Chronic diastolic (congestive) heart failure: Secondary | ICD-10-CM

## 2024-05-23 DIAGNOSIS — E0590 Thyrotoxicosis, unspecified without thyrotoxic crisis or storm: Secondary | ICD-10-CM

## 2024-05-31 ENCOUNTER — Ambulatory Visit: Admitting: Nurse Practitioner

## 2024-06-03 ENCOUNTER — Telehealth (HOSPITAL_COMMUNITY): Payer: Self-pay | Admitting: Cardiology

## 2024-06-03 NOTE — Telephone Encounter (Signed)
 Called to confirm/remind patient of their appointment at the [facility] Failure Clinic on 06/03/2024.   Appointment:   [x] Confirmed  [] Left mess   [] No answer/No voice mail  [] VM Full/unable to leave message  [] Phone not in service  Patient reminded to bring all medications and/or complete list.  Confirmed patient has transportation. Gave directions, instructed to utilize valet parking.

## 2024-06-05 ENCOUNTER — Telehealth (HOSPITAL_COMMUNITY): Payer: Self-pay | Admitting: Surgery

## 2024-06-05 NOTE — Telephone Encounter (Signed)
 I attempted to call patient to cancel her appt in the AHF Clinic for the AM.  She did not answer and I left a message to indicate that our team will reach out to reschedule her appt tomorrow.

## 2024-06-06 ENCOUNTER — Encounter (HOSPITAL_COMMUNITY): Admitting: Cardiology

## 2024-06-06 ENCOUNTER — Telehealth (HOSPITAL_COMMUNITY): Payer: Self-pay | Admitting: Cardiology

## 2024-06-24 ENCOUNTER — Ambulatory Visit: Admitting: Nurse Practitioner

## 2024-07-05 ENCOUNTER — Encounter (HOSPITAL_COMMUNITY): Payer: Self-pay | Admitting: Cardiology

## 2024-07-05 ENCOUNTER — Other Ambulatory Visit (HOSPITAL_COMMUNITY): Payer: Self-pay

## 2024-07-05 ENCOUNTER — Ambulatory Visit (HOSPITAL_COMMUNITY)
Admission: RE | Admit: 2024-07-05 | Discharge: 2024-07-05 | Disposition: A | Discharge status: Home / Self Care | Source: Ambulatory Visit | Attending: Cardiology | Admitting: Cardiology

## 2024-07-05 VITALS — BP 132/80 | HR 76 | Ht 65.0 in | Wt 208.0 lb

## 2024-07-05 DIAGNOSIS — Z951 Presence of aortocoronary bypass graft: Secondary | ICD-10-CM | POA: Insufficient documentation

## 2024-07-05 DIAGNOSIS — E119 Type 2 diabetes mellitus without complications: Secondary | ICD-10-CM | POA: Insufficient documentation

## 2024-07-05 DIAGNOSIS — Z79899 Other long term (current) drug therapy: Secondary | ICD-10-CM | POA: Insufficient documentation

## 2024-07-05 DIAGNOSIS — M7989 Other specified soft tissue disorders: Secondary | ICD-10-CM | POA: Diagnosis not present

## 2024-07-05 DIAGNOSIS — I2510 Atherosclerotic heart disease of native coronary artery without angina pectoris: Secondary | ICD-10-CM | POA: Diagnosis not present

## 2024-07-05 DIAGNOSIS — I5032 Chronic diastolic (congestive) heart failure: Secondary | ICD-10-CM | POA: Insufficient documentation

## 2024-07-05 DIAGNOSIS — Z7984 Long term (current) use of oral hypoglycemic drugs: Secondary | ICD-10-CM | POA: Diagnosis not present

## 2024-07-05 DIAGNOSIS — I429 Cardiomyopathy, unspecified: Secondary | ICD-10-CM | POA: Insufficient documentation

## 2024-07-05 DIAGNOSIS — I110 Hypertensive heart disease with heart failure: Secondary | ICD-10-CM | POA: Insufficient documentation

## 2024-07-05 DIAGNOSIS — I440 Atrioventricular block, first degree: Secondary | ICD-10-CM | POA: Diagnosis not present

## 2024-07-05 DIAGNOSIS — I272 Pulmonary hypertension, unspecified: Secondary | ICD-10-CM | POA: Diagnosis present

## 2024-07-05 LAB — CBC
HCT: 38.7 % (ref 36.0–46.0)
Hemoglobin: 12.8 g/dL (ref 12.0–15.0)
MCH: 30 pg (ref 26.0–34.0)
MCHC: 33.1 g/dL (ref 30.0–36.0)
MCV: 90.6 fL (ref 80.0–100.0)
Platelets: 278 K/uL (ref 150–400)
RBC: 4.27 MIL/uL (ref 3.87–5.11)
RDW: 12.8 % (ref 11.5–15.5)
WBC: 4.8 K/uL (ref 4.0–10.5)
nRBC: 0 % (ref 0.0–0.2)

## 2024-07-05 LAB — BASIC METABOLIC PANEL WITH GFR
Anion gap: 9 (ref 5–15)
BUN: 12 mg/dL (ref 8–23)
CO2: 25 mmol/L (ref 22–32)
Calcium: 9.8 mg/dL (ref 8.9–10.3)
Chloride: 105 mmol/L (ref 98–111)
Creatinine, Ser: 0.7 mg/dL (ref 0.44–1.00)
GFR, Estimated: >60 mL/min (ref >60)
Glucose, Bld: 128 mg/dL — ABNORMAL HIGH (ref 70–99)
Potassium: 4.1 mmol/L (ref 3.5–5.1)
Sodium: 139 mmol/L (ref 135–145)

## 2024-07-05 LAB — BRAIN NATRIURETIC PEPTIDE: B Natriuretic Peptide: 159.3 pg/mL — ABNORMAL HIGH (ref 0.0–100.0)

## 2024-07-05 MED ORDER — SPIRONOLACTONE 25 MG PO TABS: 25.0000 mg | ORAL_TABLET | Freq: Every day | ORAL | 3 refills | Status: AC

## 2024-07-05 MED ORDER — FUROSEMIDE 20 MG PO TABS: 20.0000 mg | ORAL_TABLET | ORAL | Status: AC | PRN

## 2024-07-05 NOTE — H&P (View-Only) (Signed)
   ADVANCED HEART FAILURE NEW PATIENT CLINIC NOTE  Referring Physician: Boneta, [place]  E, *  Primary Care: Fulbright, [place]  E, PA-C Primary Cardiologist:  HPI: Katherine Pratt is a 73 y.o. female with a PMH of CAD s/p CABG x4 in 2023, diabetes, morbid obesity, pulmonary hypertension who presents for initial visit for further evaluation and treatment of heart failure/cardiomyopathy.      Patient recently seen in general cardiology clinic in March. At that time she had a chief complaint of worsening shortness of breath and lower extremity swelling.  She was placed on a/small course of Lasix  and had an echocardiogram that showed moderately elevated RVSP and mild aortic stenosis.  She was also seen in the emergency department in May for similar symptoms.     SUBJECTIVE:  Patient reports that she has been having worsening shortness of breath over the past few months.  Her lower extremity edema has improved, though her left leg stays swollen.  She takes Lasix  occasionally, but not all the time.  She gets short of breath with anything more than mild exertion.  She has no prior history of autoimmune disease or rheumatoid arthritis.  She takes the Lasix  a few times a week for lower extremity swelling.  PMH, current medications, allergies, social history, and family history reviewed in epic.  PHYSICAL EXAM: Vitals:   07/05/24 0947  BP: 132/80  Pulse: 76  SpO2: 100%   GENERAL: Well nourished and in no apparent distress at rest.  PULM:  Normal work of breathing, clear to auscultation bilaterally. Respirations are unlabored.  CARDIAC:  JVP: Moderately elevated, pulsatile A waves         Normal rate with regular rhythm.  Systolic murmur  1+ edema. Warm and well perfused extremities. ABDOMEN: Soft, non-tender, non-distended. NEUROLOGIC: Patient is oriented x3 with no focal or lateralizing neurologic deficits.    DATA REVIEW  ECG: 07/05/2024: Significant first-degree AV block, normal  sinus rhythm  ECHO: 03/2024: LVEF 55 to 60%, moderately reduced RV systolic function, severely elevated RVSP, 62.3, moderate LA dilation, mild AS  CATH: 12/2021: Severely calcified multivessel CAD  ASSESSMENT & PLAN:  Pulmonary hypertension: Suspect primarily group II after review of echocardiogram and recent CTA PE, though needs screening for sleep apnea.  After extensive discussion, we will proceed with right heart catheterization to further define pulmonary pressures and etiology. - Right heart catheterization - Consideration of level trial if PCWP elevated - Further workup after right heart catheterization  Chronic diastolic heart failure: Remains mildly volume overloaded on exam, taking her diuretics as needed.  Suspect multifactorial, hypertension as well as prior CAD. - Continue current metoprolol  regimen, 25 mg twice daily - Increase spironolactone  to 25 mg daily - Continue Lasix  20 mg as needed - Previously been on jardiance , restart after cath  Hypertension: Relatively well controlled today.  - Continue management as above  CAD: CABG x 4 (LIMA to LAD, SVG-OM, SVG-OM, SVG-PDA) in 2023. - No anginal symptoms  1st degree AVB: , significantly prolonged  Follow up in 2-3 months  [name], MD Advanced Heart Failure Mechanical Circulatory Support 07/05/24

## 2024-07-05 NOTE — Patient Instructions (Signed)
 INCREASE Spironolactone  to 25 mg daily.  STOP Potassium   Labs done today, your results will be available in MyChart, we will contact you for abnormal readings.   You are scheduled for a Cardiac Catheterization on Tuesday, August 19 with Dr. Odis Brownie.  1. Please arrive at the [facility] (Main Entrance A) at [facility]: [address] [place], [place] [address] at 7:00 AM (This time is 2 hour(s) before your procedure to ensure your preparation).   Free valet parking service is available. You will check in at ADMITTING. The support person will be asked to wait in the waiting room.  It is OK to have someone drop you off and come back when you are ready to be discharged.    Special note: Every effort is made to have your procedure done on time. Please understand that emergencies sometimes delay scheduled procedures.  2. Diet: Light meals may be eaten up to 6 hours before scheduled procedures from 12N and after; please stop eating at 6:00 AM (Light meal consist of plain toast, fruit, light soups, crackers)  3. Medication instructions in preparation for your procedure:   Contrast Allergy: No  HOLD YOUR SPIRONOLACTONE  AND LASIX  THE MORNING OF YOUR PROCEDURE.  On the morning of your procedure, take your any morning medicines NOT listed above.  You may use sips of water.  6. Plan to go home the same day, you will only stay overnight if medically necessary. 7. Bring a current list of your medications and current insurance cards. 8. You MUST have a responsible person to drive you home. 9. Someone MUST be with you the first 24 hours after you arrive home or your discharge will be delayed. 10. Please wear clothes that are easy to get on and off and wear slip-on shoes.  Your physician recommends that you schedule a follow-up appointment in: 2 MONTHS  If you have any questions or concerns before your next appointment please send us  a message through Page or call our office at  [phone].    TO LEAVE A MESSAGE FOR THE NURSE SELECT OPTION 2, PLEASE LEAVE A MESSAGE INCLUDING: YOUR NAME DATE OF BIRTH CALL BACK NUMBER REASON FOR CALL**this is important as we prioritize the call backs  YOU WILL RECEIVE A CALL BACK THE SAME DAY AS LONG AS YOU CALL BEFORE 4:00 PM  At the [facility], you and your health needs are our priority. As part of our continuing mission to provide you with exceptional heart care, we have created designated Provider Care Teams. These Care Teams include your primary Cardiologist (physician) and Advanced Practice Providers (APPs- Physician Assistants and Nurse Practitioners) who all work together to provide you with the care you need, when you need it.   You may see any of the following providers on your designated Care Team at your next follow up: Dr Toribio Fuel Dr Ezra Shuck Dr. Ria Commander Dr. [name] Amy Lenetta, NP Caffie Shed, [place] [facility], [place] Beckey Coe, NP [place] Lee, NP Ellouise Class, NP Tinnie Redman, PharmD Jaun Bash, PharmD   Please be sure to bring in all your medications bottles to every appointment.    Thank you for choosing [facility]

## 2024-07-05 NOTE — Progress Notes (Signed)
   ADVANCED HEART FAILURE NEW PATIENT CLINIC NOTE  Referring Physician: Boneta, [place]  E, *  Primary Care: Fulbright, [place]  E, PA-C Primary Cardiologist:  HPI: Katherine Pratt is a 73 y.o. female with a PMH of CAD s/p CABG x4 in 2023, diabetes, morbid obesity, pulmonary hypertension who presents for initial visit for further evaluation and treatment of heart failure/cardiomyopathy.      Patient recently seen in general cardiology clinic in March. At that time she had a chief complaint of worsening shortness of breath and lower extremity swelling.  She was placed on a/small course of Lasix  and had an echocardiogram that showed moderately elevated RVSP and mild aortic stenosis.  She was also seen in the emergency department in May for similar symptoms.     SUBJECTIVE:  Patient reports that she has been having worsening shortness of breath over the past few months.  Her lower extremity edema has improved, though her left leg stays swollen.  She takes Lasix  occasionally, but not all the time.  She gets short of breath with anything more than mild exertion.  She has no prior history of autoimmune disease or rheumatoid arthritis.  She takes the Lasix  a few times a week for lower extremity swelling.  PMH, current medications, allergies, social history, and family history reviewed in epic.  PHYSICAL EXAM: Vitals:   07/05/24 0947  BP: 132/80  Pulse: 76  SpO2: 100%   GENERAL: Well nourished and in no apparent distress at rest.  PULM:  Normal work of breathing, clear to auscultation bilaterally. Respirations are unlabored.  CARDIAC:  JVP: Moderately elevated, pulsatile A waves         Normal rate with regular rhythm.  Systolic murmur  1+ edema. Warm and well perfused extremities. ABDOMEN: Soft, non-tender, non-distended. NEUROLOGIC: Patient is oriented x3 with no focal or lateralizing neurologic deficits.    DATA REVIEW  ECG: 07/05/2024: Significant first-degree AV block, normal  sinus rhythm  ECHO: 03/2024: LVEF 55 to 60%, moderately reduced RV systolic function, severely elevated RVSP, 62.3, moderate LA dilation, mild AS  CATH: 12/2021: Severely calcified multivessel CAD  ASSESSMENT & PLAN:  Pulmonary hypertension: Suspect primarily group II after review of echocardiogram and recent CTA PE, though needs screening for sleep apnea.  After extensive discussion, we will proceed with right heart catheterization to further define pulmonary pressures and etiology. - Right heart catheterization - Consideration of level trial if PCWP elevated - Further workup after right heart catheterization  Chronic diastolic heart failure: Remains mildly volume overloaded on exam, taking her diuretics as needed.  Suspect multifactorial, hypertension as well as prior CAD. - Continue current metoprolol  regimen, 25 mg twice daily - Increase spironolactone  to 25 mg daily - Continue Lasix  20 mg as needed - Previously been on jardiance , restart after cath  Hypertension: Relatively well controlled today.  - Continue management as above  CAD: CABG x 4 (LIMA to LAD, SVG-OM, SVG-OM, SVG-PDA) in 2023. - No anginal symptoms  1st degree AVB: , significantly prolonged  Follow up in 2-3 months  [name], MD Advanced Heart Failure Mechanical Circulatory Support 07/05/24

## 2024-07-13 NOTE — Progress Notes (Signed)
 m

## 2024-07-24 NOTE — Progress Notes (Signed)
 " Cardiology Office Note    Patient Name: Katherine Pratt Date of Encounter: 07/24/2024  Primary Care Provider:  Boneta, [place]  E, PA-C Primary Cardiologist:  Lonni Cash, MD Primary Electrophysiologist: None   Past Medical History    Past Medical History:  Diagnosis Date   Aortic atherosclerosis (HCC) 01/09/2022   Chest CTA 12/09/2021: Aortic atherosclerosis   Breast cancer (HCC)    CAD (coronary artery disease) 01/09/2022   NSTEMI s/p CABG in 12/2021 (LIMA-LAD, S-OM1, S-OM2, S-PDA; endoscopic vein harvest from right leg) Pre-CABG Dopplers 12/12/2021: Bilateral ICA 1-39 Echocardiogram 12/11/2021: EF 55-60, no RWMA, mild LVH, normal RVSF, normal PASP, trivial MR   Diabetes mellitus without complication (HCC)    HLD (hyperlipidemia) 08/01/2015   Hypertension     History of Present Illness  Katherine Pratt is a 73 y.o. female with a PMH of CAD s/p NSTEMI and CABG x 4 12/2021, first-degree AVB DM type II, HTN, pulmonary HTN, HLD, morbid obesity, hyperthyroidism, right breast CA s/p lumpectomy with radiation who presents today for 3 month follow up.  Ms. West was last seen on 05/05/2024 following recent hospitalization for CHF exacerbation.  She was also noted to have new onset hyperthyroidism.  She had 2D echo completed that showed elevated RVSP and was referred to heart failure for management of PHT.  She was seen by Dr. Zenaida on 06/26/2024 with RHC ordered to further define pulmonary pressures.  She was also continued on Lasix  and spironolactone  was increased to 25 mg.  Katherine Pratt is a 73 year old female with heart failure and pulmonary hypertension who presents for follow-up after a recent hospitalization for heart failure exacerbation. She was referred by Dr. Zenaida for evaluation of her heart failure and pulmonary hypertension.  She is currently on spironolactone , which was increased. She has experienced recent swelling on her left side, which began two days ago, possibly related  to dietary indiscretion involving fried chicken. Her weight has remained stable around 208 pounds. Her breathing has been stable as long as she avoids exertion, and she has no increase in shortness of breath. She takes Lasix  20 mg as needed and has not taken it since her last visit, except for a three-day course. She has a history of heart block, as noted on a recent EKG, with a prolonged PR interval of 322 ms. She feels fatigued and sometimes needs to rest during tasks, though she does not experience shortness of breath. She is on metoprolol  25 mg twice daily, which was previously increased to manage premature ventricular contractions (PVCs). She is also under the care of a thyroid  specialist. Patient denies chest pain, palpitations, dyspnea, PND, orthopnea, nausea, vomiting, dizziness, syncope, edema, weight gain, or early satiety.  Discussed the use of AI scribe software for clinical note transcription with the patient, who gave verbal consent to proceed.  History of Present Illness   Review of Systems  Please see the history of present illness.    All other systems reviewed and are otherwise negative except as noted above.  Physical Exam    Wt Readings from Last 3 Encounters:  07/05/24 208 lb (94.3 kg)  05/05/24 218 lb 6.4 oz (99.1 kg)  04/10/24 225 lb (102.1 kg)   CD:Uyzmz were no vitals filed for this visit.,There is no height or weight on file to calculate BMI. GEN: Well nourished, well developed in no acute distress Neck: No JVD; No carotid bruits Pulmonary: Clear to auscultation without rales, wheezing or rhonchi  Cardiovascular: Normal rate. Regular  rhythm. Normal S1. Normal S2.   Murmurs: There is no murmur.  ABDOMEN: Soft, non-tender, non-distended EXTREMITIES: Bilateral +1 lower extremity edema  EKG/LABS/ Recent Cardiac Studies   ECG personally reviewed by me today -none completed  Risk Assessment/Calculations:          Lab Results  Component Value Date   WBC 4.8  07/05/2024   HGB 12.8 07/05/2024   HCT 38.7 07/05/2024   MCV 90.6 07/05/2024   PLT 278 07/05/2024   Lab Results  Component Value Date   CREATININE 0.70 07/05/2024   BUN 12 07/05/2024   NA 139 07/05/2024   K 4.1 07/05/2024   CL 105 07/05/2024   CO2 25 07/05/2024   Lab Results  Component Value Date   CHOL 132 03/02/2024   HDL 50 03/02/2024   LDLCALC 70 03/02/2024   TRIG 56 03/02/2024   CHOLHDL 2.6 03/02/2024    Lab Results  Component Value Date   HGBA1C 5.7 (H) 04/10/2024   Assessment & Plan    Assessment & Plan  1.HFpEF/PHT:  -Recent exacerbation due to high-sodium intake. Echocardiogram shows elevated pulmonary pressures. Awaiting heart catheterization - Bilateral lower extremity edema and patient advised to take as needed Lasix  - Continue Lasix  20 mg as needed - Monitor kidney function regularly. - Advise on dietary sodium restriction, avoiding high-sodium takeout foods. - Proceed with heart catheterization as scheduled by advanced heart failure clinic  2.History of CAD: - CABG x 4 with Dr. Lucas with (LIMA-LAD, S-OM1, S-OM2, S-PDA 2023 -Today patient reports no chest pain or angina.  3. Essential hypertension: - Patient's blood pressure today was elevated at 140/60 - Patient advised to continue spironolactone  25 mg-  4.Hyperthyroidism: - Newly diagnosed during recent hospitalization. - Patient is advised and aware to follow-up with endocrinology/PCP  5.  First-degree heart block: EKG shows prolonged PR interval of 322 ms. Symptoms include fatigue and exertional intolerance. Metoprolol  may contribute to heart block. - Reduce metoprolol  to 12.5 mg twice daily. - Follow up with EKGs to monitor heart block progression.  6. Premature ventricular contractions: Managed with metoprolol . Reduction in dose may lead to recurrence. Reports of extra beats during sleep. - Monitor for recurrence of PVCs with reduced metoprolol  dose. - Instruct to report any increase in  PVCs or symptomatic palpitations.  Disposition: Follow-up with Lonni Cash, MD or APP in 3 months   Signed, Wyn Raddle, Jackee Shove, NP 07/24/2024, 1:10 PM [facility] "

## 2024-07-25 ENCOUNTER — Ambulatory Visit: Attending: Nurse Practitioner | Admitting: Nurse Practitioner

## 2024-07-25 ENCOUNTER — Encounter: Payer: Self-pay | Admitting: Nurse Practitioner

## 2024-07-25 VITALS — BP 140/60 | HR 64 | Ht 65.0 in | Wt 208.0 lb

## 2024-07-25 DIAGNOSIS — E7800 Pure hypercholesterolemia, unspecified: Secondary | ICD-10-CM

## 2024-07-25 DIAGNOSIS — E0590 Thyrotoxicosis, unspecified without thyrotoxic crisis or storm: Secondary | ICD-10-CM | POA: Diagnosis not present

## 2024-07-25 DIAGNOSIS — I5032 Chronic diastolic (congestive) heart failure: Secondary | ICD-10-CM

## 2024-07-25 DIAGNOSIS — I493 Ventricular premature depolarization: Secondary | ICD-10-CM

## 2024-07-25 DIAGNOSIS — I2510 Atherosclerotic heart disease of native coronary artery without angina pectoris: Secondary | ICD-10-CM

## 2024-07-25 DIAGNOSIS — I272 Pulmonary hypertension, unspecified: Secondary | ICD-10-CM | POA: Diagnosis not present

## 2024-07-25 NOTE — Patient Instructions (Signed)
 Medication Instructions:  TAKE A DOSE OF LASIX  TODAY *If you need a refill on your cardiac medications before your next appointment, please call your pharmacy*  Lab Work: NONE ORDERED If you have labs (blood work) drawn today and your tests are completely normal, you will receive your results only by: MyChart Message (if you have MyChart) OR A paper copy in the mail If you have any lab test that is abnormal or we need to change your treatment, we will call you to review the results.  Testing/Procedures: NONE ORDERED  Follow-Up: At [facility], you and your health needs are our priority.  As part of our continuing mission to provide you with exceptional heart care, our providers are all part of one team.  This team includes your primary Cardiologist (physician) and Advanced Practice Providers or APPs (Physician Assistants and Nurse Practitioners) who all work together to provide you with the care you need, when you need it.  Your next appointment:   2 month(s)  Provider:   Lonni Cash, MD    We recommend signing up for the patient portal called MyChart.  Sign up information is provided on this After Visit Summary.  MyChart is used to connect with patients for Virtual Visits (Telemedicine).  Patients are able to view lab/test results, encounter notes, upcoming appointments, etc.  Non-urgent messages can be sent to your provider as well.   To learn more about what you can do with MyChart, go to [email or web address].   Other Instructions

## 2024-07-26 ENCOUNTER — Ambulatory Visit (HOSPITAL_COMMUNITY)
Admission: RE | Admit: 2024-07-26 | Discharge: 2024-07-26 | Disposition: A | Discharge status: Home / Self Care | Attending: Cardiology | Admitting: Cardiology

## 2024-07-26 ENCOUNTER — Other Ambulatory Visit: Payer: Self-pay

## 2024-07-26 ENCOUNTER — Encounter (HOSPITAL_COMMUNITY)
Admission: RE | Disposition: A | Discharge status: Home / Self Care | Payer: Self-pay | Source: Home / Self Care | Attending: Cardiology

## 2024-07-26 DIAGNOSIS — E119 Type 2 diabetes mellitus without complications: Secondary | ICD-10-CM | POA: Insufficient documentation

## 2024-07-26 DIAGNOSIS — I440 Atrioventricular block, first degree: Secondary | ICD-10-CM | POA: Diagnosis not present

## 2024-07-26 DIAGNOSIS — I272 Pulmonary hypertension, unspecified: Secondary | ICD-10-CM

## 2024-07-26 DIAGNOSIS — I2510 Atherosclerotic heart disease of native coronary artery without angina pectoris: Secondary | ICD-10-CM | POA: Insufficient documentation

## 2024-07-26 DIAGNOSIS — I5032 Chronic diastolic (congestive) heart failure: Secondary | ICD-10-CM | POA: Insufficient documentation

## 2024-07-26 DIAGNOSIS — I110 Hypertensive heart disease with heart failure: Secondary | ICD-10-CM | POA: Insufficient documentation

## 2024-07-26 DIAGNOSIS — I429 Cardiomyopathy, unspecified: Secondary | ICD-10-CM | POA: Insufficient documentation

## 2024-07-26 DIAGNOSIS — Z6834 Body mass index (BMI) 34.0-34.9, adult: Secondary | ICD-10-CM | POA: Insufficient documentation

## 2024-07-26 DIAGNOSIS — Z951 Presence of aortocoronary bypass graft: Secondary | ICD-10-CM | POA: Diagnosis not present

## 2024-07-26 DIAGNOSIS — Z79899 Other long term (current) drug therapy: Secondary | ICD-10-CM | POA: Insufficient documentation

## 2024-07-26 HISTORY — PX: RIGHT HEART CATH: CATH118263

## 2024-07-26 LAB — GLUCOSE, CAPILLARY
Glucose-Capillary: 107 mg/dL — ABNORMAL HIGH (ref 70–99)
Glucose-Capillary: 104 mg/dL — ABNORMAL HIGH (ref 70–99)

## 2024-07-26 LAB — POCT I-STAT EG7
Acid-base deficit: 1 mmol/L (ref 0.0–2.0)
Acid-base deficit: 1 mmol/L (ref 0.0–2.0)
Acid-base deficit: 3 mmol/L — ABNORMAL HIGH (ref 0.0–2.0)
Acid-base deficit: 3 mmol/L — ABNORMAL HIGH (ref 0.0–2.0)
Bicarbonate: 21.8 mmol/L (ref 20.0–28.0)
Bicarbonate: 22.1 mmol/L (ref 20.0–28.0)
Bicarbonate: 23.9 mmol/L (ref 20.0–28.0)
Bicarbonate: 24.4 mmol/L (ref 20.0–28.0)
Calcium, Ion: 1.08 mmol/L — ABNORMAL LOW (ref 1.15–1.40)
Calcium, Ion: 1.08 mmol/L — ABNORMAL LOW (ref 1.15–1.40)
Calcium, Ion: 1.28 mmol/L (ref 1.15–1.40)
Calcium, Ion: 1.29 mmol/L (ref 1.15–1.40)
HCT: 29 % — ABNORMAL LOW (ref 36.0–46.0)
HCT: 30 % — ABNORMAL LOW (ref 36.0–46.0)
HCT: 32 % — ABNORMAL LOW (ref 36.0–46.0)
HCT: 33 % — ABNORMAL LOW (ref 36.0–46.0)
Hemoglobin: 10.2 g/dL — ABNORMAL LOW (ref 12.0–15.0)
Hemoglobin: 10.9 g/dL — ABNORMAL LOW (ref 12.0–15.0)
Hemoglobin: 11.2 g/dL — ABNORMAL LOW (ref 12.0–15.0)
Hemoglobin: 9.9 g/dL — ABNORMAL LOW (ref 12.0–15.0)
O2 Saturation: 80 %
O2 Saturation: 80 %
O2 Saturation: 82 %
O2 Saturation: 82 %
Potassium: 3.3 mmol/L — ABNORMAL LOW (ref 3.5–5.1)
Potassium: 3.3 mmol/L — ABNORMAL LOW (ref 3.5–5.1)
Potassium: 3.7 mmol/L (ref 3.5–5.1)
Potassium: 3.8 mmol/L (ref 3.5–5.1)
Sodium: 141 mmol/L (ref 135–145)
Sodium: 142 mmol/L (ref 135–145)
Sodium: 145 mmol/L (ref 135–145)
Sodium: 145 mmol/L (ref 135–145)
TCO2: 23 mmol/L (ref 22–32)
TCO2: 23 mmol/L (ref 22–32)
TCO2: 25 mmol/L (ref 22–32)
TCO2: 26 mmol/L (ref 22–32)
pCO2, Ven: 36.6 mmHg — ABNORMAL LOW (ref 44–60)
pCO2, Ven: 37 mmHg — ABNORMAL LOW (ref 44–60)
pCO2, Ven: 39.2 mmHg — ABNORMAL LOW (ref 44–60)
pCO2, Ven: 40.6 mmHg — ABNORMAL LOW (ref 44–60)
pH, Ven: 7.383 (ref 7.25–7.43)
pH, Ven: 7.383 (ref 7.25–7.43)
pH, Ven: 7.387 (ref 7.25–7.43)
pH, Ven: 7.392 (ref 7.25–7.43)
pO2, Ven: 45 mmHg (ref 32–45)
pO2, Ven: 45 mmHg (ref 32–45)
pO2, Ven: 47 mmHg — ABNORMAL HIGH (ref 32–45)
pO2, Ven: 47 mmHg — ABNORMAL HIGH (ref 32–45)

## 2024-07-26 SURGERY — RIGHT HEART CATH
Anesthesia: LOCAL

## 2024-07-26 MED ORDER — LIDOCAINE HCL (PF) 1 % IJ SOLN
INTRAMUSCULAR | Status: DC | PRN
  Administered 2024-07-26: 2 mL via INTRADERMAL

## 2024-07-26 MED ORDER — LIDOCAINE HCL (PF) 1 % IJ SOLN
INTRAMUSCULAR | Status: AC
  Filled 2024-07-26: qty 30

## 2024-07-26 MED ORDER — SODIUM CHLORIDE 0.9 % IV SOLN: 250.0000 mL | INTRAVENOUS | Status: DC | PRN

## 2024-07-26 MED ORDER — FREE WATER: 250.0000 mL | Freq: Once | Status: DC

## 2024-07-26 MED ORDER — SODIUM CHLORIDE 0.9% FLUSH: 3.0000 mL | INTRAVENOUS | Status: DC | PRN

## 2024-07-26 MED ORDER — HEPARIN (PORCINE) IN NACL 1000-0.9 UT/500ML-% IV SOLN
INTRAVENOUS | Status: DC | PRN
  Administered 2024-07-26: 500 mL

## 2024-07-26 MED ORDER — SODIUM CHLORIDE 0.9% FLUSH: 3.0000 mL | Freq: Two times a day (BID) | INTRAVENOUS | Status: DC

## 2024-07-26 SURGICAL SUPPLY — 5 items
CATH SWAN GANZ 7F STRAIGHT (CATHETERS) IMPLANT
GLIDESHEATH SLENDER 7FR .021G (SHEATH) IMPLANT
PACK CARDIAC CATHETERIZATION (CUSTOM PROCEDURE TRAY) ×1 IMPLANT
TRANSDUCER W/STOPCOCK (MISCELLANEOUS) IMPLANT
TUBING ART PRESS 72 MALE/FEM (TUBING) IMPLANT

## 2024-07-26 NOTE — Discharge Instructions (Signed)

## 2024-07-26 NOTE — Interval H&P Note (Signed)
 History and Physical Interval Note:  07/26/2024 9:00 AM  Katherine Pratt  has presented today for surgery, with the diagnosis of chf - hp.  The various methods of treatment have been discussed with the patient and family. After consideration of risks, benefits and other options for treatment, the patient has consented to  Procedure(s): RIGHT HEART CATH (N/A) as a surgical intervention.  The patient's history has been reviewed, patient examined, no change in status, stable for surgery.  I have reviewed the patient's chart and labs.  Questions were answered to the patient's satisfaction.     Morene JINNY Brownie

## 2024-07-26 NOTE — Progress Notes (Signed)
 Arrived to short stay. Left brachial site dry/intact with no bleeding or hematoma noted. Denies any distress at this time. Will continue to monitor per orders and protocol.Katherine Pratt E

## 2024-07-27 ENCOUNTER — Encounter (HOSPITAL_COMMUNITY): Payer: Self-pay | Admitting: Cardiology

## 2024-08-29 ENCOUNTER — Telehealth (HOSPITAL_COMMUNITY): Payer: Self-pay | Admitting: Cardiology

## 2024-08-29 NOTE — Telephone Encounter (Signed)
 Called to confirm/remind patient of their appointment at the [facility] on 08/29/2024.   Appointment:   [x] Confirmed  [] Left mess   [] No answer/No voice mail  [] VM Full/unable to leave message  [] Phone not in service  Patient reminded to bring all medications and/or complete list.  Confirmed patient has transportation. Gave directions, instructed to utilize valet parking.

## 2024-08-30 ENCOUNTER — Ambulatory Visit (HOSPITAL_COMMUNITY)
Admission: RE | Admit: 2024-08-30 | Discharge: 2024-08-30 | Disposition: A | Discharge status: Home / Self Care | Source: Ambulatory Visit | Attending: Cardiology | Admitting: Cardiology

## 2024-08-30 ENCOUNTER — Encounter (HOSPITAL_COMMUNITY): Payer: Self-pay | Admitting: Cardiology

## 2024-08-30 VITALS — BP 154/82 | HR 64 | Ht 65.0 in | Wt 211.0 lb

## 2024-08-30 DIAGNOSIS — I5032 Chronic diastolic (congestive) heart failure: Secondary | ICD-10-CM | POA: Diagnosis present

## 2024-08-30 NOTE — Progress Notes (Signed)
   ADVANCED HEART FAILURE FOLLOW UP CLINIC NOTE  Referring Physician: Boneta, [place]  E, *  Primary Care: Fulbright, [place]  E, PA-C Primary Cardiologist:  HPI: Katherine Pratt is a 73 y.o. female who presents for follow up of pulmonary hypertension.      Patient recently seen in general cardiology clinic in March. At that time she had a chief complaint of worsening shortness of breath and lower extremity swelling.  She was placed on a/small course of Lasix  and had an echocardiogram that showed moderately elevated RVSP and mild aortic stenosis.  She was also seen in the emergency department in May for similar symptoms.      SUBJECTIVE:  I discussed her recent right heart catheterization test results.  Noted that she probably has group 3 PH as well as a component of high-output heart disease from her hyperthyroidism.  She overall ports that she is feeling well and has felt much better since starting on therapy for her thyroid .  PMH, current medications, allergies, social history, and family history reviewed in epic.  PHYSICAL EXAM: Vitals:   08/30/24 1035  BP: (!) 154/82  Pulse: 64  SpO2: 100%   .GENERAL: NAD, well appearing PULM:  Normal work of breathing, CTAB CARDIAC:  JVP: flat         Normal rate with regular rhythm. No murmurs, rubs or gallops.  Trace edema. Warm and well perfused extremities. ABDOMEN: Soft, non-tender, non-distended. NEUROLOGIC: Patient is oriented x3 with no focal or lateralizing neurologic deficits.     DATA REVIEW  ECG: 07/05/2024: Significant first-degree AV block, normal sinus rhythm   ECHO: 03/2024: LVEF 55 to 60%, moderately reduced RV systolic function, severely elevated RVSP, 62.3, moderate LA dilation, mild AS   CATH: 12/2021: Severely calcified multivessel CAD 07/26/2024: RA 4, PA 41/16 (26), PCWP 10, TD CO/CI 8.76/4.36     ASSESSMENT & PLAN:  Pulmonary hypertension: Normal filling pressure, high output heart failure/group III PH.  Overall feeling well, minimal symptoms. Suspect this should largely improved with OSA management and treatment of her hyperthyroid. - Follow up prn   Chronic diastolic heart failure: Appears euvolemic today.  Suspect multifactorial, hypertension as well as prior CAD. - Continue metoprolol  25mg  daily - Continue spironolactone  25mg  daily - Continue lasix  20mg  as needed - Can consider restarting empagliflozin  10mg  daily   Hypertension: Relatively well controlled today.  - Continue management as above   CAD: CABG x 4 (LIMA to LAD, SVG-OM, SVG-OM, SVG-PDA) in 2023. - No anginal symptoms   1st degree AVB: , significantly prolonged  Graves disease: - Follows with endo - Continue methimazole 10mg  daily  Follow up as needed  [name], MD Advanced Heart Failure Mechanical Circulatory Support 09/06/24

## 2024-08-30 NOTE — Patient Instructions (Addendum)
 Congratulations you have graduated from the heart failure clinic.!!!  There has been no changes to your medications.  You have been referred to the Pulmonologist for a sleep stud test. They will call you to arrange this test.  Your physician recommends that you schedule a follow-up appointment in: as needed.  If you have any questions or concerns before your next appointment please send us  a message through [place] or call our office at [phone].    TO LEAVE A MESSAGE FOR THE NURSE SELECT OPTION 2, PLEASE LEAVE A MESSAGE INCLUDING: YOUR NAME DATE OF BIRTH CALL BACK NUMBER REASON FOR CALL**this is important as we prioritize the call backs  YOU WILL RECEIVE A CALL BACK THE SAME DAY AS LONG AS YOU CALL BEFORE 4:00 PM  At the [facility], you and your health needs are our priority. As part of our continuing mission to provide you with exceptional heart care, we have created designated Provider Care Teams. These Care Teams include your primary Cardiologist (physician) and Advanced Practice Providers (APPs- Physician Assistants and Nurse Practitioners) who all work together to provide you with the care you need, when you need it.   You may see any of the following providers on your designated Care Team at your next follow up: Dr Toribio Fuel Dr Ezra Shuck Dr. Ria Commander Dr. [name] Amy Lenetta, NP Caffie Shed, [place], [place] Beckey Coe, NP [place] Lee, NP Ellouise Class, NP Tinnie Redman, PharmD Jaun Bash, PharmD   Please be sure to bring in all your medications bottles to every appointment.    Thank you for choosing [facility]

## 2024-10-13 ENCOUNTER — Ambulatory Visit: Admitting: Cardiovascular Disease

## 2024-10-20 ENCOUNTER — Encounter: Payer: Self-pay | Admitting: Cardiology

## 2024-10-20 NOTE — Progress Notes (Signed)
 Cardiology Office Note   Date:  10/21/2024  ID:  Katherine Pratt, DOB 03/13/1951, MRN [id number] PCP: [place], [place]  E, PA-C  [facility] Providers Cardiologist:  Katherine Cash, MD     History of Present Illness Katherine Pratt is a 73 y.o. female with a past medical history of HFpEF, CAD s/p CABG x 4, pulmonary hypertension, hypertension, PVCs, DM2, history of breast cancer, dyslipidemia, hyperthyroidism, aortic valve stenosis, mild bilateral carotid artery stenosis  07/26/2024 right heart cath suspected mild group 3 pulmonary hypertension, recommendations for sleep study and PFTs 04/04/2024 echo EF 55 to 60%, moderate LVH, severely elevated PASP at 62.3 mmHg, LA moderately dilated, moderate thickening of aortic valve with mild aortic valve stenosis 2023 CABG x 4 LIMA to LAD, SVG to OM, SVG to OM, SVG to PDA 12/12/2021 carotid duplex mild bilateral carotid artery stenosis  She was admitted to the hospital on 12/09/2021 to 12/20/2021 with a non-STEMI, echo revealed a preserved EF, she underwent left heart cath revealing three-vessel CAD and subsequently underwent CABG x 4 with LIMA to LAD, SVG to OM1, SVG to OM 2 and SVG to PDA, her hospitalization was overall unremarkable.  She established with our office following this admission.  In March 2025 she was evaluated by Katherine Alberts NP, she was bothered by shortness of breath that seem to be worsening as well as pedal edema.  An echocardiogram was arranged revealing severely elevated PASP at 62.3 mmHg and she was ultimately referred to the advanced heart failure clinic.  She was evaluated by Dr. Zenaida on 07/05/2024, right heart cath was arranged revealing group 3 pulmonary hypertension as well as component of high-output heart disease secondary to hyperthyroidism.  Most recently evaluated by Dr. Zenaida 08/30/2024, she was feeling well, workup ongoing for OSA which was felt to be a contributing factor to her symptoms and she was advised she can  follow-up on a as needed basis with the advanced heart failure clinic, referred to pulmonology.  She presents today for follow up, she is doing well, no formal complaints. She is active around her home with chores, watches her 2 yo great grandson. Discussed recommendations to see pulmonology by Dr. Zenaida, she is waiting to hear from their office, but feels she does not have sleep apnea. She denies chest pain, palpitations, dyspnea, pnd, orthopnea, n, v, dizziness, syncope, edema, weight gain, or early satiety.   ROS: Review of Systems  Cardiovascular:  Positive for leg swelling.  All other systems reviewed and are negative.    Studies Reviewed      Cardiac Studies & Procedures   ______________________________________________________________________________________________ CARDIAC CATHETERIZATION  CARDIAC CATHETERIZATION 07/26/2024  Conclusion HEMODYNAMICS: RA:       4 mmHg (mean) RV:       41/1, 4 mmHg PA:       41/16 mmHg (26 mean) PCWP: 10 mmHg (mean)  Estimated Fick CO/CI   8.53L/min, 4.24L/min/m2 Thermodilution CO/CI   8.76L/min, 4.36L/min/m2  TPG  16  mmHg PVR  <2 Wood Units PAPi  6.2   IMPRESSION: Right heart catheterization for evaluation of pulmonary hypertension and RV dysfunction Normal left and right heart filling pressures Supranormal cardiac output by TD Mild precapillary PH  RECOMMENDATIONS: Workup for elevated cardiac output Suspect mild group III PH. Will need sleep study and PFTs   CARDIAC CATHETERIZATION  CARDIAC CATHETERIZATION 12/11/2021  Conclusion   Prox RCA lesion is 95% stenosed.   Mid RCA lesion is 70% stenosed.   Dist RCA lesion is 50% stenosed.  1st RPL lesion is 80% stenosed.   1st Mrg-1 lesion is 90% stenosed.   1st Mrg-2 lesion is 80% stenosed.   3rd Mrg lesion is 95% stenosed.   Mid LAD-1 lesion is 90% stenosed.   Prox LAD to Mid LAD lesion is 50% stenosed.   Mid LAD-2 lesion is 30% stenosed.   Dist LAD-2 lesion is 40%  stenosed.   Dist LAD-1 lesion is 50% stenosed.   The left ventricular systolic function is normal.   LV end diastolic pressure is normal.   The left ventricular ejection fraction is 50-55% by visual estimate.  Severe multivessel CAD with coronary calcification and high-grade stenoses in all 3 major coronary arteries.  Preserved global LV contractility with EF estimate at 55% with subtle mid anterolateral hypocontractility.  LVEDP 15 mmHg.  RECOMMENDATION: Surgical consultation for consideration of CABG revascularization.  Increase anti-ischemic medical regimen.  Aggressive lipid-lowering therapy.  Findings Coronary Findings Diagnostic  Dominance: Right  Left Anterior Descending Prox LAD to Mid LAD lesion is 50% stenosed. Mid LAD-1 lesion is 90% stenosed. Mid LAD-2 lesion is 30% stenosed. Dist LAD-1 lesion is 50% stenosed. Dist LAD-2 lesion is 40% stenosed.  First Diagonal Branch Vessel is small in size.  Left Circumflex  First Obtuse Marginal Branch 1st Mrg-1 lesion is 90% stenosed. 1st Mrg-2 lesion is 80% stenosed.  Lateral First Obtuse Marginal Branch Vessel is small in size.  Third Obtuse Marginal Branch 3rd Mrg lesion is 95% stenosed.  Right Coronary Artery Prox RCA lesion is 95% stenosed. Mid RCA lesion is 70% stenosed. Dist RCA lesion is 50% stenosed.  First Right Posterolateral Branch Vessel is large in size. 1st RPL lesion is 80% stenosed.  Intervention  No interventions have been documented.     ECHOCARDIOGRAM  ECHOCARDIOGRAM COMPLETE 04/04/2024  Narrative ECHOCARDIOGRAM REPORT    Patient Name:   Katherine Pratt  Date of Exam: 04/04/2024 Medical Rec #:  [id number]     Height:       65.0 in Accession #:    [id number]    Weight:       232.2 lb Date of Birth:  1951-10-07    BSA:          2.108 m Patient Age:    72 years      BP:           122/68 mmHg Patient Gender: F             HR:           65 bpm. Exam Location:  [address]  Procedure: 2D  Echo, Cardiac Doppler and Color Doppler (Both Spectral and Color Flow Doppler were utilized during procedure).  Indications:    R06.00 SOB  History:        Patient has prior history of Echocardiogram examinations, most recent 12/11/2021. CAD, Arrythmias:PVC, Signs/Symptoms:Shortness of Breath; Risk Factors:Obesity, Hypertension, Diabetes and Dyslipidemia.  Sonographer:    Elsie Bohr RDCS Referring Phys: [phone] Katherine Pratt, JR DICK  IMPRESSIONS   1. Left ventricular ejection fraction, by estimation, is 55 to 60%. The left ventricle has normal function. The left ventricle has no regional wall motion abnormalities. There is moderate left ventricular hypertrophy. Left ventricular diastolic parameters are indeterminate. 2. Right ventricular systolic function is moderately reduced. The right ventricular size is normal. There is severely elevated pulmonary artery systolic pressure. The estimated right ventricular systolic pressure is 62.3 mmHg. 3. Left atrial size was moderately dilated. 4. The mitral valve is normal in structure. Trivial  mitral valve regurgitation. No evidence of mitral stenosis. 5. The aortic valve has an indeterminant number of cusps. There is moderate calcification of the aortic valve. There is moderate thickening of the aortic valve. Aortic valve regurgitation is not visualized. Mild aortic valve stenosis. 6. The inferior vena cava is dilated in size with <50% respiratory variability, suggesting right atrial pressure of 15 mmHg.  Comparison(s): Changes from prior study are noted. Severely elevated pulmonary now present, was normal in 2023.  FINDINGS Left Ventricle: Left ventricular ejection fraction, by estimation, is 55 to 60%. The left ventricle has normal function. The left ventricle has no regional wall motion abnormalities. The left ventricular internal cavity size was normal in size. There is moderate left ventricular hypertrophy. Left ventricular diastolic parameters  are indeterminate.  Right Ventricle: The right ventricular size is normal. Right vetricular wall thickness was not well visualized. Right ventricular systolic function is moderately reduced. There is severely elevated pulmonary artery systolic pressure. The tricuspid regurgitant velocity is 3.44 m/s, and with an assumed right atrial pressure of 15 mmHg, the estimated right ventricular systolic pressure is 62.3 mmHg.  Left Atrium: Left atrial size was moderately dilated.  Right Atrium: Right atrial size was normal in size.  Pericardium: There is no evidence of pericardial effusion.  Mitral Valve: The mitral valve is normal in structure. Trivial mitral valve regurgitation. No evidence of mitral valve stenosis.  Tricuspid Valve: The tricuspid valve is normal in structure. Tricuspid valve regurgitation is mild . No evidence of tricuspid stenosis.  Aortic Valve: The aortic valve has an indeterminant number of cusps. There is moderate calcification of the aortic valve. There is moderate thickening of the aortic valve. Aortic valve regurgitation is not visualized. Mild aortic stenosis is present. Aortic valve mean gradient measures 10.0 mmHg. Aortic valve peak gradient measures 19.6 mmHg. Aortic valve area, by VTI measures 1.40 cm.  Pulmonic Valve: The pulmonic valve was not well visualized. Pulmonic valve regurgitation is trivial. No evidence of pulmonic stenosis.  Aorta: The aortic root, ascending aorta, aortic arch and descending aorta are all structurally normal, with no evidence of dilitation or obstruction.  Venous: The inferior vena cava is dilated in size with less than 50% respiratory variability, suggesting right atrial pressure of 15 mmHg.  IAS/Shunts: The atrial septum is grossly normal.   LEFT VENTRICLE PLAX 2D LVIDd:         4.75 cm   Diastology LVIDs:         3.30 cm   LV Pratt' medial:    8.00 cm/s LV PW:         1.20 cm   LV Pratt/Pratt' medial:  22.0 LV IVS:        1.40 cm   LV Pratt'  lateral:   15.00 cm/s LVOT diam:     1.90 cm   LV Pratt/Pratt' lateral: 11.7 LV SV:         71 LV SV Index:   33 LVOT Area:     2.84 cm   RIGHT VENTRICLE            IVC RV S prime:     7.72 cm/s  IVC diam: 2.30 cm TAPSE (M-mode): 1.0 cm RVSP:           50.3 mmHg  LEFT ATRIUM             Index        RIGHT ATRIUM           Index LA diam:  4.90 cm 2.32 cm/m   RA Pressure: 3.00 mmHg LA Vol (A2C):   73.8 ml 35.01 ml/m  RA Area:     19.80 cm LA Vol (A4C):   67.3 ml 31.93 ml/m  RA Volume:   54.90 ml  26.05 ml/m LA Biplane Vol: 72.2 ml 34.26 ml/m AORTIC VALVE AV Area (Vmax):    1.37 cm AV Area (Vmean):   1.47 cm AV Area (VTI):     1.40 cm AV Vmax:           221.50 cm/s AV Vmean:          145.500 cm/s AV VTI:            0.504 m AV Peak Grad:      19.6 mmHg AV Mean Grad:      10.0 mmHg LVOT Vmax:         107.00 cm/s LVOT Vmean:        75.500 cm/s LVOT VTI:          0.249 m LVOT/AV VTI ratio: 0.49  AORTA Ao Root diam: 2.80 cm Ao Asc diam:  2.90 cm  MITRAL VALVE                TRICUSPID VALVE MV Area (PHT): 4.74 cm     TR Peak grad:   47.3 mmHg MV Decel Time: 160 msec     TR Vmax:        344.00 cm/s MV Pratt velocity: 176.00 cm/s  Estimated RAP:  3.00 mmHg MV A velocity: 102.00 cm/s  RVSP:           50.3 mmHg MV Pratt/A ratio:  1.73 SHUNTS Systemic VTI:  0.25 m Systemic Diam: 1.90 cm  Katherine Bruckner MD Electronically signed by Katherine Bruckner MD Signature Date/Time: 04/04/2024/4:54:52 PM    Final   TEE  ECHO INTRAOPERATIVE TEE 12/13/2021  Narrative *INTRAOPERATIVE TRANSESOPHAGEAL REPORT *    Patient Name:   Katherine Pratt Date of Exam: 12/13/2021 Medical Rec #:  [id number]    Height:       65.0 in Accession #:    [id number]   Weight:       242.1 lb Date of Birth:  Jun 15, 1951   BSA:          2.15 m Patient Age:    70 years     BP:           127/56 mmHg Patient Gender: F            HR:           65 bpm. Exam Location:  Inpatient  Transesophogeal exam  was perform intraoperatively during surgical procedure. Patient was closely monitored under general anesthesia during the entirety of examination.  Indications:     I25.110 Atherosclerotic heart disease of native coronary artery with unstable angina pectoris Performing Phys: [id number] DORISE POUR BARTLE Diagnosing Phys: Pratt Custard MD  Complications: No known complications during this procedure. POST-OP IMPRESSIONS _ Left Ventricle: has normal systolic function, with an ejection fraction of 60%. The cavity size was normal. The wall motion is normal. _ Right Ventricle: normal function. The cavity was normal. The wall motion is normal. _ Aorta: there is no dissection present in the aorta. _ Aortic Valve: The aortic valve appears unchanged from pre-bypass. _ Mitral Valve: The mitral valve appears unchanged from pre-bypass. _ Tricuspid Valve: The tricuspid valve appears unchanged from pre-bypass.  PRE-OP FINDINGS Left Ventricle: The left ventricle has normal systolic function, with  an ejection fraction of 55-60%. The cavity size was normal. There is mild concentric left ventricular hypertrophy.   Right Ventricle: The right ventricle has normal systolic function. The cavity was normal. There is no increase in right ventricular wall thickness.  Left Atrium: Left atrial size was normal in size. No left atrial/left atrial appendage thrombus was detected.  Right Atrium: Right atrial size was normal in size.  Interatrial Septum: No atrial level shunt detected by color flow Doppler. There is no evidence of a patent foramen ovale.  Pericardium: There is no evidence of pericardial effusion.  Mitral Valve: The mitral valve is normal in structure. Mitral valve regurgitation is trivial by color flow Doppler. The MR jet is centrally-directed. There is No evidence of mitral stenosis. Small mobile echodensity on the LA side of the posterior (P1) leaflet likely representing ruptured tertiary  chord.  Tricuspid Valve: The tricuspid valve was normal in structure. Tricuspid valve regurgitation is mild by color flow Doppler. The jet is directed centrally. No evidence of tricuspid stenosis is present.  Aortic Valve: The aortic valve is tricuspid Aortic valve regurgitation was not visualized by color flow Doppler. There is mild stenosis of the aortic valve, with a calculated valve area of 1.59 cm. There is mild thickening and mild calcification present on the aortic valve right coronary, left coronary and non-coronary cusps with mildly decreased mobility.  Pulmonic Valve: The pulmonic valve was normal in structure No evidence of pumonic stenosis. Pulmonic valve regurgitation is trivial by color flow Doppler.   Aorta: There is evidence of a dissection in the none.  Shunts: There is no evidence of an atrial septal defect.  +--------------+--------++ LEFT VENTRICLE         +--------------+--------++ PLAX 2D                +--------------+--------++ LVOT diam:    1.80 cm  +--------------+--------++ LVOT Area:    2.54 cm +--------------+--------++                        +--------------+--------++  +------------------+------------++ AORTIC VALVE                   +------------------+------------++ AV Area (Vmax):   2.06 cm     +------------------+------------++ AV Area (Vmean):  1.94 cm     +------------------+------------++ AV Area (VTI):    1.59 cm     +------------------+------------++ AV Vmax:          168.00 cm/s  +------------------+------------++ AV Vmean:         115.000 cm/s +------------------+------------++ AV VTI:           0.399 m      +------------------+------------++ AV Peak Grad:     11.3 mmHg    +------------------+------------++ AV Mean Grad:     6.0 mmHg     +------------------+------------++ LVOT Vmax:        136.00 cm/s  +------------------+------------++ LVOT Vmean:       87.500  cm/s  +------------------+------------++ LVOT VTI:         0.249 m      +------------------+------------++ LVOT/AV VTI ratio:0.62         +------------------+------------++  +-------------+---------++ MITRAL VALVE           +--------------+-------+ +-------------+---------++ SHUNTS                MV Peak grad:3.0 mmHg  +--------------+-------+ +-------------+---------++ Systemic VTI: 0.25 m  MV Mean grad:1.0 mmHg  +--------------+-------+ +-------------+---------++ Systemic Diam:1.80 cm MV  Vmax:     0.86 m/s  +--------------+-------+ +-------------+---------++ MV Vmean:    41.3 cm/s +-------------+---------++ MV VTI:      0.22 m    +-------------+---------++   Katherine Custard MD Electronically signed by Katherine Custard MD Signature Date/Time: 01/14/2022/3:59:38 PM    Final  MONITORS  LONG TERM MONITOR (3-14 DAYS) 01/24/2022  Narrative Patch Wear Time:  3 days and 0 hours (2023-02-08T12:03:37-0500 to 2023-02-11T12:25:23-0500)  Sinus rhythm. (Minimum heart rate 49 bpm, maximum heart rate 138 bpm). First Degree AV Block was present. 1 run of Supraventricular Tachycardia occurred lasting 13.7 secs with a max rate of 138 bpm. Rare premature atrial contractions. Frequent premature ventricular contractions (6.3%, 19631), Couplets were rare (<1.0%, 11), and no VE Triplets were present. Ventricular Bigeminy and Trigeminy were present.       ______________________________________________________________________________________________      Risk Assessment/Calculations           Physical Exam VS:  BP 138/60   Pulse 66   Ht 5' 5 (1.651 m)   Wt 213 lb 12.8 oz (97 kg)   SpO2 99%   BMI 35.58 kg/m        Wt Readings from Last 3 Encounters:  10/21/24 213 lb 12.8 oz (97 kg)  08/30/24 211 lb 0.3 oz (95.7 kg)  07/26/24 208 lb (94.3 kg)    GEN: Well nourished, well developed in no acute distress NECK: No JVD; No carotid  bruits CARDIAC: RRR, 2/6 systolic murmur LSB, rubs, gallops RESPIRATORY:  Clear to auscultation without rales, wheezing or rhonchi  ABDOMEN: Soft, non-tender, non-distended EXTREMITIES:  No edema; No deformity   ASSESSMENT AND PLAN HFpEF/pulmonary hypertension- NYHA class I-II, euvolemic, continue Lasix  20 mg as needed, metoprolol  25 mg in the morning and 50 mg in the evening, spironolactone  25 mg daily.  Evaluated by Dr. Zenaida and advised to follow-up with heart failure clinic on as-needed basis, referred to pulmonology.   CAD - Stable with no anginal symptoms. No indication for ischemic evaluation.  Continue aspirin  81 mg daily, amlodipine  10 mg daily, metoprolol  25 in the morning and 50 mg in the evening, NTG prn has not needed, Crestor  20 mg daily. Heart healthy diet and regular cardiovascular exercise encouraged.     Suspected OSA - referred to pulmonology and waiting to hear from them.   Mild aortic valve stenosis-soft murmur noted, asymptomatic.  Hypertension-blood pressure is slightly elevated but controlled, continue Norvasc  10 mg daily, metoprol, spironolactone  25 mg daily.  If her blood pressure remains elevated then we could consider adding ARB.   DM2-most recent A1c well-controlled at 5.7%, on Trulicity       Dispo: 6 mos with Dr. Verlin.   Signed, Katherine JAYSON Hoover, NP

## 2024-10-21 ENCOUNTER — Ambulatory Visit: Attending: Cardiology | Admitting: Cardiology

## 2024-10-21 ENCOUNTER — Encounter: Payer: Self-pay | Admitting: Cardiology

## 2024-10-21 VITALS — BP 138/60 | HR 66 | Ht 65.0 in | Wt 213.8 lb

## 2024-10-21 DIAGNOSIS — I10 Essential (primary) hypertension: Secondary | ICD-10-CM | POA: Diagnosis not present

## 2024-10-21 DIAGNOSIS — E1165 Type 2 diabetes mellitus with hyperglycemia: Secondary | ICD-10-CM | POA: Diagnosis not present

## 2024-10-21 DIAGNOSIS — I350 Nonrheumatic aortic (valve) stenosis: Secondary | ICD-10-CM

## 2024-10-21 DIAGNOSIS — I5032 Chronic diastolic (congestive) heart failure: Secondary | ICD-10-CM | POA: Diagnosis not present

## 2024-10-21 DIAGNOSIS — Z794 Long term (current) use of insulin: Secondary | ICD-10-CM

## 2024-10-21 DIAGNOSIS — I2510 Atherosclerotic heart disease of native coronary artery without angina pectoris: Secondary | ICD-10-CM

## 2024-10-21 DIAGNOSIS — I272 Pulmonary hypertension, unspecified: Secondary | ICD-10-CM

## 2024-10-21 NOTE — Patient Instructions (Signed)
 Medication Instructions:  Your physician recommends that you continue on your current medications as directed. Please refer to the Current Medication list given to you today.  *If you need a refill on your cardiac medications before your next appointment, please call your pharmacy*  Lab Work: None ordered  Testing/Procedures: None ordered  Follow-Up: At [facility], you and your health needs are our priority.  As part of our continuing mission to provide you with exceptional heart care, our providers are all part of one team.  This team includes your primary Cardiologist (physician) and Advanced Practice Providers or APPs (Physician Assistants and Nurse Practitioners) who all work together to provide you with the care you need, when you need it.  Your next appointment:   6 month(s)  Provider:   Lonni Cash, MD     Thank you for choosing [facility]!!    [phone]
